# Patient Record
Sex: Female | Born: 1995
Health system: Southern US, Community
[De-identification: ages and names within clinical notes are randomized; demographics above are authoritative.]

## PROBLEM LIST (undated history)

## (undated) DIAGNOSIS — K219 Gastro-esophageal reflux disease without esophagitis: Secondary | ICD-10-CM

## (undated) DIAGNOSIS — G932 Benign intracranial hypertension: Secondary | ICD-10-CM

## (undated) DIAGNOSIS — IMO0001 Reserved for inherently not codable concepts without codable children: Secondary | ICD-10-CM

## (undated) DIAGNOSIS — K37 Unspecified appendicitis: Secondary | ICD-10-CM

## (undated) DIAGNOSIS — K529 Noninfective gastroenteritis and colitis, unspecified: Secondary | ICD-10-CM

## (undated) DIAGNOSIS — F419 Anxiety disorder, unspecified: Secondary | ICD-10-CM

## (undated) DIAGNOSIS — R109 Unspecified abdominal pain: Secondary | ICD-10-CM

## (undated) DIAGNOSIS — T7840XA Allergy, unspecified, initial encounter: Secondary | ICD-10-CM

## (undated) DIAGNOSIS — N946 Dysmenorrhea, unspecified: Secondary | ICD-10-CM

## (undated) DIAGNOSIS — K5010 Crohn's disease of large intestine without complications: Secondary | ICD-10-CM

## (undated) HISTORY — DX: Unspecified abdominal pain: R10.9

## (undated) HISTORY — DX: Gastro-esophageal reflux disease without esophagitis: K21.9

## (undated) HISTORY — DX: Benign intracranial hypertension: G93.2

## (undated) HISTORY — DX: Allergy, unspecified, initial encounter: T78.40XA

## (undated) HISTORY — DX: Dysmenorrhea, unspecified: N94.6

## (undated) HISTORY — PX: WISDOM TOOTH EXTRACTION: SHX21

---
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
## 2013-04-13 ENCOUNTER — Emergency Department (HOSPITAL_COMMUNITY)
Admission: EM | Admit: 2013-04-13 | Discharge: 2013-04-14 | Disposition: A | Discharge status: Home / Self Care | Payer: BC Managed Care – PPO | Attending: Emergency Medicine | Admitting: Emergency Medicine

## 2013-04-13 ENCOUNTER — Encounter (HOSPITAL_COMMUNITY): Payer: Self-pay | Admitting: *Deleted

## 2013-04-13 DIAGNOSIS — IMO0001 Reserved for inherently not codable concepts without codable children: Secondary | ICD-10-CM | POA: Insufficient documentation

## 2013-04-13 DIAGNOSIS — Z79899 Other long term (current) drug therapy: Secondary | ICD-10-CM | POA: Insufficient documentation

## 2013-04-13 DIAGNOSIS — K219 Gastro-esophageal reflux disease without esophagitis: Secondary | ICD-10-CM | POA: Insufficient documentation

## 2013-04-13 DIAGNOSIS — Z8719 Personal history of other diseases of the digestive system: Secondary | ICD-10-CM | POA: Insufficient documentation

## 2013-04-13 DIAGNOSIS — J029 Acute pharyngitis, unspecified: Secondary | ICD-10-CM | POA: Insufficient documentation

## 2013-04-13 DIAGNOSIS — R5381 Other malaise: Secondary | ICD-10-CM | POA: Insufficient documentation

## 2013-04-13 DIAGNOSIS — J3489 Other specified disorders of nose and nasal sinuses: Secondary | ICD-10-CM | POA: Insufficient documentation

## 2013-04-13 DIAGNOSIS — Z880 Allergy status to penicillin: Secondary | ICD-10-CM | POA: Insufficient documentation

## 2013-04-13 DIAGNOSIS — R51 Headache: Secondary | ICD-10-CM | POA: Insufficient documentation

## 2013-04-13 DIAGNOSIS — R197 Diarrhea, unspecified: Secondary | ICD-10-CM | POA: Insufficient documentation

## 2013-04-13 DIAGNOSIS — R1310 Dysphagia, unspecified: Secondary | ICD-10-CM | POA: Insufficient documentation

## 2013-04-13 DIAGNOSIS — R110 Nausea: Secondary | ICD-10-CM | POA: Insufficient documentation

## 2013-04-13 DIAGNOSIS — B349 Viral infection, unspecified: Secondary | ICD-10-CM

## 2013-04-13 DIAGNOSIS — Z8742 Personal history of other diseases of the female genital tract: Secondary | ICD-10-CM | POA: Insufficient documentation

## 2013-04-13 DIAGNOSIS — Z9089 Acquired absence of other organs: Secondary | ICD-10-CM | POA: Insufficient documentation

## 2013-04-13 DIAGNOSIS — B9789 Other viral agents as the cause of diseases classified elsewhere: Secondary | ICD-10-CM | POA: Insufficient documentation

## 2013-04-13 DIAGNOSIS — R599 Enlarged lymph nodes, unspecified: Secondary | ICD-10-CM | POA: Insufficient documentation

## 2013-04-13 NOTE — ED Notes (Signed)
Pt c/o flu-like sxs including generalized body aches, headache, sore throat, cough, runny nose.

## 2013-04-14 MED ORDER — HYDROCODONE-ACETAMINOPHEN 5-325 MG PO TABS: 1.0000 | ORAL_TABLET | ORAL | Status: DC | PRN

## 2013-04-14 MED ORDER — ONDANSETRON 8 MG PO TBDP: 8.0000 mg | ORAL_TABLET | Freq: Three times a day (TID) | ORAL | Status: DC | PRN

## 2013-04-14 NOTE — ED Provider Notes (Signed)
History     CSN: [id number]  Arrival date & time 04/13/13  2247   First MD Initiated Contact with Patient 04/14/13 0202      Chief Complaint  Patient presents with  . Influenza    (Consider location/radiation/quality/duration/timing/severity/associated sxs/prior treatment) Patient is a 17 y.o. female presenting with flu symptoms. The history is provided by the patient.  Influenza Presenting symptoms: cough, fatigue, headache, myalgias and sore throat   Presenting symptoms: no diarrhea, no nausea, no shortness of breath and no vomiting   Associated symptoms: no neck stiffness    patient states she's felt bad for last 5 days. She states that she has had sore throat and her muscles of her. She states her throat only hurts when she swallows. She states she has a throbbing frontal headache. She had diarrhea once. She has occasional cough. Mild nausea. She's had a good appetite. She denies possibility of pregnancy. She denies dysuria. No sick contacts. No confusion. Patient does not know if she's had a fever.  Past Medical History  Diagnosis Date  . GERD (gastroesophageal reflux disease)   . Allergy   . Dysmenorrhea   . Abdominal pain, recurrent   . Appendicitis     Past Surgical History  Procedure Laterality Date  . Appendectomy  07-14-11    laparoscopic   . Esophagogastroduodenoscopy  01/28/2012    Procedure: ESOPHAGOGASTRODUODENOSCOPY (EGD);  Surgeon: Oletha Blend, MD;  Location: [place];  Service: Gastroenterology;  Laterality: N/A;  . Wisdom tooth extraction    . Cholecystectomy  02/11/2012    Procedure: LAPAROSCOPIC CHOLECYSTECTOMY WITH INTRAOPERATIVE CHOLANGIOGRAM;  Surgeon: Gwenyth Ober, MD;  Location: [facility];  Service: General;  Laterality: N/A;    Family History  Problem Relation Age of Onset  . Cancer Paternal Aunt     ovarian, kidney  . Cancer Paternal Uncle     colon  . Cancer Maternal Grandmother     breast   . Hypertension Maternal Grandmother   . Hypertension  Paternal Grandmother   . Hyperlipidemia Paternal Grandmother     History  Substance Use Topics  . Smoking status: Never Smoker   . Smokeless tobacco: Never Used  . Alcohol Use: No    OB History   Grav Para Term Preterm Abortions TAB SAB Ect Mult Living                  Review of Systems  Constitutional: Positive for fatigue. Negative for activity change and appetite change.  HENT: Positive for sore throat and sinus pressure. Negative for trouble swallowing and neck stiffness.   Eyes: Negative for pain.  Respiratory: Positive for cough. Negative for chest tightness and shortness of breath.   Cardiovascular: Negative for chest pain and leg swelling.  Gastrointestinal: Negative for nausea, vomiting, abdominal pain and diarrhea.  Genitourinary: Negative for flank pain.  Musculoskeletal: Positive for myalgias. Negative for back pain.  Skin: Negative for rash.  Neurological: Positive for headaches. Negative for weakness and numbness.  Psychiatric/Behavioral: Negative for behavioral problems.    Allergies  Oxycontin and Penicillins  Home Medications   Current Outpatient Rx  Name  Route  Sig  Dispense  Refill  . cetirizine (ZYRTEC) 10 MG tablet   Oral   Take 10 mg by mouth daily as needed for allergies.         Marland Kitchen esomeprazole (NEXIUM) 40 MG capsule   Oral   Take 40 mg by mouth daily before breakfast.         .  Pseudoeph-Doxylamine-DM-APAP (NYQUIL PO)   Oral   Take 15 mLs by mouth at bedtime as needed (for cold symptoms).         Marland Kitchen HYDROcodone-acetaminophen (NORCO/VICODIN) 5-325 MG per tablet   Oral   Take 1 tablet by mouth every 4 (four) hours as needed for pain.   10 tablet   0   . ondansetron (ZOFRAN-ODT) 8 MG disintegrating tablet   Oral   Take 1 tablet (8 mg total) by mouth every 8 (eight) hours as needed for nausea.   10 tablet   0     BP 129/75  Pulse 62  Temp(Src) 98.3 F (36.8 C) (Oral)  Resp 18  SpO2 100%  LMP 04/11/2013  Physical Exam   Nursing note and vitals reviewed. Constitutional: She is oriented to person, place, and time. She appears well-developed and well-nourished.  HENT:  Head: Normocephalic and atraumatic.  Mouth/Throat: No oropharyngeal exudate.  Posterior pharyngeal erythema and some swelling. No exudate. Uvula midline  Eyes: EOM are normal. Pupils are equal, round, and reactive to light.  Neck: Normal range of motion. Neck supple.  No meningeal signs.  Cardiovascular: Normal rate, regular rhythm and normal heart sounds.   No murmur heard. Pulmonary/Chest: Effort normal and breath sounds normal. No respiratory distress. She has no wheezes. She has no rales.  Abdominal: Soft. Bowel sounds are normal. She exhibits no distension. There is no tenderness. There is no rebound and no guarding.  Musculoskeletal: Normal range of motion.  Lymphadenopathy:    She has cervical adenopathy.  Neurological: She is alert and oriented to person, place, and time. No cranial nerve deficit.  Skin: Skin is warm and dry.  Psychiatric: She has a normal mood and affect. Her speech is normal.    ED Course  Procedures (including critical care time)  Labs Reviewed  RAPID STREP SCREEN  CULTURE, GROUP A STREP   No results found.   1. Viral infection       MDM  Patient presents with sore throat and myalgias. Strep is negative. She is well-appearing. She'll be discharged home. She denies possibility of pregnancy.        Jasper Riling. Alvino Chapel, MD 04/14/13 [phone]

## 2013-04-16 LAB — CULTURE, GROUP A STREP

## 2013-06-27 ENCOUNTER — Encounter (HOSPITAL_COMMUNITY): Payer: Self-pay | Admitting: Emergency Medicine

## 2013-06-27 ENCOUNTER — Emergency Department (HOSPITAL_COMMUNITY)
Admission: EM | Admit: 2013-06-27 | Discharge: 2013-06-28 | Disposition: A | Discharge status: Home / Self Care | Payer: BC Managed Care – PPO | Attending: Emergency Medicine | Admitting: Emergency Medicine

## 2013-06-27 DIAGNOSIS — R110 Nausea: Secondary | ICD-10-CM | POA: Insufficient documentation

## 2013-06-27 DIAGNOSIS — Z9089 Acquired absence of other organs: Secondary | ICD-10-CM | POA: Insufficient documentation

## 2013-06-27 DIAGNOSIS — R197 Diarrhea, unspecified: Secondary | ICD-10-CM | POA: Insufficient documentation

## 2013-06-27 DIAGNOSIS — Z79899 Other long term (current) drug therapy: Secondary | ICD-10-CM | POA: Insufficient documentation

## 2013-06-27 DIAGNOSIS — K219 Gastro-esophageal reflux disease without esophagitis: Secondary | ICD-10-CM | POA: Insufficient documentation

## 2013-06-27 DIAGNOSIS — Z3202 Encounter for pregnancy test, result negative: Secondary | ICD-10-CM | POA: Insufficient documentation

## 2013-06-27 DIAGNOSIS — R630 Anorexia: Secondary | ICD-10-CM | POA: Insufficient documentation

## 2013-06-27 DIAGNOSIS — R10817 Generalized abdominal tenderness: Secondary | ICD-10-CM | POA: Insufficient documentation

## 2013-06-27 DIAGNOSIS — N83209 Unspecified ovarian cyst, unspecified side: Secondary | ICD-10-CM | POA: Insufficient documentation

## 2013-06-27 DIAGNOSIS — Z8742 Personal history of other diseases of the female genital tract: Secondary | ICD-10-CM | POA: Insufficient documentation

## 2013-06-27 DIAGNOSIS — Z8719 Personal history of other diseases of the digestive system: Secondary | ICD-10-CM | POA: Insufficient documentation

## 2013-06-27 DIAGNOSIS — Z880 Allergy status to penicillin: Secondary | ICD-10-CM | POA: Insufficient documentation

## 2013-06-27 DIAGNOSIS — Z9889 Other specified postprocedural states: Secondary | ICD-10-CM | POA: Insufficient documentation

## 2013-06-27 NOTE — ED Notes (Signed)
Pt c/o LLQ abd pain intermittent x 4 days. Denies GYN s/s, denies urinary s/s

## 2013-06-28 ENCOUNTER — Encounter (HOSPITAL_COMMUNITY): Payer: Self-pay | Admitting: Radiology

## 2013-06-28 ENCOUNTER — Emergency Department (HOSPITAL_COMMUNITY): Payer: BC Managed Care – PPO

## 2013-06-28 LAB — URINALYSIS, ROUTINE W REFLEX MICROSCOPIC
Hgb urine dipstick: NEGATIVE
Leukocytes, UA: NEGATIVE
Nitrite: NEGATIVE
Protein, ur: NEGATIVE mg/dL
Specific Gravity, Urine: 1.034 — ABNORMAL HIGH (ref 1.005–1.030)
Urobilinogen, UA: 2 mg/dL — ABNORMAL HIGH (ref 0.0–1.0)

## 2013-06-28 LAB — CBC
HCT: 38 % (ref 36.0–46.0)
MCHC: 34.5 g/dL (ref 30.0–36.0)
Platelets: 239 10*3/uL (ref 150–400)
RDW: 12.2 % (ref 11.5–15.5)
WBC: 5.9 10*3/uL (ref 4.0–10.5)

## 2013-06-28 LAB — COMPREHENSIVE METABOLIC PANEL
AST: 12 U/L (ref 0–37)
Albumin: 3.6 g/dL (ref 3.5–5.2)
Alkaline Phosphatase: 73 U/L (ref 39–117)
BUN: 9 mg/dL (ref 6–23)
Chloride: 107 mEq/L (ref 96–112)
Potassium: 3.3 mEq/L — ABNORMAL LOW (ref 3.5–5.1)
Sodium: 138 mEq/L (ref 135–145)
Total Bilirubin: 0.3 mg/dL (ref 0.3–1.2)
Total Protein: 7.4 g/dL (ref 6.0–8.3)

## 2013-06-28 LAB — LIPASE, BLOOD: Lipase: 32 U/L (ref 11–59)

## 2013-06-28 LAB — PREGNANCY, URINE: Preg Test, Ur: NEGATIVE

## 2013-06-28 MED ORDER — ONDANSETRON HCL 4 MG/2ML IJ SOLN
4.0000 mg | Freq: Once | INTRAMUSCULAR | Status: AC
  Administered 2013-06-28: 4 mg via INTRAVENOUS
  Filled 2013-06-28: qty 2

## 2013-06-28 MED ORDER — HYDROMORPHONE HCL PF 1 MG/ML IJ SOLN
1.0000 mg | Freq: Once | INTRAMUSCULAR | Status: AC
  Administered 2013-06-28: 1 mg via INTRAVENOUS
  Filled 2013-06-28: qty 1

## 2013-06-28 MED ORDER — IOHEXOL 300 MG/ML  SOLN
50.0000 mL | Freq: Once | INTRAMUSCULAR | Status: AC | PRN
  Administered 2013-06-28: 50 mL via ORAL

## 2013-06-28 MED ORDER — HYDROCODONE-ACETAMINOPHEN 5-325 MG PO TABS: 1.0000 | ORAL_TABLET | Freq: Four times a day (QID) | ORAL | Status: DC | PRN

## 2013-06-28 MED ORDER — SODIUM CHLORIDE 0.9 % IV SOLN
Freq: Once | INTRAVENOUS | Status: AC
  Administered 2013-06-28: 02:00:00 via INTRAVENOUS

## 2013-06-28 MED ORDER — HYDROCODONE-ACETAMINOPHEN 5-325 MG PO TABS
1.0000 | ORAL_TABLET | Freq: Once | ORAL | Status: AC
  Administered 2013-06-28: 1 via ORAL
  Filled 2013-06-28: qty 1

## 2013-06-28 MED ORDER — IOHEXOL 300 MG/ML  SOLN
100.0000 mL | Freq: Once | INTRAMUSCULAR | Status: AC | PRN
  Administered 2013-06-28: 100 mL via INTRAVENOUS

## 2013-06-28 MED ORDER — ONDANSETRON 4 MG PO TBDP: 4.0000 mg | ORAL_TABLET | Freq: Three times a day (TID) | ORAL | Status: DC | PRN

## 2013-06-28 NOTE — ED Provider Notes (Signed)
Medical screening examination/treatment/procedure(s) were performed by non-physician practitioner and as supervising physician I was immediately available for consultation/collaboration.  Teressa Lower, MD 06/28/13 [phone]

## 2013-06-28 NOTE — ED Provider Notes (Signed)
CSN: [id number]     Arrival date & time 06/27/13  2316 History     First MD Initiated Contact with Patient 06/28/13 0055     Chief Complaint  Patient presents with  . Abdominal Pain   (Consider location/radiation/quality/duration/timing/severity/associated sxs/prior Treatment) HPI Comments: Patient with a history of left lower quadrant pain for the past 4, days.  Has tried over-the-counter ibuprofen, without any relief.  She reports, that she did have a day of diarrhea on Saturday, but normal bowel movements.  On Monday, and this morning.  She has had a cholecystectomy in the past, as well as an appendectomy.  She does have a history of GERD, and chronic, recurrent abdominal pain  Patient is a 17 y.o. female presenting with abdominal pain. The history is provided by the patient.  Abdominal Pain Pain location:  LLQ Pain quality: aching, fullness and sharp   Pain radiates to:  Does not radiate Pain severity:  Severe Onset quality:  Gradual Duration:  4 days Timing:  Intermittent Progression:  Worsening Chronicity:  New Context: eating and previous surgery   Context: not retching   Relieved by:  None tried Worsened by:  Nothing tried Ineffective treatments:  None tried Associated symptoms: nausea   Associated symptoms: no chest pain, no constipation, no diarrhea, no dysuria, no fever, no flatus, no shortness of breath, no vaginal bleeding, no vaginal discharge and no vomiting     Past Medical History  Diagnosis Date  . GERD (gastroesophageal reflux disease)   . Allergy   . Dysmenorrhea   . Abdominal pain, recurrent   . Appendicitis    Past Surgical History  Procedure Laterality Date  . Appendectomy  07-14-11    laparoscopic   . Esophagogastroduodenoscopy  01/28/2012    Procedure: ESOPHAGOGASTRODUODENOSCOPY (EGD);  Surgeon: Oletha Blend, MD;  Location: [facility];  Service: Gastroenterology;  Laterality: N/A;  . Wisdom tooth extraction    . Cholecystectomy  02/11/2012   Procedure: LAPAROSCOPIC CHOLECYSTECTOMY WITH INTRAOPERATIVE CHOLANGIOGRAM;  Surgeon: Gwenyth Ober, MD;  Location: [facility];  Service: General;  Laterality: N/A;   Family History  Problem Relation Age of Onset  . Cancer Paternal Aunt     ovarian, kidney  . Cancer Paternal Uncle     colon  . Cancer Maternal Grandmother     breast   . Hypertension Maternal Grandmother   . Hypertension Paternal Grandmother   . Hyperlipidemia Paternal Grandmother    History  Substance Use Topics  . Smoking status: Never Smoker   . Smokeless tobacco: Never Used  . Alcohol Use: Yes     Comment: occasional    OB History   Grav Para Term Preterm Abortions TAB SAB Ect Mult Living                 Review of Systems  Constitutional: Positive for appetite change. Negative for fever and activity change.  Respiratory: Negative for shortness of breath.   Cardiovascular: Negative for chest pain.  Gastrointestinal: Positive for nausea and abdominal pain. Negative for vomiting, diarrhea, constipation and flatus.  Genitourinary: Negative for dysuria, vaginal bleeding and vaginal discharge.  Musculoskeletal: Negative for back pain.  Skin: Negative for rash and wound.  Neurological: Negative for dizziness and headaches.  All other systems reviewed and are negative.    Allergies  Oxycontin and Penicillins  Home Medications   Current Outpatient Rx  Name  Route  Sig  Dispense  Refill  . esomeprazole (NEXIUM) 40 MG capsule   Oral  Take 40 mg by mouth daily before breakfast.         . HYDROcodone-acetaminophen (NORCO/VICODIN) 5-325 MG per tablet   Oral   Take 1 tablet by mouth every 6 (six) hours as needed for pain.   30 tablet   0   . ondansetron (ZOFRAN ODT) 4 MG disintegrating tablet   Oral   Take 1 tablet (4 mg total) by mouth every 8 (eight) hours as needed for nausea.   20 tablet   0    BP 133/69  Pulse 73  Temp(Src) 98.9 F (37.2 C) (Oral)  Resp 16  Ht 5' 7"  (1.702 m)  Wt 189 lb  (85.73 kg)  BMI 29.59 kg/m2  SpO2 100%  LMP 06/13/2013 Physical Exam  Nursing note and vitals reviewed. Constitutional: She is oriented to person, place, and time. She appears well-developed and well-nourished.  HENT:  Head: Normocephalic.  Eyes: Pupils are equal, round, and reactive to light.  Neck: Normal range of motion.  Cardiovascular: Normal rate and regular rhythm.   Pulmonary/Chest: Effort normal and breath sounds normal.  Abdominal: Soft. Bowel sounds are normal. She exhibits no distension. There is generalized tenderness. There is no rebound and no guarding.  Musculoskeletal: Normal range of motion.  Neurological: She is alert and oriented to person, place, and time.  Skin: Skin is warm. No rash noted.  Psychiatric:  Flat affect    ED Course   Procedures (including critical care time)  Labs Reviewed  URINALYSIS, ROUTINE W REFLEX MICROSCOPIC - Abnormal; Notable for the following:    Specific Gravity, Urine 1.034 (*)    Urobilinogen, UA 2.0 (*)    All other components within normal limits  COMPREHENSIVE METABOLIC PANEL - Abnormal; Notable for the following:    Potassium 3.3 (*)    All other components within normal limits  PREGNANCY, URINE  CBC  LIPASE, BLOOD   Ct Abdomen Pelvis W Contrast  06/28/2013   *RADIOLOGY REPORT*  Clinical Data: Lower quadrant abdominal pain.  CT ABDOMEN AND PELVIS WITH CONTRAST  Technique:  Multidetector CT imaging of the abdomen and pelvis was performed following the standard protocol during bolus administration of intravenous contrast.  Contrast: 174m OMNIPAQUE IOHEXOL 300 MG/ML  SOLN  Comparison: CT of the abdomen and pelvis 07/13/2011.  Findings:  Lung Bases: Unremarkable.  Abdomen/Pelvis:  Status post cholecystectomy.  The appearance of the liver, pancreas, spleen, bilateral adrenal glands and bilateral kidneys is unremarkable.  Status post appendectomy.  Small volume of free fluid the cul-de-sac presumably physiologic in this young  female patient.  A degenerating corpus luteum cyst in the right ovary.  Uterus and left ovary are unremarkable in appearance.  No pneumoperitoneum.  No pathologic distension of small bowel.  No definite pathologic lymphadenopathy identified within the abdomen or pelvis on today's examination. Urinary bladder is normal in appearance.  Musculoskeletal: There are no aggressive appearing lytic or blastic lesions noted in the visualized portions of the skeleton.  IMPRESSION: 1.  No definite acute findings in the abdomen or pelvis to account for the patient's symptoms. 2.  Status post appendectomy and cholecystectomy. 3.  Degenerating corpus luteum cyst in the right ovary with a small volume of free fluid the cul-de-sac which is presumably physiologic.   Original Report Authenticated By: DVinnie Langton M.D.   1. Ovarian cyst     MDM   CT scan, shows that she has a ruptured corpus luteum on the right is most likely the cause of her pain.  She  does note urinary tract, infection, or any sign of systemic effect  Garald Balding, NP 06/28/13 0423

## 2013-07-04 ENCOUNTER — Emergency Department (HOSPITAL_COMMUNITY)
Admission: EM | Admit: 2013-07-04 | Discharge: 2013-07-04 | Disposition: A | Discharge status: Home / Self Care | Payer: BC Managed Care – PPO | Attending: Emergency Medicine | Admitting: Emergency Medicine

## 2013-07-04 ENCOUNTER — Encounter (HOSPITAL_COMMUNITY): Payer: Self-pay | Admitting: Emergency Medicine

## 2013-07-04 ENCOUNTER — Telehealth: Payer: Self-pay | Admitting: Family Medicine

## 2013-07-04 DIAGNOSIS — A09 Infectious gastroenteritis and colitis, unspecified: Secondary | ICD-10-CM

## 2013-07-04 DIAGNOSIS — Z8709 Personal history of other diseases of the respiratory system: Secondary | ICD-10-CM | POA: Insufficient documentation

## 2013-07-04 DIAGNOSIS — K921 Melena: Secondary | ICD-10-CM | POA: Insufficient documentation

## 2013-07-04 DIAGNOSIS — Z792 Long term (current) use of antibiotics: Secondary | ICD-10-CM | POA: Insufficient documentation

## 2013-07-04 DIAGNOSIS — K5289 Other specified noninfective gastroenteritis and colitis: Secondary | ICD-10-CM | POA: Insufficient documentation

## 2013-07-04 DIAGNOSIS — Z880 Allergy status to penicillin: Secondary | ICD-10-CM | POA: Insufficient documentation

## 2013-07-04 DIAGNOSIS — K219 Gastro-esophageal reflux disease without esophagitis: Secondary | ICD-10-CM | POA: Insufficient documentation

## 2013-07-04 DIAGNOSIS — Z3202 Encounter for pregnancy test, result negative: Secondary | ICD-10-CM | POA: Insufficient documentation

## 2013-07-04 DIAGNOSIS — Z8719 Personal history of other diseases of the digestive system: Secondary | ICD-10-CM | POA: Insufficient documentation

## 2013-07-04 DIAGNOSIS — R112 Nausea with vomiting, unspecified: Secondary | ICD-10-CM | POA: Insufficient documentation

## 2013-07-04 DIAGNOSIS — Z8742 Personal history of other diseases of the female genital tract: Secondary | ICD-10-CM | POA: Insufficient documentation

## 2013-07-04 DIAGNOSIS — R109 Unspecified abdominal pain: Secondary | ICD-10-CM | POA: Insufficient documentation

## 2013-07-04 LAB — CBC WITH DIFFERENTIAL/PLATELET
Basophils Absolute: 0 10*3/uL (ref 0.0–0.1)
Basophils Relative: 1 % (ref 0–1)
Eosinophils Absolute: 0 10*3/uL (ref 0.0–0.7)
Eosinophils Relative: 1 % (ref 0–5)
Lymphs Abs: 1.5 10*3/uL (ref 0.7–4.0)
MCH: 30.6 pg (ref 26.0–34.0)
MCHC: 35.4 g/dL (ref 30.0–36.0)
MCV: 86.4 fL (ref 78.0–100.0)
Monocytes Relative: 11 % (ref 3–12)
Neutro Abs: 2.1 10*3/uL (ref 1.7–7.7)
Neutrophils Relative %: 51 % (ref 43–77)
Platelets: 260 10*3/uL (ref 150–400)
RBC: 4.35 MIL/uL (ref 3.87–5.11)
RDW: 11.9 % (ref 11.5–15.5)
WBC: 4 10*3/uL (ref 4.0–10.5)

## 2013-07-04 LAB — COMPREHENSIVE METABOLIC PANEL
ALT: 7 U/L (ref 0–35)
Albumin: 3.9 g/dL (ref 3.5–5.2)
BUN: 9 mg/dL (ref 6–23)
Calcium: 9.2 mg/dL (ref 8.4–10.5)
GFR calc Af Amer: >90 mL/min (ref >90)
Glucose, Bld: 95 mg/dL (ref 70–99)
Potassium: 2.8 mEq/L — ABNORMAL LOW (ref 3.5–5.1)
Sodium: 137 mEq/L (ref 135–145)
Total Protein: 7.9 g/dL (ref 6.0–8.3)

## 2013-07-04 LAB — URINALYSIS, ROUTINE W REFLEX MICROSCOPIC
Nitrite: NEGATIVE
Specific Gravity, Urine: 1.033 — ABNORMAL HIGH (ref 1.005–1.030)
Urobilinogen, UA: 1 mg/dL (ref 0.0–1.0)
pH: 5.5 (ref 5.0–8.0)

## 2013-07-04 LAB — POCT PREGNANCY, URINE: Preg Test, Ur: NEGATIVE

## 2013-07-04 LAB — SEDIMENTATION RATE: Sed Rate: 31 mm/hr — ABNORMAL HIGH (ref 0–22)

## 2013-07-04 LAB — LIPASE, BLOOD: Lipase: 30 U/L (ref 11–59)

## 2013-07-04 MED ORDER — HYDROCODONE-ACETAMINOPHEN 5-325 MG PO TABS: 2.0000 | ORAL_TABLET | ORAL | Status: DC | PRN

## 2013-07-04 MED ORDER — ONDANSETRON HCL 4 MG/2ML IJ SOLN
4.0000 mg | Freq: Once | INTRAMUSCULAR | Status: DC
  Filled 2013-07-04: qty 2

## 2013-07-04 MED ORDER — GLYCOPYRROLATE 0.2 MG/ML IJ SOLN
0.2000 mg | Freq: Once | INTRAMUSCULAR | Status: DC
  Filled 2013-07-04: qty 1

## 2013-07-04 MED ORDER — MORPHINE SULFATE 4 MG/ML IJ SOLN
4.0000 mg | INTRAMUSCULAR | Status: DC | PRN
  Filled 2013-07-04: qty 1

## 2013-07-04 MED ORDER — CIPROFLOXACIN HCL 500 MG PO TABS: 500.0000 mg | ORAL_TABLET | Freq: Two times a day (BID) | ORAL | Status: DC

## 2013-07-04 MED ORDER — METRONIDAZOLE 500 MG PO TABS: 500.0000 mg | ORAL_TABLET | Freq: Two times a day (BID) | ORAL | Status: DC

## 2013-07-04 MED ORDER — SODIUM CHLORIDE 0.9 % IV BOLUS (SEPSIS): 1000.0000 mL | Freq: Once | INTRAVENOUS | Status: DC

## 2013-07-04 MED ORDER — ONDANSETRON HCL 4 MG PO TABS: 4.0000 mg | ORAL_TABLET | Freq: Four times a day (QID) | ORAL | Status: DC

## 2013-07-04 MED ORDER — PROMETHAZINE HCL 25 MG/ML IJ SOLN
12.5000 mg | Freq: Once | INTRAMUSCULAR | Status: AC
  Administered 2013-07-04: 12.5 mg via INTRAMUSCULAR
  Filled 2013-07-04 (×2): qty 1

## 2013-07-04 MED ORDER — MORPHINE SULFATE 4 MG/ML IJ SOLN
8.0000 mg | Freq: Once | INTRAMUSCULAR | Status: AC
  Administered 2013-07-04: 8 mg via INTRAMUSCULAR
  Filled 2013-07-04: qty 1

## 2013-07-04 NOTE — ED Notes (Signed)
MD at bedside. 

## 2013-07-04 NOTE — Telephone Encounter (Signed)
Patient Information:  Caller Name: Mliss Sax  Phone: [phone]  Patient: Julie Ward, Julie Ward  Gender: Female  DOB: 09/18/1996  Age: 17 Years  PCP: Alysia Penna [facility])  Pregnant: No  Office Follow Up:  Does the office need to follow up with this patient?: No  Instructions For The Office: N/A  RN Note:  Not sexually active. ED sonogram confirmed ruptured right ovarian cyst 06/27/13. Grandmother called to request different pain medication  for ongoing abdominal pain.  Alyne reluctant to go to 1st day of college today because it is painful to walk; abdomen remains sore/tender to touch.  Abdominal pain not decreasing.  Stopped using Hydrocodone due to nausea.  Was unable to sleep last night. Elexis not present for triage;in classes until 1800. Had mild diarrhea 07/03/13; child noted possible small amount of blood in stools with mucus. Grandmother does not know if blood still seen in stool today since she is calling from out of state.  Advised to check with Jream by text; if blood in stool or constant abdominal pain > 2 hours, then needs to be seen now in ED.  Symptoms  Reason For Call & Symptoms: Ongoing right lower abominal pain; Diagnosed with ruptured ovarian cyst in [facility] ED 06/27/13.  Hydrocodone is not helping.  Reviewed Health History In EMR: Yes  Reviewed Medications In EMR: Yes  Reviewed Allergies In EMR: Yes  Reviewed Surgeries / Procedures: Yes  Date of Onset of Symptoms: 06/27/2013  Treatments Tried: Hydrocodone for pain  Treatments Tried Worked: No OB / GYN:  LMP: 06/13/2013  Guideline(s) Used:  Abdominal Pain - Female  Disposition Per Guideline:   Go to ED Now  Reason For Disposition Reached:   Bloody, black, or tarry bowel movements  Advice Given:  N/A  Patient Will Follow Care Advice:  YES

## 2013-07-04 NOTE — ED Notes (Signed)
Pt reports she is difficult stick and is refusing IV start in certain locations.  Charge Julie Ward will attempt to start IV

## 2013-07-04 NOTE — ED Notes (Signed)
Pt states that she has been here for LLQ pain before.  States that everything feels the same as it was before.  C/o diarrhea.  Pain has been going on since August 8.

## 2013-07-04 NOTE — ED Provider Notes (Addendum)
CSN: [id number]     Arrival date & time 07/04/13  1316 History     First MD Initiated Contact with Patient 07/04/13 1504     Chief Complaint  Patient presents with  . Abdominal Pain    HPI   Julie Ward presents with abdominal pain. She states initially that this episode started on August 8. In review she has had difficult abdominal pain for many years. She was seeing a gastroenterologist over several years. Hopefully had a cholecystectomy. When the mom she also "had to have a little bit of her small intestine taken out. She had her appendix removed. Following these procedures she did have episodes where she felt well but her pain has recurred sounds at this may be that of chronic pain. She's never had a colonoscopy. She's never had an EGD. Her pain started on the eighth. Seen here by the 13th. Had a CT scan showing only a trace of pelvic fluid. Discharge is supple pain control. States she may felt better for a few days. Her pain has recurred and worsened. It is diffuse. She normally has a bowel movement in one or 2 times per week. For the last week she's had diarrhea "all day long". She noticed some "yellow stuff in blood" in her stools last night. Has never been diagnosed or evaluated for possible inflammatory bowel disease. She has an aunt and grandmother with ulcerative colitis she is on birth control. Her last menstrual period ended 4 days ago and was normal for her. Past Medical History  Diagnosis Date  . GERD (gastroesophageal reflux disease)   . Allergy   . Dysmenorrhea   . Abdominal pain, recurrent   . Appendicitis    Past Surgical History  Procedure Laterality Date  . Appendectomy  07-14-11    laparoscopic   . Esophagogastroduodenoscopy  01/28/2012    Procedure: ESOPHAGOGASTRODUODENOSCOPY (EGD);  Surgeon: Oletha Blend, MD;  Location: [facility];  Service: Gastroenterology;  Laterality: N/A;  . Wisdom tooth extraction    . Cholecystectomy  02/11/2012    Procedure: LAPAROSCOPIC  CHOLECYSTECTOMY WITH INTRAOPERATIVE CHOLANGIOGRAM;  Surgeon: Gwenyth Ober, MD;  Location: [place];  Service: General;  Laterality: N/A;   Family History  Problem Relation Age of Onset  . Cancer Paternal Aunt     ovarian, kidney  . Cancer Paternal Uncle     colon  . Cancer Maternal Grandmother     breast   . Hypertension Maternal Grandmother   . Hypertension Paternal Grandmother   . Hyperlipidemia Paternal Grandmother    History  Substance Use Topics  . Smoking status: Never Smoker   . Smokeless tobacco: Never Used  . Alcohol Use: Yes     Comment: occasional    OB History   Grav Para Term Preterm Abortions TAB SAB Ect Mult Living                 Review of Systems  Constitutional: Negative for fever, chills, diaphoresis, appetite change and fatigue.        she's been less active last few days because of her discomfort  HENT: Negative for sore throat, mouth sores and trouble swallowing.   Eyes: Negative for visual disturbance.  Respiratory: Negative for cough, chest tightness, shortness of breath and wheezing.   Cardiovascular: Negative for chest pain.  Gastrointestinal: Positive for nausea, vomiting, abdominal pain and blood in stool. Negative for diarrhea and abdominal distention.       Bloody stools. Yellow stools.  Endocrine: Negative for polydipsia,  polyphagia and polyuria.  Genitourinary: Negative for dysuria, frequency and hematuria.       No GU complaints. Does not see an OB/GYN. Mom states that her primary care doctor told her to go see her GYN. She states she saw her primary care physician Dr. Velna Hatchet in the middle of the summer because of episodes of abdominal pain and was told to see GYN. She has not  Musculoskeletal: Negative for gait problem.  Skin: Negative for color change, pallor and rash.  Neurological: Negative for dizziness, syncope, light-headedness and headaches.  Hematological: Does not bruise/bleed easily.  Psychiatric/Behavioral: Negative for behavioral  problems and confusion.    Allergies  Oxycontin and Penicillins  Home Medications   Current Outpatient Rx  Name  Route  Sig  Dispense  Refill  . esomeprazole (NEXIUM) 40 MG capsule   Oral   Take 40 mg by mouth daily before breakfast.         . HYDROcodone-acetaminophen (NORCO/VICODIN) 5-325 MG per tablet   Oral   Take 1 tablet by mouth every 6 (six) hours as needed for pain.   30 tablet   0   . ondansetron (ZOFRAN ODT) 4 MG disintegrating tablet   Oral   Take 1 tablet (4 mg total) by mouth every 8 (eight) hours as needed for nausea.   20 tablet   0   . ciprofloxacin (CIPRO) 500 MG tablet   Oral   Take 1 tablet (500 mg total) by mouth every 12 (twelve) hours.   10 tablet   0   . HYDROcodone-acetaminophen (NORCO/VICODIN) 5-325 MG per tablet   Oral   Take 2 tablets by mouth every 4 (four) hours as needed for pain.   10 tablet   0   . metroNIDAZOLE (FLAGYL) 500 MG tablet   Oral   Take 1 tablet (500 mg total) by mouth 2 (two) times daily.   14 tablet   0   . ondansetron (ZOFRAN) 4 MG tablet   Oral   Take 1 tablet (4 mg total) by mouth every 6 (six) hours.   12 tablet   0    BP 111/61  Pulse 111  Temp(Src) 98.3 F (36.8 C) (Oral)  Resp 16  SpO2 96%  LMP 06/13/2013 Physical Exam  Constitutional:  89 oh female sitting upright does not appear distressed  Eyes:  Conjunctiva not pale. Sclera anicteric.  Abdominal:  She complains of primarily upper abdominal pain and tenderness. She does not have peritoneal irritation.    ED Course   Procedures (including critical care time)  Labs Reviewed  COMPREHENSIVE METABOLIC PANEL - Abnormal; Notable for the following:    Potassium 2.8 (*)    All other components within normal limits  URINALYSIS, ROUTINE W REFLEX MICROSCOPIC - Abnormal; Notable for the following:    Color, Urine AMBER (*)    APPearance CLOUDY (*)    Specific Gravity, Urine 1.033 (*)    Bilirubin Urine SMALL (*)    Ketones, ur 40 (*)    All  other components within normal limits  SEDIMENTATION RATE - Abnormal; Notable for the following:    Sed Rate 31 (*)    All other components within normal limits  STOOL CULTURE  CBC WITH DIFFERENTIAL  LIPASE, BLOOD  POCT PREGNANCY, URINE   No results found. 1. Abdominal pain   2. Infectious colitis     MDM  This is an acute exacerbation of chronic recurrent pain. She's had multiple surgical procedures multiple valuations and most  recently normal CT scan last 1 week ago. Localization of pain or peritoneal irritation that that would lead me to believe she needs additional imaging at this time. If the blood in her stools this may be an infectious colitis although there is no colitis or thickening noted on CT. The symptoms have become evident more than a week later. Inflammatory bowel disease is a possibility. She is a family history of this.  Tablets and is not elevated. Sedimentation rate is minimally elevated at 31. Considering the blood in stools episode of pain this may be infectious colitis. She was unable to give a stool sample here. She IM pain medications or unable to obtain IV. Her pain is improving. Recommended she see GI in follow up for possible endoscopy, considering a family  history of colitis, bloody stools, and elevated sedimentation rate.   Discussed the low potassium. She is given a prescription for 10 days 3 times a day potassium as well Lolita Patella, MD 07/04/13 1801  Lolita Patella, MD 07/04/13 [phone]

## 2013-07-04 NOTE — Telephone Encounter (Signed)
ED Notification 

## 2013-07-05 ENCOUNTER — Encounter: Payer: Self-pay | Admitting: Internal Medicine

## 2013-07-06 ENCOUNTER — Other Ambulatory Visit: Payer: Self-pay | Admitting: Gynecology

## 2013-07-06 ENCOUNTER — Encounter: Payer: Self-pay | Admitting: Gynecology

## 2013-07-06 ENCOUNTER — Ambulatory Visit (INDEPENDENT_AMBULATORY_CARE_PROVIDER_SITE_OTHER): Payer: BC Managed Care – PPO | Admitting: Gastroenterology

## 2013-07-06 ENCOUNTER — Ambulatory Visit: Payer: BC Managed Care – PPO

## 2013-07-06 ENCOUNTER — Encounter: Payer: Self-pay | Admitting: Gastroenterology

## 2013-07-06 ENCOUNTER — Ambulatory Visit (INDEPENDENT_AMBULATORY_CARE_PROVIDER_SITE_OTHER): Payer: BC Managed Care – PPO | Admitting: Gynecology

## 2013-07-06 VITALS — BP 100/62 | HR 74 | Ht 67.0 in | Wt 208.1 lb

## 2013-07-06 VITALS — BP 122/76 | Ht 67.0 in | Wt 206.0 lb

## 2013-07-06 DIAGNOSIS — N83209 Unspecified ovarian cyst, unspecified side: Secondary | ICD-10-CM

## 2013-07-06 DIAGNOSIS — R109 Unspecified abdominal pain: Secondary | ICD-10-CM

## 2013-07-06 DIAGNOSIS — K921 Melena: Secondary | ICD-10-CM

## 2013-07-06 DIAGNOSIS — E876 Hypokalemia: Secondary | ICD-10-CM

## 2013-07-06 DIAGNOSIS — K5900 Constipation, unspecified: Secondary | ICD-10-CM

## 2013-07-06 LAB — CBC WITH DIFFERENTIAL/PLATELET
Eosinophils Absolute: 0.1 10*3/uL (ref 0.0–0.7)
Eosinophils Relative: 2 % (ref 0–5)
Lymphocytes Relative: 47 % — ABNORMAL HIGH (ref 12–46)
Lymphs Abs: 1.8 10*3/uL (ref 0.7–4.0)
Monocytes Absolute: 0.3 10*3/uL (ref 0.1–1.0)
Monocytes Relative: 8 % (ref 3–12)
WBC: 3.8 10*3/uL — ABNORMAL LOW (ref 4.0–10.5)

## 2013-07-06 MED ORDER — POTASSIUM CHLORIDE CRYS ER 20 MEQ PO TBCR: 20.0000 meq | EXTENDED_RELEASE_TABLET | Freq: Two times a day (BID) | ORAL | Status: DC

## 2013-07-06 MED ORDER — NORETHINDRONE ACET-ETHINYL EST 1-20 MG-MCG PO TABS: 1.0000 | ORAL_TABLET | Freq: Every day | ORAL | Status: DC

## 2013-07-06 MED ORDER — TRAMADOL HCL 50 MG PO TABS: 50.0000 mg | ORAL_TABLET | Freq: Four times a day (QID) | ORAL | Status: DC | PRN

## 2013-07-06 NOTE — Patient Instructions (Signed)
Start on potassium supplement 20 mEq twice daily for one week to build up your potassium level quickly and then once daily to maintain it and we will recheck your potassium level of the next several weeks. Followup for ultrasound. Use Ultram pain medication as needed

## 2013-07-06 NOTE — Patient Instructions (Addendum)
We referred you to [facility]  Your appointment is for today 07-06-2013 at 3:20 pm be there 15 minutes early for registration.   Address and phone number is below   [address] [place]  [place], [place] [address] [phone]

## 2013-07-06 NOTE — Progress Notes (Signed)
  History of Present Illness:  This is a 17 year old black female with a history of chronic IBS who has had cholecystectomy for suspected gallbladder dysfunction, appendectomy, and he recently had a ruptured ovarian cyst dissected in the emergency room.  While she is referred to gastroenterology is unclear.  She's been free of pain for the last year after her surgery but had sudden onset of pain several days ago, and apparently had some bloody mucus in her stool at that time.  Since that time she's been completely constipated.  She denies chronic diarrhea or chronic rectal bleeding..  She has chronic mild constipation but denies rectal bleeding, or any upper GI or hepatobiliary complaints.  Exam she is with her mother and is weeping throughout the procedure.  View of her labs and x-ray showed no abnormalities.  There is some pelvic fluid from what appears to be a ovarian cyst.  Patient denies systemic complaints, use of alcohol cigarettes or NSAIDs.  Is been no anorexia or weight loss.  I have reviewed this patient's present history, medical and surgical past history, allergies and medications.     ROS:   All systems were reviewed and are negative unless otherwise stated in the HPI.    Physical Exam: Blood pressure over 62, pulse 74 and regular, and weight 208 with BMI of 32.59.  She is a healthy-appearing female in no distress.  Numerous tattoos  obvious General well developed well nourished patient in no acute distress, appearing their stated age Eyes PERRLA, no icterus, fundoscopic exam per opthamologist Skin no lesions noted Neck supple, no adenopathy, no thyroid enlargement, no tenderness Chest clear to percussion and auscultation Heart no significant murmurs, gallops or rubs noted Abdomen no hepatosplenomegaly masses or tenderness, BS normal.  Rectal refused by patient and she began to weep when I did this was appraised Extremities no acute joint lesions, edema, phlebitis or evidence of  cellulitis. Neurologic patient oriented x 3, cranial nerves intact, no focal neurologic deficits noted. Psychological mental status normal and normal affect.  Obvious very dependent and weak personality.  Assessment and plan: Long history of functional GI complaints with IBS.  Recent leakage of ovarian cyst with overreaction in terms of pain and symptomatology to a ruptured cyst.  I have renewed her Norco and set her up to see Dr. Toney Rakes in gynecology.  I do not think this patient needs colonoscopy orfurther GI evaluation.  Suspect her surgeries in the past have been for functional problems, and there is certainly no reason to suspect underlying inflammatory bowel disease.  She may need psychological evaluation!!!!!  This patient has refused rectal exam, and perhaps the time of her gynecologic exam stool guaiac can be obtained.  It would have been impossible  today to go ahead with rectal examination.  If her stools are positive with OB/GYN exam, we will reconsider colonoscopy exam.  However, this patient does not appear ill at this time.  She had a negative endoscopy one year ago by pediatric gastroenterology, Dr. Rodman Pickle.

## 2013-07-06 NOTE — Progress Notes (Signed)
NOTNAMED CROUCHER [birth year] [id number]        17 y.o.  G0P0 new patient seen in consultation from Dr. Verl Blalock due to continuing abdominal pain. Patient has a somewhat complex history to include laparoscopic appendectomy 2012 with pathology showing acute appendicitis, laparoscopic cholecystectomy 2013 with pathology showing minimal chronic cholecystitis. Has been having bouts of diarrhea and some lower abdominal cramping since her surgery but had the relatively acute onset of lower abdominal pain 06/22/2013. Her pain worsened leading to emergency room visits x 2 where workup essentially was negative to include a negative CT scan with question of a degenerating right corpus luteum with small amount of fluid in the pelvis. White count normal at 4.0 and hemoglobin normal at 13. Negative hCG, urinalysis and comprehensive metabolic panel with the exception of a potassium of 2.8. Her pain has continued and she saw Dr. Sharlett Iles today and he did not feel that it was GI related and referred her to me for further evaluation.  She has no history of fevers chills, some nausea with occasional vomiting and diarrhea on and off. States her pain has persisted or worsened since onset and she was given a small supply of narcotics through the emergency room. She is virginal historically.  Reports regular monthly menses although they do alternate between light to heavy her periods no intermenstrual bleeding. Is not having worsening dysmenorrhea. Had been on oral contraceptives in the past for menstrual regulation and did well with these but is off of them now. Is starting college this week.  Past medical history, medications, allergies, family history and social history were all reviewed and documented in the EPIC chart.  ROS:  Performed and pertinent positives and negatives are included in the history, assessment and plan .  Exam:  Kim assistant Filed Vitals:   07/06/13 1456  BP: 122/76  Height: 5' 7"  (1.702 m)   Weight: 206 lb (93.441 kg)   General appearance  Normal  Skin grossly normal Head/Neck normal with no cervical or supraclavicular adenopathy thyroid normal Lungs  clear Cardiac RR, without RMG Abdominal  soft,  mild lower abdominal discomfort to deep palpation, without  guarding, rebound, masses, organomegaly or hernia.  Pelvic  Ext/BUS/vagina   menses type flow. Virginal status confirmed.  Cervix   not clearly visualized   Uterus  Grossly normal size, midline and mobile,  nontender by abdominal/rectal bimanual   Adnexa  Without masses or tenderness    Anus and perineum  normal   Rectovaginal  normal sphincter tone without palpated masses or tenderness.   Stool positive for blood noting she is on her menses.   Assessment/Plan:  17 y.o. G0P0 virginal female with diffuse lower abdominal pain over the past 2 weeks. Question of corpus luteal cyst on CT with benign pelvic exam. Reviewed differential with the patient and her mother and grandmother who accompany her. From a gynecologic standpoint certainly ovarian cyst can cause pain but would be very atypical to last this long. Torsion highly unlikely with her age and lack of other pathology. Infection such as PID is unlikely given virginal status and being afebrile historically with normal white count. Endometriosis possible with the acute onset and no preceding worsening dysmenorrhea unlikely. Will start with GYN ultrasound and repeat of her blood work to include CBC and comprehensive metabolic panel. I discussed with her the potassium level and emphasized the need to supplement potassium at 20 mEq daily with recheck of her potassium level recommended in several weeks. She will need  longer-term followup in reference to this and I suspect it is from a GI source.   I think given the total picture that probably GI is the source of her pain. Her stool was positive for blood but she was on her menses and is certainly to be contamination. From my  standpoint assuming the ultrasound is normal my next step would be laparoscopy which I am very reluctant to pursue at this point. If her pain would continue then I would recommend reevaluation by GI before pursuing any more invasive studies.  I did review whether we want to restart her on birth control pills as she appeared to do well from this and if indeed her pain is cyst related that this may help somewhat with suppressing ovulation and possible recurrent pain. She wants to go ahead and do this I prescribed Loestrin 120 plans to start today as she is beginning a menses.   I also prescribed Ultram 50 mg every 6 hours as needed for pain #30 no refill and K-Dur 20 milliequivalents twice for one week and then once daily.  Lastly, I provided the patient with a note for school to cover her for missed classes.    Note: This document was prepared with digital dictation and possible smart phrase technology. Any transcriptional errors that result from this process are unintentional.   Anastasio Auerbach MD, 4:40 PM 07/06/2013

## 2013-07-07 LAB — URINALYSIS W MICROSCOPIC + REFLEX CULTURE
Casts: NONE SEEN
Glucose, UA: NEGATIVE mg/dL
Leukocytes, UA: NEGATIVE
Nitrite: NEGATIVE
Protein, ur: NEGATIVE mg/dL
Squamous Epithelial / LPF: NONE SEEN
pH: 6 (ref 5.0–8.0)

## 2013-07-07 LAB — COMPREHENSIVE METABOLIC PANEL
CO2: 24 mEq/L (ref 19–32)
Calcium: 8.9 mg/dL (ref 8.4–10.5)
Chloride: 102 mEq/L (ref 96–112)
Creat: 0.68 mg/dL (ref 0.50–1.10)
Glucose, Bld: 95 mg/dL (ref 70–99)
Total Bilirubin: 0.4 mg/dL (ref 0.3–1.2)

## 2013-07-08 LAB — URINE CULTURE
Colony Count: NO GROWTH
Organism ID, Bacteria: NO GROWTH

## 2013-07-09 ENCOUNTER — Ambulatory Visit: Payer: BC Managed Care – PPO | Admitting: Gynecology

## 2013-07-09 ENCOUNTER — Encounter: Payer: Self-pay | Admitting: Gynecology

## 2013-07-09 ENCOUNTER — Other Ambulatory Visit: Payer: Self-pay | Admitting: Anesthesiology

## 2013-07-09 ENCOUNTER — Ambulatory Visit (INDEPENDENT_AMBULATORY_CARE_PROVIDER_SITE_OTHER): Payer: BC Managed Care – PPO | Admitting: Gynecology

## 2013-07-09 ENCOUNTER — Other Ambulatory Visit: Payer: BC Managed Care – PPO

## 2013-07-09 ENCOUNTER — Ambulatory Visit (INDEPENDENT_AMBULATORY_CARE_PROVIDER_SITE_OTHER): Payer: BC Managed Care – PPO

## 2013-07-09 DIAGNOSIS — Z1211 Encounter for screening for malignant neoplasm of colon: Secondary | ICD-10-CM

## 2013-07-09 DIAGNOSIS — R102 Pelvic and perineal pain: Secondary | ICD-10-CM

## 2013-07-09 DIAGNOSIS — N949 Unspecified condition associated with female genital organs and menstrual cycle: Secondary | ICD-10-CM

## 2013-07-09 DIAGNOSIS — Z803 Family history of malignant neoplasm of breast: Secondary | ICD-10-CM

## 2013-07-09 DIAGNOSIS — R1031 Right lower quadrant pain: Secondary | ICD-10-CM

## 2013-07-09 DIAGNOSIS — N946 Dysmenorrhea, unspecified: Secondary | ICD-10-CM

## 2013-07-09 DIAGNOSIS — N8300 Follicular cyst of ovary, unspecified side: Secondary | ICD-10-CM

## 2013-07-09 DIAGNOSIS — R112 Nausea with vomiting, unspecified: Secondary | ICD-10-CM

## 2013-07-09 MED ORDER — TRAMADOL HCL 50 MG PO TABS: 50.0000 mg | ORAL_TABLET | Freq: Four times a day (QID) | ORAL | Status: DC | PRN

## 2013-07-09 NOTE — Patient Instructions (Addendum)
Start on the birth control pills as we discussed. Return the kit to check for blood in the stool in a month. Call is after returning it to make sure that we received it and it was negative. Followup if any issues.

## 2013-07-09 NOTE — Progress Notes (Signed)
Patient follows up for ultrasound. Her lab work was normal noting a normal white count hemoglobin and potassium level of 3.6.  Ultrasound shows uterus normal size and echotexture. Endometrium 2.6 mm. Right and left ovaries normal with physiologic changes. No cysts or masses. Good blood flow to both ovaries.  Assessment and plan: Abdominal pain. Patient and family notices she was significantly constipated and after using magnesium citrate and Dulcolax had a significant passage of stool. Her pain seems to be getting better although still present. She never picked up the Ultram but needs a written prescription and I wrote her for Ultram 50 mg #30 no refills. She'll start on the birth control pills as we discussed and we'll see how she does. If her pain persists or recurs she'll followup with gastroenterology. Ultimately possible laparoscopy if it seems more GYN.   I did ask her to recheck her stool for blood in a month or so when she is not on her menses. She did have a positive check at my last exam but again she was also bleeding vaginally. She took him a kit to mail back to [place] and she knows to call to make sure we received it. Assuming is negative then we'll follow, if its persistently positive she'll represent to the gastroenterologist.   Lastly I've asked her to continue her potassium 20 mEq daily for one more week and then to go ahead and stop this. Remaining on a stool softener such as Metamucil daily with 20 of fluids was encouraged.

## 2013-08-01 ENCOUNTER — Ambulatory Visit: Payer: BC Managed Care – PPO | Admitting: Internal Medicine

## 2013-08-15 DIAGNOSIS — K5010 Crohn's disease of large intestine without complications: Secondary | ICD-10-CM

## 2013-08-15 HISTORY — DX: Crohn's disease of large intestine without complications: K50.10

## 2013-08-27 ENCOUNTER — Telehealth: Payer: Self-pay | Admitting: Gastroenterology

## 2013-08-27 NOTE — Telephone Encounter (Signed)
Julie Ward seen om 07/06/13 by Julie Julie Ward for abdominal pain. History of Present Illness: This is a 18 year old black female with a history of chronic IBS who has had cholecystectomy for suspected gallbladder dysfunction, appendectomy, and he recently had a ruptured ovarian cyst dissected in the emergency room. While she is referred to gastroenterology is unclear. She's been free of pain for the last year after her surgery but had sudden onset of pain several days ago, and apparently had some bloody mucus in her stool at that time. Since that time she's been completely constipated. She denies chronic diarrhea or chronic rectal bleeding.. She has chronic mild constipation but denies rectal bleeding, or any upper GI or hepatobiliary complaints. Exam she is with her mother and is weeping throughout the procedure. View of her labs and x-ray showed no abnormalities. There is some pelvic fluid from what appears to be a ovarian cyst. Patient denies systemic complaints, use of alcohol cigarettes or NSAIDs. Is been no anorexia or weight loss.  She saw Julie Ward who has done an U/S that was normal. She did have a heavy stool burden but used mag citrate and dulcolax. Julie Ward states he would like for Julie Ward to see GI again. Her grandmother called today for an appt for Julie Ward tomorrow when she has no classes. When I asked if the pain was above or below the waist, she did not know. Julie Ward will see Julie Bogus, PA tomorrow.

## 2013-08-28 ENCOUNTER — Encounter: Payer: Self-pay | Admitting: Gastroenterology

## 2013-08-28 ENCOUNTER — Ambulatory Visit (INDEPENDENT_AMBULATORY_CARE_PROVIDER_SITE_OTHER): Payer: BC Managed Care – PPO | Admitting: Gastroenterology

## 2013-08-28 VITALS — BP 100/68 | HR 88 | Ht 67.0 in | Wt 211.0 lb

## 2013-08-28 DIAGNOSIS — R194 Change in bowel habit: Secondary | ICD-10-CM | POA: Insufficient documentation

## 2013-08-28 DIAGNOSIS — R198 Other specified symptoms and signs involving the digestive system and abdomen: Secondary | ICD-10-CM

## 2013-08-28 DIAGNOSIS — R109 Unspecified abdominal pain: Secondary | ICD-10-CM | POA: Insufficient documentation

## 2013-08-28 DIAGNOSIS — R195 Other fecal abnormalities: Secondary | ICD-10-CM

## 2013-08-28 MED ORDER — DICYCLOMINE HCL 10 MG PO CAPS: 10.0000 mg | ORAL_CAPSULE | Freq: Three times a day (TID) | ORAL | Status: DC

## 2013-08-28 MED ORDER — MOVIPREP 100 G PO SOLR: 1.0000 | Freq: Once | ORAL | Status: DC

## 2013-08-28 NOTE — Patient Instructions (Addendum)
You have been scheduled for a colonoscopy with propofol. Please follow written instructions given to you at your visit today.  Please pick up your prep kit at the pharmacy within the next 1-3 days. [facility] [address] Also the Bentyl ( Dicyclomine )  If you use inhalers (even only as needed), please bring them with you on the day of your procedure. Your physician has requested that you go to www.startemmi.com and enter the access code given to you at your visit today. This web site gives a general overview about your procedure. However, you should still follow specific instructions given to you by our office regarding your preparation for the procedure.

## 2013-08-28 NOTE — Progress Notes (Signed)
08/28/2013 Julie Ward [id number] [birth year]   History of Present Illness:  This is a 17 year old female who was seen by Dr. Sharlett Iles for the first time in August of this year. She was seen for abdominal pain and constipation at that time. She was referred to GYN for evaluation of her abdominal pain. Colonoscopy was not deemed warranted at that time, however, if she tested hemccult positive then should be considered. She had deferred a rectal exam during her visit with Dr. Sharlett Iles, however. She does have a history of IBS.  She returns to our office today with continued complaints of abdominal pain. She states that the pain that she is having currently began this past Saturday and has been constant since that time. The pain is located diffusely over her abdomen, and she reports that they do keep her from sleep at night. She describes the pain as stabbing at times and says that she cannot walk because the pain was so severe when it began this past weekend.  She missed school yesterday because of the pain.  She reports a change in her bowels with loose stools/diarrhea most times when she moves her bowels; she does not have a bowel movement every day but when she does move her bowels it is always loose. She saw her GYN and they said that she did not have any gynecologic cause for her abdominal pain and that she needed to be seen back here in our office. She saw her PCP who performed a rectal exam, which was Hemoccult-positive. She denies any nausea, vomiting, or weight loss.  She does not appear ill or in any distress during her visit today.  Her mother accompanied her during her visit today.   Current Medications, Allergies, Past Medical History, Past Surgical History, Family History and Social History were reviewed in [employer] record.   Physical Exam: BP 100/68  Pulse 88  Ht 5' 7"  (1.702 m)  Wt 95.709 kg (211 lb)  BMI 33.04 kg/m2  LMP 08/28/2013 General: Well  developed, black female in no acute distress Head: Normocephalic and atraumatic Eyes:  Sclerae anicteric, conjunctiva pink  Ears: Normal auditory acuity Lungs: Clear throughout to auscultation Heart: Regular rate and rhythm Abdomen: Soft, non-distended.  BS present.  Mild diffuse TTP without R/R/G. Rectal: Deferred.  Will be done at the time of colonoscopy. Musculoskeletal: Symmetrical with no gross deformities  Extremities: No edema  Neurological: Alert oriented x 4, grossly nonfocal Psychological:  Alert and cooperative. Normal mood and affect  Assessment and Recommendations: -17 year old female with complaints of abdominal pain and now a change in bowel habits with loose stools. She was also tested heme positive on digital rectal exam by her PCP. We will schedule for colonoscopy with Dr. Sharlett Iles to rule out inflammatory bowel disease. Her symptoms may very well be secondary to IBS with heme positive stools due to trauma by digital rectal exam. Will give her Bentyl 10 mg to be taken 3 times daily in the interim. We will give her notes for school for missing her classes yesterday and today.

## 2013-08-29 ENCOUNTER — Emergency Department (HOSPITAL_COMMUNITY): Payer: BC Managed Care – PPO

## 2013-08-29 ENCOUNTER — Emergency Department (HOSPITAL_COMMUNITY)
Admission: EM | Admit: 2013-08-29 | Discharge: 2013-08-30 | Disposition: A | Discharge status: Home / Self Care | Payer: BC Managed Care – PPO | Attending: Emergency Medicine | Admitting: Emergency Medicine

## 2013-08-29 ENCOUNTER — Encounter (HOSPITAL_COMMUNITY): Payer: Self-pay | Admitting: Emergency Medicine

## 2013-08-29 DIAGNOSIS — R110 Nausea: Secondary | ICD-10-CM | POA: Insufficient documentation

## 2013-08-29 DIAGNOSIS — Z8742 Personal history of other diseases of the female genital tract: Secondary | ICD-10-CM | POA: Insufficient documentation

## 2013-08-29 DIAGNOSIS — Z3202 Encounter for pregnancy test, result negative: Secondary | ICD-10-CM | POA: Insufficient documentation

## 2013-08-29 DIAGNOSIS — Z9889 Other specified postprocedural states: Secondary | ICD-10-CM | POA: Insufficient documentation

## 2013-08-29 DIAGNOSIS — Z880 Allergy status to penicillin: Secondary | ICD-10-CM | POA: Insufficient documentation

## 2013-08-29 DIAGNOSIS — R109 Unspecified abdominal pain: Secondary | ICD-10-CM

## 2013-08-29 DIAGNOSIS — Z9089 Acquired absence of other organs: Secondary | ICD-10-CM | POA: Insufficient documentation

## 2013-08-29 DIAGNOSIS — R1084 Generalized abdominal pain: Secondary | ICD-10-CM | POA: Insufficient documentation

## 2013-08-29 DIAGNOSIS — Z79899 Other long term (current) drug therapy: Secondary | ICD-10-CM | POA: Insufficient documentation

## 2013-08-29 DIAGNOSIS — E876 Hypokalemia: Secondary | ICD-10-CM | POA: Insufficient documentation

## 2013-08-29 DIAGNOSIS — K219 Gastro-esophageal reflux disease without esophagitis: Secondary | ICD-10-CM | POA: Insufficient documentation

## 2013-08-29 LAB — CBC WITH DIFFERENTIAL/PLATELET
Basophils Absolute: 0 10*3/uL (ref 0.0–0.1)
Eosinophils Absolute: 0 10*3/uL (ref 0.0–0.7)
Eosinophils Relative: 0 % (ref 0–5)
Lymphocytes Relative: 26 % (ref 12–46)
MCH: 30.5 pg (ref 26.0–34.0)
MCHC: 36.1 g/dL — ABNORMAL HIGH (ref 30.0–36.0)
MCV: 84.5 fL (ref 78.0–100.0)
Monocytes Absolute: 1.1 10*3/uL — ABNORMAL HIGH (ref 0.1–1.0)
Monocytes Relative: 19 % — ABNORMAL HIGH (ref 3–12)
Platelets: 194 10*3/uL (ref 150–400)
RDW: 11.6 % (ref 11.5–15.5)
WBC: 5.6 10*3/uL (ref 4.0–10.5)

## 2013-08-29 LAB — COMPREHENSIVE METABOLIC PANEL
ALT: 9 U/L (ref 0–35)
AST: 15 U/L (ref 0–37)
Albumin: 3.6 g/dL (ref 3.5–5.2)
BUN: 10 mg/dL (ref 6–23)
Calcium: 9.1 mg/dL (ref 8.4–10.5)
Sodium: 133 mEq/L — ABNORMAL LOW (ref 135–145)
Total Bilirubin: 0.7 mg/dL (ref 0.3–1.2)
Total Protein: 8.4 g/dL — ABNORMAL HIGH (ref 6.0–8.3)

## 2013-08-29 LAB — URINALYSIS, ROUTINE W REFLEX MICROSCOPIC
Hgb urine dipstick: NEGATIVE
Nitrite: NEGATIVE
Specific Gravity, Urine: 1.034 — ABNORMAL HIGH (ref 1.005–1.030)
pH: 6 (ref 5.0–8.0)

## 2013-08-29 MED ORDER — MORPHINE SULFATE 4 MG/ML IJ SOLN
4.0000 mg | Freq: Once | INTRAMUSCULAR | Status: AC
  Administered 2013-08-29: 4 mg via INTRAVENOUS
  Filled 2013-08-29: qty 1

## 2013-08-29 MED ORDER — IOHEXOL 300 MG/ML  SOLN
50.0000 mL | Freq: Once | INTRAMUSCULAR | Status: AC | PRN
  Administered 2013-08-29: 50 mL via ORAL

## 2013-08-29 MED ORDER — POTASSIUM CHLORIDE 20 MEQ/15ML (10%) PO LIQD
40.0000 meq | Freq: Once | ORAL | Status: AC
  Administered 2013-08-29: 40 meq via ORAL
  Filled 2013-08-29: qty 30

## 2013-08-29 MED ORDER — ONDANSETRON HCL 4 MG/2ML IJ SOLN
4.0000 mg | Freq: Once | INTRAMUSCULAR | Status: AC
  Administered 2013-08-29: 4 mg via INTRAVENOUS
  Filled 2013-08-29: qty 2

## 2013-08-29 MED ORDER — POTASSIUM CHLORIDE 10 MEQ/100ML IV SOLN
10.0000 meq | Freq: Once | INTRAVENOUS | Status: AC
  Administered 2013-08-29: 10 meq via INTRAVENOUS
  Filled 2013-08-29: qty 100

## 2013-08-29 NOTE — ED Notes (Signed)
Patient's mother also states "The stomach doctor wanted me to let you all know that she can only be stuck once and that's it. Anymore than that and her vein blows. He says he needs that arm to be saved for tomorrow, so if you give her anything it needs to be a shot."

## 2013-08-29 NOTE — ED Notes (Signed)
Patient with c/o abdominal pain, N/V/D since Saturday Patient has taken Ultram and Bentyl (last dose at 5 pm) without relief Patient was at GI specialist yesterday and is scheduled for outpatient colonoscopy tomorrow morning to rule out IBS, Crohn's No active vomiting, dry heaving during assessment Patient in NAD

## 2013-08-29 NOTE — ED Provider Notes (Signed)
CSN: [id number]     Arrival date & time 08/29/13  2007 History   First MD Initiated Contact with Patient 08/29/13 2043     Chief Complaint  Patient presents with  . Bloated  . Abdominal Pain  . Nausea  . Emesis   (Consider location/radiation/quality/duration/timing/severity/associated sxs/prior Treatment) HPI  This is a 17 year old female who presents with abdominal pain. She has a history recurrent abdominal pain and has had a cholecystectomy and appendectomy. Patient reports onset of worsening abdominal pain on Saturday. She reports that it is diffuse and constant. Sometimes it does get worse but she cannot identify any exacerbating or alleviating factors. She denies any vomiting but does endorse nausea and dry heaving. Patient was seen by GI yesterday and was set up for colonoscopy to be done on Friday. Patient denies any fevers. She's currently taking Zofran and Bentyl at home. Patient has taken hydrocodone without any relief of her pain.  LMP three weeks ago.  Denies pelvic pain or vaginal discharge.  Review of patient's chart reveals multiple prior episodes and visits for abdominal pain. Last CT scan was done in August.  Past Medical History  Diagnosis Date  . GERD (gastroesophageal reflux disease)   . Allergy   . Dysmenorrhea   . Abdominal pain, recurrent   . Appendicitis    Past Surgical History  Procedure Laterality Date  . Appendectomy  07-14-11    laparoscopic   . Esophagogastroduodenoscopy  01/28/2012    Procedure: ESOPHAGOGASTRODUODENOSCOPY (EGD);  Surgeon: Oletha Blend, MD;  Location: [place];  Service: Gastroenterology;  Laterality: N/A;  . Wisdom tooth extraction    . Cholecystectomy  02/11/2012    Procedure: LAPAROSCOPIC CHOLECYSTECTOMY WITH INTRAOPERATIVE CHOLANGIOGRAM;  Surgeon: Gwenyth Ober, MD;  Location: [facility];  Service: General;  Laterality: N/A;   Family History  Problem Relation Age of Onset  . Cancer Paternal Aunt     ovarian, kidney  . Cancer Paternal  Uncle     colon  . Cancer Maternal Grandmother     breast   . Hypertension Maternal Grandmother   . Hypertension Paternal Grandmother   . Hyperlipidemia Paternal Grandmother   . Irritable bowel syndrome Maternal Grandmother   . Heart disease Maternal Grandmother    History  Substance Use Topics  . Smoking status: Never Smoker   . Smokeless tobacco: Never Used  . Alcohol Use: No     Comment: none   OB History   Grav Para Term Preterm Abortions TAB SAB Ect Mult Living   0              Review of Systems  Constitutional: Negative for fever.  Respiratory: Negative for cough, chest tightness and shortness of breath.   Cardiovascular: Negative for chest pain.  Gastrointestinal: Positive for nausea and abdominal pain. Negative for vomiting, diarrhea, constipation and blood in stool.  Genitourinary: Negative for dysuria, vaginal bleeding and vaginal discharge.  Neurological: Negative for headaches.  Psychiatric/Behavioral: Negative for confusion.  All other systems reviewed and are negative.    Allergies  Oxycontin and Penicillins  Home Medications   Current Outpatient Rx  Name  Route  Sig  Dispense  Refill  . dicyclomine (BENTYL) 10 MG capsule   Oral   Take 1 capsule (10 mg total) by mouth 4 (four) times daily -  before meals and at bedtime.   90 capsule   1   . esomeprazole (NEXIUM) 40 MG capsule   Oral   Take 40 mg by mouth  daily before breakfast.         . traMADol (ULTRAM) 50 MG tablet   Oral   Take 1 tablet (50 mg total) by mouth every 6 (six) hours as needed for pain.   30 tablet   0   . MOVIPREP 100 G SOLR   Oral   Take 1 kit (200 g total) by mouth once. "Pharmacist please use BIN: [id number] GROUP: [id number] ID: [id number] Call [phone] for pharmacy questions "Pt will save $10"   1 kit   0     Dispense as written.   Marland Kitchen oxyCODONE-acetaminophen (PERCOCET/ROXICET) 5-325 MG per tablet   Oral   Take 1 tablet by mouth every 6 (six) hours as needed  for pain.   8 tablet   0    BP 122/57  Pulse 87  Temp(Src) 98.9 F (37.2 C) (Oral)  Resp 18  Ht 5' 7"  (1.702 m)  Wt 205 lb (92.987 kg)  BMI 32.1 kg/m2  SpO2 99%  LMP 08/28/2013 Physical Exam  Nursing note and vitals reviewed. Constitutional: She is oriented to person, place, and time. She appears well-developed and well-nourished. No distress.  HENT:  Head: Normocephalic and atraumatic.  Mouth/Throat: Oropharynx is clear and moist.  Cardiovascular: Normal rate, regular rhythm and normal heart sounds.   No murmur heard. Pulmonary/Chest: Effort normal and breath sounds normal. No respiratory distress. She has no wheezes.  Abdominal: Soft. Bowel sounds are normal. There is tenderness. There is no rebound and no guarding.  Mild diffuse tenderness to palpation without rebound or guarding in the upper quadrants.  Neurological: She is alert and oriented to person, place, and time.  Skin: Skin is warm and dry.  Psychiatric: She has a normal mood and affect.    ED Course  Procedures (including critical care time) Labs Review Labs Reviewed  CBC WITH DIFFERENTIAL - Abnormal; Notable for the following:    MCHC 36.1 (*)    Monocytes Relative 19 (*)    Monocytes Absolute 1.1 (*)    All other components within normal limits  COMPREHENSIVE METABOLIC PANEL - Abnormal; Notable for the following:    Sodium 133 (*)    Potassium 2.8 (*)    Total Protein 8.4 (*)    All other components within normal limits  URINALYSIS, ROUTINE W REFLEX MICROSCOPIC - Abnormal; Notable for the following:    Color, Urine AMBER (*)    Specific Gravity, Urine 1.034 (*)    Bilirubin Urine SMALL (*)    Urobilinogen, UA 2.0 (*)    All other components within normal limits  CG4 I-STAT (LACTIC ACID)   Imaging Review Ct Abdomen Pelvis W Contrast  08/30/2013   CLINICAL DATA:  Abdominal pain with nausea and vomiting. Left lower quadrant pain. Normal white count.  EXAM: CT ABDOMEN AND PELVIS WITH CONTRAST   TECHNIQUE: Multidetector CT imaging of the abdomen and pelvis was performed using the standard protocol following bolus administration of intravenous contrast.  CONTRAST:  168m OMNIPAQUE IOHEXOL 300 MG/ML  SOLN  COMPARISON:  06/28/2013.  FINDINGS: BODY WALL: Unremarkable.  LOWER CHEST:  Mediastinum: Unremarkable.  Lungs/pleura: No consolidation.  ABDOMEN/PELVIS:  Liver: No focal abnormality.  Biliary: No evidence of biliary obstruction or stone. Post cholecystectomy.  Pancreas: Unremarkable.  Spleen: Unremarkable.  Adrenals: Unremarkable.  Kidneys and ureters: No hydronephrosis or stone.  Bladder: Unremarkable.  Bowel: No obstruction. Status post appendectomy.  Retroperitoneum: No mass or adenopathy.  Peritoneum: No free fluid or gas.  Reproductive: Right corpus luteum cyst  re-demonstrated. Pelvic free fluid in the cul-de-sac slightly more than was seen in August, still likely physiologic. Possible recent cyst rupture. .  Vascular: No acute abnormality.  OSSEOUS: No acute abnormalities. No suspicious lytic or blastic lesions.  IMPRESSION: Slightly more pelvic free fluid in the cul-de-sac compared with August. Right corpus luteum cyst. No left adnexal lesion. Post cholecystectomy and post appendectomy. No acute bowel process is observed.   Electronically Signed   By: Rolla Flatten M.D.   On: 08/30/2013 01:14    EKG Interpretation   None       MDM   1. Abdominal pain   2. Hypokalemia    Patient presents with worsening abdominal pain.  NOntoxic on exam and VSS.  Patient abdominal exam is benign.  Patient given pain and antiemetic.  Labwork reassuring.  Patient continues to complain of refractory pain.  I discussed with the patient that I thought a CT scan would be low yield but patient states frustration and wishes to proceed. K noted to be low.  Given K run and oral potassium.  Patient has outpatient K Rx.  Encouraged to continue as outpatient with PCP follow-up. CT pending at time of  signout.    Merryl Hacker, MD 08/30/13 [phone]

## 2013-08-30 ENCOUNTER — Encounter (HOSPITAL_COMMUNITY): Payer: Self-pay

## 2013-08-30 ENCOUNTER — Telehealth: Payer: Self-pay | Admitting: Gastroenterology

## 2013-08-30 ENCOUNTER — Telehealth: Payer: Self-pay | Admitting: *Deleted

## 2013-08-30 LAB — POCT PREGNANCY, URINE: Preg Test, Ur: NEGATIVE

## 2013-08-30 MED ORDER — POTASSIUM CHLORIDE CRYS ER 20 MEQ PO TBCR: 40.0000 meq | EXTENDED_RELEASE_TABLET | Freq: Once | ORAL | Status: DC

## 2013-08-30 MED ORDER — IOHEXOL 300 MG/ML  SOLN
100.0000 mL | Freq: Once | INTRAMUSCULAR | Status: AC | PRN
  Administered 2013-08-30: 100 mL via INTRAVENOUS

## 2013-08-30 MED ORDER — OXYCODONE-ACETAMINOPHEN 5-325 MG PO TABS
1.0000 | ORAL_TABLET | Freq: Once | ORAL | Status: AC
  Administered 2013-08-30: 1 via ORAL
  Filled 2013-08-30: qty 1

## 2013-08-30 MED ORDER — OXYCODONE-ACETAMINOPHEN 5-325 MG PO TABS: 1.0000 | ORAL_TABLET | Freq: Four times a day (QID) | ORAL | Status: DC | PRN

## 2013-08-30 NOTE — ED Notes (Signed)
Md made aware pt questioning discharge home.

## 2013-08-30 NOTE — ED Provider Notes (Signed)
Patient signed out to me by Dr. Dina Rich and abdominal CT without acute process. Patient will be discharged. Patient's hypokalemia treated with oral potassium  Julie Jacobsen, MD 08/30/13 [phone]

## 2013-08-30 NOTE — Telephone Encounter (Signed)
Last night patient went to ED.  Did CT to find out what is causing abd pain.   Mother spoke to me, and they don't know the results of the CT.  Patient is on Oxycodone.  I told patient's mom to be sure that she needs to come tomorrow so we can find out what's wrong.  I told her to take her medication.  ED doc told her to call us.  She will be here tomorrow.

## 2013-08-31 ENCOUNTER — Ambulatory Visit (AMBULATORY_SURGERY_CENTER): Payer: BC Managed Care – PPO | Admitting: Gastroenterology

## 2013-08-31 ENCOUNTER — Encounter: Payer: Self-pay | Admitting: Gastroenterology

## 2013-08-31 ENCOUNTER — Telehealth: Payer: Self-pay | Admitting: *Deleted

## 2013-08-31 VITALS — BP 126/71 | HR 65 | Temp 98.4°F | Resp 16 | Ht 67.0 in | Wt 211.0 lb

## 2013-08-31 DIAGNOSIS — K5289 Other specified noninfective gastroenteritis and colitis: Secondary | ICD-10-CM

## 2013-08-31 DIAGNOSIS — K5090 Crohn's disease, unspecified, without complications: Secondary | ICD-10-CM

## 2013-08-31 DIAGNOSIS — R195 Other fecal abnormalities: Secondary | ICD-10-CM

## 2013-08-31 MED ORDER — BUDESONIDE 3 MG PO CP24: 9.0000 mg | ORAL_CAPSULE | ORAL | Status: DC

## 2013-08-31 MED ORDER — SODIUM CHLORIDE 0.9 % IV SOLN: 500.0000 mL | INTRAVENOUS | Status: DC

## 2013-08-31 NOTE — Progress Notes (Signed)
Called to room to assist during endoscopic procedure.  Patient ID and intended procedure confirmed with present staff. Received instructions for my participation in the procedure from the performing physician.  

## 2013-08-31 NOTE — Op Note (Signed)
[place]  [address]. [place], [address]   COLONOSCOPY PROCEDURE REPORT  PATIENT: Julie Ward, Julie Ward  MR#: [id number] BIRTHDATE: [birth year] , 17  yrs. old GENDER: Female ENDOSCOPIST: Sable Feil, MD, [facility] REFERRED OV:FIEP Toney Rakes, M.D. PROCEDURE DATE:  08/31/2013 PROCEDURE:   Colonoscopy with biopsy First Screening Colonoscopy - Avg.  risk and is 50 yrs.  old or older Yes.  Prior Negative Screening - Now for repeat screening. N/A  History of Adenoma - Now for follow-up colonoscopy & has been > or = to 3 yrs.  N/A ASA CLASS:   Class II INDICATIONS:Constipation. MEDICATIONS: Propofol (Diprivan) 400 mg IV  DESCRIPTION OF PROCEDURE:   After the risks benefits and alternatives of the procedure were thoroughly explained, informed consent was obtained.  A digital rectal exam revealed no abnormalities of the rectum.   The LB [id number]  endoscope was introduced through the anus and advanced to the terminal ileum which was intubated for a short distance. No adverse events experienced.   The quality of the prep was excellent, using MoviPrep  The instrument was then slowly withdrawn as the colon was fully examined.    the colonoscope easily passed into the cecal area.  It is inflamed cecum with pigmentation causing edema but there were no ulcerations or bleeding.  Pictures were obtained for documentation of her multiple biopsies.  Endoscopy as it passed into the terminal ileum which appeared nodular with a few superficial erosions. Again pictures were obtained for documentation as were biopsies. There was no significant bleeding or stenosis. arthroscopy left colon showed no other mucosal polypoid lesions.  Biopsies were obtained #1 of the ileum and #2 of the ileocecal valve[Retroflexion] The time to cecum=2 minutes 43 seconds. Withdrawal time=11 minutes 02 seconds.  The scope was withdrawn and the procedure completed. COMPLICATIONS: There were no  complications.  ENDOSCOPIC IMPRESSION:probable localized Crohn's disease of the distal terminal ileum and ileocecal valve, rule out infiltrative process.  Other considerations would be unusual inflammation with some atypical infiltrative process.  RECOMMENDATIONS: Await biopsy results..start Entocort 9 MG/day.Will refer to Dr.Richard [facility] at [place] [facility].   eSigned:  Sable Feil, MD, [facility] 08/31/2013 2:57 PM   cc:   PATIENT NAME:  Taelar, [place] MR#: [id number]

## 2013-08-31 NOTE — Progress Notes (Signed)
Patient did not experience any of the following events: a burn prior to discharge; a fall within the facility; wrong site/side/patient/procedure/implant event; or a hospital transfer or hospital admission upon discharge from the facility. (G8907) Patient did not have preoperative order for IV antibiotic SSI prophylaxis. (G8918)  

## 2013-08-31 NOTE — Patient Instructions (Signed)
Impressions/recommendations:  Probable localized Crohns disease.  Start Entocort daily.  Referral to Dr. Rickard Rhymes at [place]: Refer to the procedure report that was given to you for any specific questions about what was found during the examination.  If the procedure report does not answer your questions, please call your gastroenterologist to clarify.  If you requested that your care partner not be given the details of your procedure findings, then the procedure report has been included in a sealed envelope for you to review at your convenience later.  YOU SHOULD EXPECT: Some feelings of bloating in the abdomen. Passage of more gas than usual.  Walking can help get rid of the air that was put into your GI tract during the procedure and reduce the bloating. If you had a lower endoscopy (such as a colonoscopy or flexible sigmoidoscopy) you may notice spotting of blood in your stool or on the toilet paper. If you underwent a bowel prep for your procedure, then you may not have a normal bowel movement for a few days.  DIET: Your first meal following the procedure should be a light meal and then it is ok to progress to your normal diet.  A half-sandwich or bowl of soup is an example of a good first meal.  Heavy or fried foods are harder to digest and may make you feel nauseous or bloated.  Likewise meals heavy in dairy and vegetables can cause extra gas to form and this can also increase the bloating.  Drink plenty of fluids but you should avoid alcoholic beverages for 24 hours.  ACTIVITY: Your care partner should take you home directly after the procedure.  You should plan to take it easy, moving slowly for the rest of the day.  You can resume normal activity the day after the procedure however you should NOT DRIVE or use heavy machinery for 24 hours (because of the sedation medicines used during the test).     SYMPTOMS TO REPORT IMMEDIATELY: A gastroenterologist can be reached at any hour.  During normal business hours, 8:30 AM to 5:00 PM Monday through Friday, call [phone].  After hours and on weekends, please call the GI answering service at [phone] who will take a message and have the physician on call contact you.   Following lower endoscopy (colonoscopy or flexible sigmoidoscopy):  Excessive amounts of blood in the stool  Significant tenderness or worsening of abdominal pains  Swelling of the abdomen that is new, acute  Fever of 100F or higher  FOLLOW UP: If any biopsies were taken you will be contacted by phone or by letter within the next 1-3 weeks.  Call your gastroenterologist if you have not heard about the biopsies in 3 weeks.  Our staff will call the home number listed on your records the next business day following your procedure to check on you and address any questions or concerns that you may have at that time regarding the information given to you following your procedure. This is a courtesy call and so if there is no answer at the home number and we have not heard from you through the emergency physician on call, we will assume that you have returned to your regular daily activities without incident.  SIGNATURES/CONFIDENTIALITY: You and/or your care partner have signed paperwork which will be entered into your electronic medical record.  These signatures attest to the fact that that the information  above on your After Visit Summary has been reviewed and is understood.  Full responsibility of the confidentiality of this discharge information lies with you and/or your care-partner.

## 2013-09-03 ENCOUNTER — Telehealth: Payer: Self-pay | Admitting: *Deleted

## 2013-09-03 ENCOUNTER — Telehealth: Payer: Self-pay | Admitting: Gastroenterology

## 2013-09-03 NOTE — Telephone Encounter (Signed)
Unable to leave message voicemail not set up.

## 2013-09-03 NOTE — Telephone Encounter (Signed)
I have not addressed the phone question at all; about meds. I have received 2 calls from the mother or grandmother about the appt. I do not know who scheduled the appt with Dr Rickard Rhymes, I was going to do it this am. The person needs the correct number for the doctor. The 2nd call was from mother/grandmother again. Pt could be seen today at 1pm, but were told it would take 7-10 days to get records. Explained I will copy the record for them

## 2013-09-05 NOTE — Telephone Encounter (Signed)
Called to see if pt got seen at [facility] and she stated yes. Informed her we have an appt scheduled for her on 09/11/13, but she probably doesn't need it; she agreed so it was cancelled. The doc at [facility] would like for [place] to send the path results to her. Faxed to [employer]. Tressie Ellis, [place] at [phone]

## 2013-09-06 ENCOUNTER — Encounter: Payer: Self-pay | Admitting: Gastroenterology

## 2013-09-08 ENCOUNTER — Other Ambulatory Visit: Payer: Self-pay | Admitting: Gastroenterology

## 2013-09-08 DIAGNOSIS — R195 Other fecal abnormalities: Secondary | ICD-10-CM

## 2013-09-08 DIAGNOSIS — K5090 Crohn's disease, unspecified, without complications: Secondary | ICD-10-CM

## 2013-09-10 MED ORDER — BUDESONIDE 3 MG PO CP24: 9.0000 mg | ORAL_CAPSULE | ORAL | Status: DC

## 2013-09-11 ENCOUNTER — Ambulatory Visit: Payer: BC Managed Care – PPO | Admitting: Gastroenterology

## 2013-09-20 ENCOUNTER — Other Ambulatory Visit: Payer: Self-pay

## 2013-09-20 NOTE — Telephone Encounter (Signed)
See note from 08-30-13

## 2013-10-08 ENCOUNTER — Other Ambulatory Visit: Payer: Self-pay | Admitting: Family Medicine

## 2013-10-28 ENCOUNTER — Emergency Department (HOSPITAL_COMMUNITY)
Admission: EM | Admit: 2013-10-28 | Discharge: 2013-10-28 | Disposition: A | Discharge status: Home / Self Care | Payer: BC Managed Care – PPO | Attending: Emergency Medicine | Admitting: Emergency Medicine

## 2013-10-28 ENCOUNTER — Emergency Department (HOSPITAL_COMMUNITY): Payer: BC Managed Care – PPO

## 2013-10-28 ENCOUNTER — Encounter (HOSPITAL_COMMUNITY): Payer: Self-pay | Admitting: Emergency Medicine

## 2013-10-28 DIAGNOSIS — R197 Diarrhea, unspecified: Secondary | ICD-10-CM | POA: Insufficient documentation

## 2013-10-28 DIAGNOSIS — K219 Gastro-esophageal reflux disease without esophagitis: Secondary | ICD-10-CM | POA: Insufficient documentation

## 2013-10-28 DIAGNOSIS — Z3202 Encounter for pregnancy test, result negative: Secondary | ICD-10-CM | POA: Insufficient documentation

## 2013-10-28 DIAGNOSIS — Z880 Allergy status to penicillin: Secondary | ICD-10-CM | POA: Insufficient documentation

## 2013-10-28 DIAGNOSIS — Z79899 Other long term (current) drug therapy: Secondary | ICD-10-CM | POA: Insufficient documentation

## 2013-10-28 DIAGNOSIS — Z8742 Personal history of other diseases of the female genital tract: Secondary | ICD-10-CM | POA: Insufficient documentation

## 2013-10-28 DIAGNOSIS — R109 Unspecified abdominal pain: Secondary | ICD-10-CM | POA: Insufficient documentation

## 2013-10-28 DIAGNOSIS — R112 Nausea with vomiting, unspecified: Secondary | ICD-10-CM | POA: Insufficient documentation

## 2013-10-28 HISTORY — DX: Crohn's disease of large intestine without complications: K50.10

## 2013-10-28 LAB — COMPREHENSIVE METABOLIC PANEL
ALT: 8 U/L (ref 0–35)
AST: 12 U/L (ref 0–37)
Albumin: 3.6 g/dL (ref 3.5–5.2)
Alkaline Phosphatase: 68 U/L (ref 39–117)
BUN: 8 mg/dL (ref 6–23)
CO2: 21 mEq/L (ref 19–32)
Calcium: 8.6 mg/dL (ref 8.4–10.5)
Chloride: 102 mEq/L (ref 96–112)
Creatinine, Ser: 0.57 mg/dL (ref 0.50–1.10)
GFR calc non Af Amer: >90 mL/min (ref >90)
Potassium: 2.9 mEq/L — ABNORMAL LOW (ref 3.5–5.1)
Total Bilirubin: 0.7 mg/dL (ref 0.3–1.2)

## 2013-10-28 LAB — CBC WITH DIFFERENTIAL/PLATELET
Basophils Absolute: 0 10*3/uL (ref 0.0–0.1)
Eosinophils Relative: 1 % (ref 0–5)
HCT: 37.7 % (ref 36.0–46.0)
Hemoglobin: 13.2 g/dL (ref 12.0–15.0)
Lymphocytes Relative: 14 % (ref 12–46)
Lymphs Abs: 0.9 10*3/uL (ref 0.7–4.0)
Monocytes Absolute: 0.9 10*3/uL (ref 0.1–1.0)
Neutro Abs: 4.7 10*3/uL (ref 1.7–7.7)
Neutrophils Relative %: 72 % (ref 43–77)
RBC: 4.27 MIL/uL (ref 3.87–5.11)
RDW: 12.4 % (ref 11.5–15.5)
WBC: 6.5 10*3/uL (ref 4.0–10.5)

## 2013-10-28 LAB — URINALYSIS W MICROSCOPIC + REFLEX CULTURE
Bilirubin Urine: NEGATIVE
Hgb urine dipstick: NEGATIVE
Nitrite: NEGATIVE
Protein, ur: NEGATIVE mg/dL
Specific Gravity, Urine: 1.028 (ref 1.005–1.030)
Urobilinogen, UA: 0.2 mg/dL (ref 0.0–1.0)

## 2013-10-28 LAB — POCT PREGNANCY, URINE: Preg Test, Ur: NEGATIVE

## 2013-10-28 MED ORDER — ONDANSETRON HCL 4 MG/2ML IJ SOLN
4.0000 mg | Freq: Once | INTRAMUSCULAR | Status: AC
  Administered 2013-10-28: 4 mg via INTRAVENOUS
  Filled 2013-10-28: qty 2

## 2013-10-28 MED ORDER — IOHEXOL 300 MG/ML  SOLN
100.0000 mL | Freq: Once | INTRAMUSCULAR | Status: AC | PRN
  Administered 2013-10-28: 100 mL via INTRAVENOUS

## 2013-10-28 MED ORDER — POTASSIUM CHLORIDE 10 MEQ/100ML IV SOLN
10.0000 meq | Freq: Once | INTRAVENOUS | Status: AC
  Administered 2013-10-28: 10 meq via INTRAVENOUS
  Filled 2013-10-28: qty 100

## 2013-10-28 MED ORDER — IOHEXOL 300 MG/ML  SOLN
50.0000 mL | Freq: Once | INTRAMUSCULAR | Status: AC | PRN
  Administered 2013-10-28: 50 mL via ORAL

## 2013-10-28 MED ORDER — HYDROMORPHONE HCL PF 1 MG/ML IJ SOLN
0.5000 mg | Freq: Once | INTRAMUSCULAR | Status: AC
  Administered 2013-10-28: 0.5 mg via INTRAVENOUS
  Filled 2013-10-28: qty 1

## 2013-10-28 MED ORDER — HYDROMORPHONE HCL PF 1 MG/ML IJ SOLN
1.0000 mg | Freq: Once | INTRAMUSCULAR | Status: AC
  Administered 2013-10-28: 1 mg via INTRAVENOUS
  Filled 2013-10-28: qty 1

## 2013-10-28 MED ORDER — DIPHENHYDRAMINE HCL 50 MG/ML IJ SOLN
12.5000 mg | Freq: Once | INTRAMUSCULAR | Status: AC
  Administered 2013-10-28: 12.5 mg via INTRAVENOUS
  Filled 2013-10-28: qty 1

## 2013-10-28 MED ORDER — SODIUM CHLORIDE 0.9 % IV BOLUS (SEPSIS)
1000.0000 mL | Freq: Once | INTRAVENOUS | Status: AC
  Administered 2013-10-28: 1000 mL via INTRAVENOUS

## 2013-10-28 MED ORDER — POTASSIUM CHLORIDE CRYS ER 20 MEQ PO TBCR
40.0000 meq | EXTENDED_RELEASE_TABLET | Freq: Once | ORAL | Status: AC
  Administered 2013-10-28: 40 meq via ORAL
  Filled 2013-10-28: qty 2

## 2013-10-28 MED ORDER — PROMETHAZINE HCL 25 MG PO TABS: 25.0000 mg | ORAL_TABLET | Freq: Three times a day (TID) | ORAL | Status: DC | PRN

## 2013-10-28 MED ORDER — HYDROCODONE-ACETAMINOPHEN 5-325 MG PO TABS: 1.0000 | ORAL_TABLET | Freq: Four times a day (QID) | ORAL | Status: DC | PRN

## 2013-10-28 MED ORDER — FENTANYL CITRATE 0.05 MG/ML IJ SOLN
50.0000 ug | Freq: Once | INTRAMUSCULAR | Status: AC
  Administered 2013-10-28: 50 ug via INTRAVENOUS
  Filled 2013-10-28: qty 2

## 2013-10-28 NOTE — ED Notes (Signed)
Patient D/C paper work is ready. Waiting for patient potassium to finish before discharging.

## 2013-10-28 NOTE — ED Notes (Signed)
Patient from home accompanied by mother. Patient complains of abdominal pain that started this morning waking her up out of her sleep. Patient has a history of cronhs dx, which she was dx in October. Patient also having N/V, which she has vomited 3-4 times today. Patient also has diarrhea, which she states it was a large amount occuring about 3 times. Patient did go out to eat last night and ate fish. Patient denies HA, SOB, and Chest pain. Patient AXOX3 vitals stable.

## 2013-10-28 NOTE — ED Provider Notes (Signed)
CSN: [id number]     Arrival date & time 10/28/13  1153 History   First MD Initiated Contact with Patient 10/28/13 1334     Chief Complaint  Patient presents with  . Abdominal Pain  . Emesis   (Consider location/radiation/quality/duration/timing/severity/associated sxs/prior Treatment) HPI Julie Ward is a 17 y.o. female with history of recurrent abdominal pain and just recently diagnosed Crohn's disease, presents emergency department complaining of abdominal pain, nausea, vomiting. States her symptoms began this morning and woke her up from sleep. States pain is diffuse but worse in the upper abdomen. States she has had approximately 4 episodes of vomiting. Denies any blood in her emesis. States had few bowel movements today that are loose. Denies any blood in her stool. Denies known fever, chills. Pt took a percocet this morning with no relief.   Past Medical History  Diagnosis Date  . GERD (gastroesophageal reflux disease)   . Allergy   . Dysmenorrhea   . Abdominal pain, recurrent   . Appendicitis   . Crohn's disease of colon 10/14    DX at [facility]    Past Surgical History  Procedure Laterality Date  . Appendectomy  07-14-11    laparoscopic   . Esophagogastroduodenoscopy  01/28/2012    Procedure: ESOPHAGOGASTRODUODENOSCOPY (EGD);  Surgeon: Oletha Blend, MD;  Location: [place];  Service: Gastroenterology;  Laterality: N/A;  . Wisdom tooth extraction    . Cholecystectomy  02/11/2012    Procedure: LAPAROSCOPIC CHOLECYSTECTOMY WITH INTRAOPERATIVE CHOLANGIOGRAM;  Surgeon: Gwenyth Ober, MD;  Location: [place];  Service: General;  Laterality: N/A;   Family History  Problem Relation Age of Onset  . Cancer Paternal Aunt     ovarian, kidney  . Cancer Paternal Uncle     colon  . Colon cancer Paternal Uncle   . Cancer Maternal Grandmother     breast   . Hypertension Maternal Grandmother   . Irritable bowel syndrome Maternal Grandmother   . Heart disease Maternal Grandmother   .  Hypertension Paternal Grandmother   . Hyperlipidemia Paternal Grandmother    History  Substance Use Topics  . Smoking status: Never Smoker   . Smokeless tobacco: Never Used  . Alcohol Use: No     Comment: none   OB History   Grav Para Term Preterm Abortions TAB SAB Ect Mult Living   0              Review of Systems  Constitutional: Negative for fever and chills.  Respiratory: Negative for cough, chest tightness and shortness of breath.   Cardiovascular: Negative for chest pain, palpitations and leg swelling.  Gastrointestinal: Positive for nausea, vomiting, abdominal pain and diarrhea. Negative for blood in stool.  Genitourinary: Negative for dysuria, flank pain and pelvic pain.  Musculoskeletal: Negative for arthralgias, myalgias, neck pain and neck stiffness.  Skin: Negative for rash.  Neurological: Negative for dizziness, weakness and headaches.  All other systems reviewed and are negative.    Allergies  Oxycontin and Penicillins  Home Medications   Current Outpatient Rx  Name  Route  Sig  Dispense  Refill  . budesonide (ENTOCORT EC) 3 MG 24 hr capsule   Oral   Take 3 capsules (9 mg total) by mouth every morning.   90 capsule   4   . dicyclomine (BENTYL) 10 MG capsule   Oral   Take 1 capsule (10 mg total) by mouth 4 (four) times daily -  before meals and at bedtime.   [place]  capsule   1   . esomeprazole (NEXIUM) 40 MG capsule   Oral   Take 40 mg by mouth daily before breakfast.         . oxyCODONE-acetaminophen (PERCOCET/ROXICET) 5-325 MG per tablet   Oral   Take 1 tablet by mouth every 6 (six) hours as needed for pain.   8 tablet   0    BP 129/79  Pulse 86  Temp(Src) 98.6 F (37 C) (Oral)  Resp 20  SpO2 99% Physical Exam  Nursing note and vitals reviewed. Constitutional: She appears well-developed and well-nourished.  Pt is crying hysterically   HENT:  Head: Normocephalic.  Eyes: Conjunctivae are normal.  Neck: Neck supple.  Cardiovascular:  Normal rate, regular rhythm and normal heart sounds.   Pulmonary/Chest: Effort normal and breath sounds normal. No respiratory distress. She has no wheezes. She has no rales.  Abdominal: Soft. Bowel sounds are normal. She exhibits no distension. There is tenderness. There is no rebound.  Diffuse tenderness. Guarding present  Musculoskeletal: She exhibits no edema.  Neurological: She is alert.  Skin: Skin is warm and dry.  Psychiatric: She has a normal mood and affect. Her behavior is normal.    ED Course  Procedures (including critical care time) Labs Review Labs Reviewed  CBC WITH DIFFERENTIAL  COMPREHENSIVE METABOLIC PANEL  LIPASE, BLOOD  URINALYSIS W MICROSCOPIC + REFLEX CULTURE   Imaging Review No results found.  EKG Interpretation   None       MDM  No diagnosis found.  1:57 PM Patient seen and examined. History of Crohn's disease. Patient appears to be in a lot of pain, crying. Pain medications and antibiotics ordered. Fluids ordered. Labs pending  4:15 PM Pain improved mildly, continues to have tenderness. statse pain is still 9/10. Will get CT abdomen and pelvis after discussing pt with Dr. Wilson Singer.   Signed out with PA laywer.   Filed Vitals:   10/28/13 1216 10/28/13 1219 10/28/13 1511  BP: 129/79  126/67  Pulse: 86  75  Temp: 98.6 F (37 C)  99.3 F (37.4 C)  TempSrc: Oral  Oral  Resp: 20  17  SpO2: 99% 99% 100%     Renold Genta, PA-C 10/28/13 1616

## 2013-10-28 NOTE — ED Notes (Signed)
Patient was experiencing burning in IV in the left hand due to K running. Changed IV site and place warm compress.

## 2013-10-31 NOTE — ED Provider Notes (Signed)
Medical screening examination/treatment/procedure(s) were performed by non-physician practitioner and as supervising physician I was immediately available for consultation/collaboration.  EKG Interpretation   None        Virgel Manifold, MD 10/31/13 2056

## 2013-12-02 ENCOUNTER — Telehealth: Payer: Self-pay | Admitting: Family Medicine

## 2013-12-02 NOTE — Telephone Encounter (Signed)
[facility] [address] requesting new script for esomeprazole (NEXIUM) 40 MG capsule #30, last filled 11/11/13

## 2013-12-03 MED ORDER — ESOMEPRAZOLE MAGNESIUM 40 MG PO CPDR: 40.0000 mg | DELAYED_RELEASE_CAPSULE | Freq: Every day | ORAL | Status: DC

## 2013-12-03 NOTE — Telephone Encounter (Signed)
Okay per Dr. Sarajane Jews to send in # 30 with 2 refills and I did send script e-scribe.

## 2014-01-22 ENCOUNTER — Emergency Department (HOSPITAL_COMMUNITY): Payer: BC Managed Care – PPO

## 2014-01-22 ENCOUNTER — Encounter (HOSPITAL_COMMUNITY): Payer: Self-pay | Admitting: Emergency Medicine

## 2014-01-22 ENCOUNTER — Emergency Department (HOSPITAL_COMMUNITY)
Admission: EM | Admit: 2014-01-22 | Discharge: 2014-01-22 | Disposition: A | Discharge status: Home / Self Care | Payer: BC Managed Care – PPO | Attending: Emergency Medicine | Admitting: Emergency Medicine

## 2014-01-22 DIAGNOSIS — Z9049 Acquired absence of other specified parts of digestive tract: Secondary | ICD-10-CM | POA: Insufficient documentation

## 2014-01-22 DIAGNOSIS — R059 Cough, unspecified: Secondary | ICD-10-CM | POA: Insufficient documentation

## 2014-01-22 DIAGNOSIS — K219 Gastro-esophageal reflux disease without esophagitis: Secondary | ICD-10-CM | POA: Insufficient documentation

## 2014-01-22 DIAGNOSIS — Z8742 Personal history of other diseases of the female genital tract: Secondary | ICD-10-CM | POA: Insufficient documentation

## 2014-01-22 DIAGNOSIS — Z880 Allergy status to penicillin: Secondary | ICD-10-CM | POA: Insufficient documentation

## 2014-01-22 DIAGNOSIS — R0789 Other chest pain: Secondary | ICD-10-CM

## 2014-01-22 DIAGNOSIS — R05 Cough: Secondary | ICD-10-CM | POA: Insufficient documentation

## 2014-01-22 DIAGNOSIS — Z9089 Acquired absence of other organs: Secondary | ICD-10-CM | POA: Insufficient documentation

## 2014-01-22 DIAGNOSIS — Z9889 Other specified postprocedural states: Secondary | ICD-10-CM | POA: Insufficient documentation

## 2014-01-22 DIAGNOSIS — R110 Nausea: Secondary | ICD-10-CM | POA: Insufficient documentation

## 2014-01-22 DIAGNOSIS — Z79899 Other long term (current) drug therapy: Secondary | ICD-10-CM | POA: Insufficient documentation

## 2014-01-22 DIAGNOSIS — R071 Chest pain on breathing: Secondary | ICD-10-CM | POA: Insufficient documentation

## 2014-01-22 DIAGNOSIS — J3489 Other specified disorders of nose and nasal sinuses: Secondary | ICD-10-CM | POA: Insufficient documentation

## 2014-01-22 DIAGNOSIS — R197 Diarrhea, unspecified: Secondary | ICD-10-CM | POA: Insufficient documentation

## 2014-01-22 MED ORDER — ONDANSETRON HCL 4 MG PO TABS
4.0000 mg | ORAL_TABLET | Freq: Once | ORAL | Status: AC
  Administered 2014-01-22: 4 mg via ORAL
  Filled 2014-01-22: qty 1

## 2014-01-22 NOTE — ED Notes (Addendum)
Pt coming from urgent care with c/o heartburn and nausea.  Worse with laying down, improved sitting up.  A+ox4.  Skin PWD.  No other complaints.

## 2014-01-22 NOTE — Discharge Instructions (Signed)

## 2014-01-22 NOTE — ED Notes (Signed)
Pt had already used bathroom before POC lab ordered, pt given specimen cup with wipe, will provide when able

## 2014-01-22 NOTE — ED Provider Notes (Signed)
CSN: [id number]     Arrival date & time 01/22/14  1706 History   First MD Initiated Contact with Patient 01/22/14 1712     Chief Complaint  Patient presents with  . Nausea  . Heartburn   HPI Julie Ward is a 18 y.o. female presented to the ED with a complaint of chest discomfort and nausea since Friday, without vomit or fever. She has had Diarrhea once Saturday, although she does have chron's disease. She has been able to eat and drink well. Her LMP was about 2-3 weeks ago, and normal. She states no chance she is pregnant. She does have a history of GERD, Chron's disease appendectomy and cholecystectomy. Patient also has had a partial small bowel removal for chron's. She reportedly takes her Chron's medications and nexium daily. She does not feel like this is heartburn or reflux. She does admit to lifting multiple heavy children on Tuesday. She describes the pain as sharp stabbing pain over her left chest wall, that hurts worse with lying flat and cough. She reports it is reproducible if she pushes on it.    Past Medical History  Diagnosis Date  . GERD (gastroesophageal reflux disease)   . Allergy   . Dysmenorrhea   . Abdominal pain, recurrent   . Appendicitis   . Crohn's disease of colon 10/14    DX at [facility]    Past Surgical History  Procedure Laterality Date  . Appendectomy  07-14-11    laparoscopic   . Esophagogastroduodenoscopy  01/28/2012    Procedure: ESOPHAGOGASTRODUODENOSCOPY (EGD);  Surgeon: Oletha Blend, MD;  Location: [place];  Service: Gastroenterology;  Laterality: N/A;  . Wisdom tooth extraction    . Cholecystectomy  02/11/2012    Procedure: LAPAROSCOPIC CHOLECYSTECTOMY WITH INTRAOPERATIVE CHOLANGIOGRAM;  Surgeon: Gwenyth Ober, MD;  Location: [place];  Service: General;  Laterality: N/A;   Family History  Problem Relation Age of Onset  . Cancer Paternal Aunt     ovarian, kidney  . Cancer Paternal Uncle     colon  . Colon cancer Paternal Uncle   . Cancer Maternal  Grandmother     breast   . Hypertension Maternal Grandmother   . Irritable bowel syndrome Maternal Grandmother   . Heart disease Maternal Grandmother   . Hypertension Paternal Grandmother   . Hyperlipidemia Paternal Grandmother    History  Substance Use Topics  . Smoking status: Never Smoker   . Smokeless tobacco: Never Used  . Alcohol Use: No     Comment: none   OB History   Grav Para Term Preterm Abortions TAB SAB Ect Mult Living   0              Review of Systems  Constitutional: Positive for activity change. Negative for fever, chills, diaphoresis, appetite change, fatigue and unexpected weight change.  HENT: Positive for congestion. Negative for ear discharge, ear pain, facial swelling, hearing loss, nosebleeds, postnasal drip, rhinorrhea, sinus pressure, sneezing and sore throat.   Eyes: Negative for pain, discharge, redness and itching.  Respiratory: Positive for cough. Negative for apnea, chest tightness, shortness of breath and wheezing.   Cardiovascular: Positive for chest pain. Negative for palpitations and leg swelling.  Gastrointestinal: Positive for nausea and diarrhea. Negative for vomiting, abdominal pain, constipation and abdominal distention.  Genitourinary: Negative for dysuria, flank pain and pelvic pain.  Musculoskeletal: Negative for arthralgias.      Allergies  Oxycontin and Penicillins  Home Medications   Current Outpatient Rx  Name  Route  Sig  Dispense  Refill  . budesonide (ENTOCORT EC) 3 MG 24 hr capsule   Oral   Take 3 capsules (9 mg total) by mouth every morning.   90 capsule   4   . dicyclomine (BENTYL) 10 MG capsule   Oral   Take 1 capsule (10 mg total) by mouth 4 (four) times daily -  before meals and at bedtime.   90 capsule   1   . esomeprazole (NEXIUM) 40 MG capsule   Oral   Take 1 capsule (40 mg total) by mouth daily before breakfast.   30 capsule   2   . HYDROcodone-acetaminophen (NORCO/VICODIN) 5-325 MG per tablet    Oral   Take 1 tablet by mouth every 6 (six) hours as needed for moderate pain.   15 tablet   0   . oxyCODONE-acetaminophen (PERCOCET/ROXICET) 5-325 MG per tablet   Oral   Take 1 tablet by mouth every 6 (six) hours as needed for pain.   8 tablet   0    BP 124/78  Pulse 72  Temp(Src) 98.7 F (37.1 C) (Oral)  Resp 18  SpO2 98%  LMP 01/01/2014 Physical Exam Gen: NAD. Sitting up in bed.  HEENT: AT. [place]. Bilateral TM visualized, with mild erythema left TM, no bulging. Bilateral eyes without injections or icterus. MMM. Bilateral nares with erythema and swelling bilateral turbinates. Throat without erythema or exudates.  CV: RRR. No murmur appreciated.  Chest: CTAB, no wheeze or crackles Abd: Soft. NTND. BS present. No masses. laparoscopic umbilical and abdominal scars.  Ext: No erythema. No edema.  Skin: No rashes, purpura or petechiae.  Neuro: Normal gait. PERLA. EOMi. Alert. Grossly intact.  Psych: Normal affect and dress.   ED Course  Procedures (including critical care time) Labs Review Labs Reviewed - No data to display Imaging Review Dg Chest 2 View  01/22/2014   CLINICAL DATA Shortness of breath, nausea, heartburn  EXAM CHEST  2 VIEW  COMPARISON None.  FINDINGS Lungs are clear. No pleural effusion or pneumothorax.  The heart is normal size.  Visualized osseous structures are within normal limits.  IMPRESSION Normal chest radiographs.  SIGNATURE  Electronically Signed   By: Julian Hy M.D.   On: 01/22/2014 18:05     EKG Interpretation   Date/Time:  Tuesday January 22 2014 18:24:54 EDT Ventricular Rate:  77 PR Interval:  148 QRS Duration: 86 QT Interval:  365 QTC Calculation: 413 R Axis:   50 Text Interpretation:  Sinus arrhythmia Normal ECG No old tracing to  compare Confirmed by [place] ([id number]) on 01/22/2014 6:29:01 PM      MDM   Final diagnoses:  Chest wall pain   Patient presented to ED, nontoxic in no acute distress. EKG and cxr  normal.  Patient was given zofran for nausea, with improvement. She is unable to take NSAIDS d/t to her GERD and Chron's. Encouraged her to take tylenol for pain and follow up with her PCP with 3 days. [name], DO 01/23/14 1113

## 2014-01-25 NOTE — ED Provider Notes (Signed)
I saw and evaluated the patient, reviewed the resident's note and I agree with the findings and plan.   EKG Interpretation   Date/Time:  Tuesday January 22 2014 18:24:54 EDT Ventricular Rate:  77 PR Interval:  148 QRS Duration: 86 QT Interval:  365 QTC Calculation: 413 R Axis:   50 Text Interpretation:  Sinus arrhythmia Normal ECG No old tracing to  compare Confirmed by Lailani Tool  MD, Maralee Higuchi ([id number]) on 01/22/2014 6:29:01 PM      Patient with CP. EKG, CXR benign. Possibly from lifting her children. Highly doubt ACS or dissection. Is PERC negative. Will treat symptomatically at home.  Ephraim Hamburger, MD 01/25/14 [phone]

## 2014-02-04 ENCOUNTER — Telehealth: Payer: Self-pay | Admitting: Family Medicine

## 2014-02-04 NOTE — Telephone Encounter (Signed)
[facility] stating esomeprazole (NEXIUM) 40 MG capsule IS NOT a PREFERRED medication BRAND NAME NEXIUM  Is PREFERRED.  Please advise if a medication change is okay or if you want me to proceed with the PA.

## 2014-02-07 NOTE — Telephone Encounter (Signed)
The brand name Nexium is okay

## 2014-02-28 ENCOUNTER — Ambulatory Visit (INDEPENDENT_AMBULATORY_CARE_PROVIDER_SITE_OTHER): Payer: BC Managed Care – PPO | Admitting: Family Medicine

## 2014-02-28 ENCOUNTER — Encounter: Payer: Self-pay | Admitting: Family Medicine

## 2014-02-28 VITALS — BP 104/70 | Temp 99.5°F | Wt 204.0 lb

## 2014-02-28 DIAGNOSIS — M549 Dorsalgia, unspecified: Secondary | ICD-10-CM

## 2014-02-28 DIAGNOSIS — J029 Acute pharyngitis, unspecified: Secondary | ICD-10-CM

## 2014-02-28 DIAGNOSIS — G8929 Other chronic pain: Secondary | ICD-10-CM

## 2014-02-28 LAB — POCT URINALYSIS DIPSTICK
Blood, UA: NEGATIVE
GLUCOSE UA: NEGATIVE
Leukocytes, UA: NEGATIVE
NITRITE UA: NEGATIVE
Spec Grav, UA: 1.02
Urobilinogen, UA: 2
pH, UA: 7

## 2014-02-28 LAB — URINALYSIS, ROUTINE W REFLEX MICROSCOPIC
HGB URINE DIPSTICK: NEGATIVE
KETONES UR: 40 — AB
Leukocytes, UA: NEGATIVE
Nitrite: NEGATIVE
Specific Gravity, Urine: 1.025 (ref 1.000–1.030)
Total Protein, Urine: NEGATIVE
URINE GLUCOSE: NEGATIVE
UROBILINOGEN UA: 2 — AB (ref 0.0–1.0)
pH: 7 (ref 5.0–8.0)

## 2014-02-28 LAB — POCT RAPID STREP A (OFFICE): RAPID STREP A SCREEN: NEGATIVE

## 2014-02-28 MED ORDER — AZITHROMYCIN 250 MG PO TABS: ORAL_TABLET | ORAL | Status: DC

## 2014-02-28 NOTE — Addendum Note (Signed)
Addended by: Joyce Gross R on: 02/28/2014 03:47 PM   Modules accepted: Orders

## 2014-02-28 NOTE — Patient Instructions (Signed)
-  As we discussed, we have prescribed a new medication for you at this appointment. We discussed the common and serious potential adverse effects of this medication and you can review these and more with the pharmacist when you pick up your medication.  Please follow the instructions for use carefully and notify us immediately if you have any problems taking this medication.  INSTRUCTIONS FOR UPPER RESPIRATORY INFECTION:  -plenty of rest and fluids  -nasal saline wash 2-3 times daily (use prepackaged nasal saline or bottled/distilled water if making your own)   -can use sinex or AFRIN nasal spray for drainage and nasal congestion - but do NOT use longer then 3-4 days  -if you are taking a cough medication - use only as directed, may also try a teaspoon of honey to coat the throat and throat lozenges  -for sore throat, salt water gargles can help  -follow up if you have fevers, facial pain, tooth pain, difficulty breathing or are worsening or not getting better in 5-7 days

## 2014-02-28 NOTE — Progress Notes (Signed)
Chief Complaint  Patient presents with  . Sore Throat  . Back Pain    HPI:  Sore throat: -started: about 1 week ago -symptoms:nasal congestion, sore throat, cough -denies:fever, SOB, NVD, tooth pain -has tried: flonase -sick contacts/travel/risks: denies flu exposure, tick exposure or or Ebola risks -Hx of: allergies  Low back pain: -chronic, but wants to check urine to make sure no UTI -denies: fevers, chills, flank pain, dysuria  ROS: See pertinent positives and negatives per HPI.  Past Medical History  Diagnosis Date  . GERD (gastroesophageal reflux disease)   . Allergy   . Dysmenorrhea   . Abdominal pain, recurrent   . Appendicitis   . Crohn's disease of colon 10/14    DX at [facility]     Past Surgical History  Procedure Laterality Date  . Appendectomy  07-14-11    laparoscopic   . Esophagogastroduodenoscopy  01/28/2012    Procedure: ESOPHAGOGASTRODUODENOSCOPY (EGD);  Surgeon: Oletha Blend, MD;  Location: [place];  Service: Gastroenterology;  Laterality: N/A;  . Wisdom tooth extraction    . Cholecystectomy  02/11/2012    Procedure: LAPAROSCOPIC CHOLECYSTECTOMY WITH INTRAOPERATIVE CHOLANGIOGRAM;  Surgeon: Gwenyth Ober, MD;  Location: [place];  Service: General;  Laterality: N/A;    Family History  Problem Relation Age of Onset  . Cancer Paternal Aunt     ovarian, kidney  . Cancer Paternal Uncle     colon  . Colon cancer Paternal Uncle   . Cancer Maternal Grandmother     breast   . Hypertension Maternal Grandmother   . Irritable bowel syndrome Maternal Grandmother   . Heart disease Maternal Grandmother   . Hypertension Paternal Grandmother   . Hyperlipidemia Paternal Grandmother     History   Social History  . Marital Status: Single    Spouse Name: N/A    Number of Children: 0  . Years of Education: N/A   Occupational History  . student    Social History Main Topics  . Smoking status: Never Smoker   . Smokeless tobacco: Never Used  . Alcohol Use:  No     Comment: none  . Drug Use: No  . Sexual Activity: No   Other Topics Concern  . None   Social History Narrative  . None    Current outpatient prescriptions:azithromycin (ZITHROMAX) 250 MG tablet, 2 tabs on day one then one tab daily, Disp: 6 tablet, Rfl: 0;  budesonide (ENTOCORT EC) 3 MG 24 hr capsule, Take 3 capsules (9 mg total) by mouth every morning., Disp: 90 capsule, Rfl: 4;  dicyclomine (BENTYL) 10 MG capsule, Take 1 capsule (10 mg total) by mouth 4 (four) times daily -  before meals and at bedtime., Disp: 90 capsule, Rfl: 1 esomeprazole (NEXIUM) 40 MG capsule, Take 1 capsule (40 mg total) by mouth daily before breakfast., Disp: 30 capsule, Rfl: 2;  [DISCONTINUED] metoCLOPramide (REGLAN) 10 MG tablet, Take 1 tablet (10 mg total) by mouth every 6 (six) hours., Disp: 30 tablet, Rfl: 0  EXAM:  Filed Vitals:   02/28/14 1455  BP: 104/70  Temp: 99.5 F (37.5 C)    Body mass index is 31.94 kg/(m^2).  GENERAL: vitals reviewed and listed above, alert, oriented, appears well hydrated and in no acute distress  HEENT: atraumatic, conjunttiva clear, no obvious abnormalities on inspection of external nose and ears, normal appearance of ear canals and TMs, clear nasal congestion, mild post oropharyngeal erythema with PND, no tonsillar edema or exudate, no sinus TTP  NECK: no obvious masses on inspection  LUNGS: clear to auscultation bilaterally, no wheezes, rales or rhonchi, good air movement  CV: HRRR, no peripheral edema  ABD: soft, NTTP, BS+, no CVA TTP  MS: moves all extremities without noticeable abnormality Normal Gait Normal inspection of back, no obvious scoliosis or leg length descrepancy No bony TTP Soft tissue TTP at: -/+ tests: neg trendelenburg,-facet loading, -SLRT, -CLRT, -FABER, -FADIR Normal muscle strength, sensation to light touch and DTRs in LEs bilaterally   PSYCH: pleasant and cooperative, no obvious depression or anxiety  ASSESSMENT AND  PLAN:  Discussed the following assessment and plan:  Sore throat - Plan: POC Rapid Strep A, azithromycin (ZITHROMAX) 250 MG tablet  Chronic back pain - Plan: Culture, Urine, Urinalysis with Reflex Microscopic, POCT urinalysis dipstick  -given HPI and exam findings today, a serious infection or illness is unlikely. We discussed potential etiologies, with VURI being most likely, and advised supportive care and monitoring. We discussed treatment side effects, likely course, antibiotic misuse, transmission, and signs of developing a serious illness. -rapid strep ok, culture and urine culture pending, advised plenty of fluids as she reports has not been drinking much -back pain chronic and likely musculoskeletal - advised heat for 15 minutes bid and HEP  -follow up in 3-4 months -of course, we advised to return or notify a doctor immediately if symptoms worsen or persist or new concerns arise.    Patient Instructions  -As we discussed, we have prescribed a new medication for you at this appointment. We discussed the common and serious potential adverse effects of this medication and you can review these and more with the pharmacist when you pick up your medication.  Please follow the instructions for use carefully and notify us immediately if you have any problems taking this medication.  INSTRUCTIONS FOR UPPER RESPIRATORY INFECTION:  -plenty of rest and fluids  -nasal saline wash 2-3 times daily (use prepackaged nasal saline or bottled/distilled water if making your own)   -can use sinex or AFRIN nasal spray for drainage and nasal congestion - but do NOT use longer then 3-4 days  -if you are taking a cough medication - use only as directed, may also try a teaspoon of honey to coat the throat and throat lozenges  -for sore throat, salt water gargles can help  -follow up if you have fevers, facial pain, tooth pain, difficulty breathing or are worsening or not getting better in 5-7  days      Lucretia Kern

## 2014-03-01 ENCOUNTER — Ambulatory Visit (INDEPENDENT_AMBULATORY_CARE_PROVIDER_SITE_OTHER): Payer: BC Managed Care – PPO | Admitting: Family Medicine

## 2014-03-01 ENCOUNTER — Telehealth: Payer: Self-pay | Admitting: Family Medicine

## 2014-03-01 ENCOUNTER — Encounter: Payer: Self-pay | Admitting: Family Medicine

## 2014-03-01 VITALS — BP 110/70 | Temp 98.7°F | Ht 67.0 in | Wt 204.0 lb

## 2014-03-01 DIAGNOSIS — K5090 Crohn's disease, unspecified, without complications: Secondary | ICD-10-CM | POA: Insufficient documentation

## 2014-03-01 DIAGNOSIS — M2550 Pain in unspecified joint: Secondary | ICD-10-CM

## 2014-03-01 DIAGNOSIS — B029 Zoster without complications: Secondary | ICD-10-CM

## 2014-03-01 MED ORDER — VALACYCLOVIR HCL 1 G PO TABS: 1000.0000 mg | ORAL_TABLET | Freq: Three times a day (TID) | ORAL | Status: DC

## 2014-03-01 MED ORDER — MOMETASONE FUROATE 50 MCG/ACT NA SUSP: 2.0000 | Freq: Every day | NASAL | Status: DC

## 2014-03-01 NOTE — Telephone Encounter (Signed)
Patient Information:  Caller Name: Julie Ward  Phone: [phone]  Patient: Julie Ward, Julie Ward  Gender: Female  DOB: [birth year]  Age: 18 Years  PCP: Alysia Penna [facility])  Pregnant: No  Office Follow Up:  Does the office need to follow up with this patient?: No  Instructions For The Office: N/A  RN Note:  Patient states she developed right, lower back pain, onset X 1 month. States pain increased 02/28/14. Denies injury. Patient states she is able to stand in an upright position. Patient states back pain is currently at a "6" on a 1-10 scale. Denies urinary sx.  Patient states she has a cluster of raised, red, itchy bumps that developed under her right shoulder blade, onset 02/26/14. States approx. 8 raised areas noted. Denies any blisters. States areas are not painful. States the back pain that she is experiencing is located much lower in her back than where the rash is located. States areas are itchy.  Patient states she has also been coughing up blood, onset 02/28/14. States episode of hemoptysis X 4 03/01/14. Patient states she had a nosebleed from left nares at approx. 1200 03/01/14. States bleeding was controlled after blowing her nose and passing a small clot from her nares. Patient states she was seen in the office 02/28/14 for a sore throat and back pain and was prescribed Afrin. Patient states she had a negative strep screen and a negative urinalysis. Patient states she was given a Rx for Azithromycin but was told not to start unless she was not better in 4-5 days. No wheezing or shortness of breath. Care advcie given per guidelines. Call back parameters reviewed. Patient verbalizes understanding. Patient declines offered appt. at 1415 or 1430 due to travel time to office.   Symptoms  Reason For Call & Symptoms: Back pain, coughing up blood  Reviewed Health History In EMR: Yes  Reviewed Medications In EMR: Yes  Reviewed Allergies In EMR: Yes  Reviewed Surgeries / Procedures: Yes  Date of Onset of Symptoms: 02/28/2014 OB / GYN:  LMP: 02/13/2014  Guideline(s) Used:  Cough  Back Pain  Disposition Per Guideline:   See Today or Tomorrow in Office  Reason For Disposition Reached:   Patient wants to be seen  Advice Given:  Call Back If:  Difficulty breathing  You become worse.  Call Back If:  You become worse.  Patient Will Follow Care Advice:  YES  Appointment Scheduled:  03/01/2014 14:45:00 Appointment Scheduled Provider:  Alysia Penna [facility])

## 2014-03-01 NOTE — Progress Notes (Signed)
Pre visit review using our clinic review tool, if applicable. No additional management support is needed unless otherwise documented below in the visit note. 

## 2014-03-01 NOTE — Progress Notes (Signed)
   Subjective:    Patient ID: Julie Ward, female    DOB: 1996/08/01, 18 y.o.   MRN: [id number]  HPI Here to follow up a few problems. She was seen yesterday for nasal congestion and for back stiffness and pain. She was given a rx for a Zpack but has not gotten this filled. No fever or cough. Also she has had a rash on the back for a week that burns and itches.   Review of Systems  Constitutional: Negative.   HENT: Positive for nosebleeds, postnasal drip and rhinorrhea. Negative for congestion, ear pain and sinus pressure.   Eyes: Negative.   Respiratory: Negative.   Musculoskeletal: Positive for back pain, neck pain and neck stiffness.       Objective:   Physical Exam  Constitutional: She appears well-developed and well-nourished. No distress.  HENT:  Right Ear: External ear normal.  Left Ear: External ear normal.  Nose: Nose normal.  Mouth/Throat: Oropharynx is clear and moist.  Eyes: Conjunctivae are normal.  Neck: Neck supple. No thyromegaly present.  Pulmonary/Chest: Effort normal and breath sounds normal.  Lymphadenopathy:    She has no cervical adenopathy.  Skin:  There is a cluster of red papules and vesicles over the right upper back           Assessment & Plan:  She is having the nosebleeds from Afrin so she will stop using this. Try Nasonex instead. She has a patch of shingles so we will treat with valcyclovir. In light of her inflammatory bowel disease I wonder if the stiffness and pain around her spine my be from an inflammatory arthritis of some sort. We will refer her to [facility]

## 2014-03-02 LAB — CULTURE, GROUP A STREP: ORGANISM ID, BACTERIA: NORMAL

## 2014-03-02 LAB — URINE CULTURE

## 2014-03-04 ENCOUNTER — Encounter: Payer: Self-pay | Admitting: Family Medicine

## 2014-03-04 ENCOUNTER — Ambulatory Visit (INDEPENDENT_AMBULATORY_CARE_PROVIDER_SITE_OTHER): Payer: BC Managed Care – PPO | Admitting: Family Medicine

## 2014-03-04 VITALS — BP 112/74 | Temp 99.2°F | Ht 67.0 in | Wt 204.0 lb

## 2014-03-04 DIAGNOSIS — B9789 Other viral agents as the cause of diseases classified elsewhere: Secondary | ICD-10-CM

## 2014-03-04 DIAGNOSIS — B349 Viral infection, unspecified: Secondary | ICD-10-CM

## 2014-03-04 LAB — CBC WITH DIFFERENTIAL/PLATELET
BASOS PCT: 0.3 % (ref 0.0–3.0)
Basophils Absolute: 0 10*3/uL (ref 0.0–0.1)
Eosinophils Absolute: 0 10*3/uL (ref 0.0–0.7)
Eosinophils Relative: 0.6 % (ref 0.0–5.0)
HCT: 37.7 % (ref 36.0–46.0)
HEMOGLOBIN: 12.8 g/dL (ref 12.0–15.0)
Lymphocytes Relative: 30.7 % (ref 12.0–46.0)
Lymphs Abs: 2 10*3/uL (ref 0.7–4.0)
MCHC: 34 g/dL (ref 30.0–36.0)
MCV: 89.9 fl (ref 78.0–100.0)
MONOS PCT: 11.7 % (ref 3.0–12.0)
Monocytes Absolute: 0.8 10*3/uL (ref 0.1–1.0)
NEUTROS ABS: 3.7 10*3/uL (ref 1.4–7.7)
Neutrophils Relative %: 56.7 % (ref 43.0–77.0)
Platelets: 296 10*3/uL (ref 150.0–400.0)
RBC: 4.19 Mil/uL (ref 3.87–5.11)
RDW: 12.8 % (ref 11.5–14.6)
WBC: 6.6 10*3/uL (ref 4.5–10.5)

## 2014-03-04 LAB — HEPATIC FUNCTION PANEL
ALK PHOS: 49 U/L (ref 39–117)
ALT: 8 U/L (ref 0–35)
AST: 12 U/L (ref 0–37)
Albumin: 3.4 g/dL — ABNORMAL LOW (ref 3.5–5.2)
Bilirubin, Direct: 0 mg/dL (ref 0.0–0.3)
Total Bilirubin: 0.8 mg/dL (ref 0.3–1.2)
Total Protein: 7.6 g/dL (ref 6.0–8.3)

## 2014-03-04 LAB — BASIC METABOLIC PANEL
BUN: 8 mg/dL (ref 6–23)
CALCIUM: 9 mg/dL (ref 8.4–10.5)
CO2: 26 mEq/L (ref 19–32)
Chloride: 104 mEq/L (ref 96–112)
Creatinine, Ser: 0.6 mg/dL (ref 0.4–1.2)
GFR: 167.65 mL/min (ref >60.00)
Glucose, Bld: 84 mg/dL (ref 70–99)
Potassium: 3.2 mEq/L — ABNORMAL LOW (ref 3.5–5.1)
SODIUM: 137 meq/L (ref 135–145)

## 2014-03-04 MED ORDER — HYDROCODONE-ACETAMINOPHEN 5-325 MG PO TABS: 1.0000 | ORAL_TABLET | Freq: Four times a day (QID) | ORAL | Status: DC | PRN

## 2014-03-04 MED ORDER — METHYLPREDNISOLONE 4 MG PO KIT: PACK | ORAL | Status: AC

## 2014-03-04 NOTE — Progress Notes (Signed)
Pre visit review using our clinic review tool, if applicable. No additional management support is needed unless otherwise documented below in the visit note. 

## 2014-03-04 NOTE — Progress Notes (Signed)
   Subjective:    Patient ID: Julie Ward, female    DOB: [birth year], 18 y.o.   MRN: [id number]  HPI Here to follow up on body aches and ST. Over the past weekend her ST has gotten worse and now she has trouble swallowing anything. She has some diffuse abdominal pains but no nausea or vomiting. She has some low back pains. No cough. Her urine and throat cultures from last week came back as negative.    Review of Systems  Constitutional: Negative.   HENT: Positive for sore throat and trouble swallowing. Negative for congestion, postnasal drip and sinus pressure.   Eyes: Negative.   Respiratory: Negative.   Gastrointestinal: Positive for abdominal pain. Negative for nausea, vomiting, diarrhea, constipation, blood in stool and abdominal distention.       Objective:   Physical Exam  Constitutional: She appears well-developed and well-nourished.  HENT:  Right Ear: External ear normal.  Left Ear: External ear normal.  Nose: Nose normal.  Posterior OP is red without exudate   Eyes: Conjunctivae are normal.  Neck: Neck supple.  Tender shotty AC nodes   Pulmonary/Chest: Effort normal and breath sounds normal.  Abdominal: Soft. Bowel sounds are normal. She exhibits no distension and no mass. There is no rebound and no guarding.  Diffusely tender with no HSM           Assessment & Plan:  This is possibly mononucleosis. Try a Medrol dose pack to reduce inflammation and use Vicodin for pain. Drink plenty of fluids. Get labs including EBV titers

## 2014-03-05 ENCOUNTER — Other Ambulatory Visit: Payer: Self-pay | Admitting: Family Medicine

## 2014-03-05 ENCOUNTER — Encounter: Payer: BC Managed Care – PPO | Admitting: Gynecology

## 2014-03-05 LAB — EPSTEIN-BARR VIRUS VCA, IGG: EBV VCA IgG: >750 U/mL — ABNORMAL HIGH (ref <18.0)

## 2014-03-05 LAB — EPSTEIN-BARR VIRUS VCA, IGM: EBV VCA IgM: <10 U/mL (ref <36.0)

## 2014-03-05 MED ORDER — POTASSIUM CHLORIDE ER 10 MEQ PO TBCR: 10.0000 meq | EXTENDED_RELEASE_TABLET | Freq: Two times a day (BID) | ORAL | Status: DC

## 2014-03-05 NOTE — Addendum Note (Signed)
Addended by: Alysia Penna A on: 03/05/2014 05:39 PM   Modules accepted: Orders

## 2014-03-07 ENCOUNTER — Encounter (HOSPITAL_COMMUNITY): Payer: Self-pay | Admitting: Emergency Medicine

## 2014-03-07 ENCOUNTER — Emergency Department (HOSPITAL_COMMUNITY)
Admission: EM | Admit: 2014-03-07 | Discharge: 2014-03-07 | Disposition: A | Discharge status: Home / Self Care | Payer: BC Managed Care – PPO | Attending: Emergency Medicine | Admitting: Emergency Medicine

## 2014-03-07 ENCOUNTER — Telehealth: Payer: Self-pay | Admitting: Family Medicine

## 2014-03-07 DIAGNOSIS — K219 Gastro-esophageal reflux disease without esophagitis: Secondary | ICD-10-CM | POA: Insufficient documentation

## 2014-03-07 DIAGNOSIS — K5010 Crohn's disease of large intestine without complications: Secondary | ICD-10-CM | POA: Insufficient documentation

## 2014-03-07 DIAGNOSIS — L509 Urticaria, unspecified: Secondary | ICD-10-CM

## 2014-03-07 DIAGNOSIS — L500 Allergic urticaria: Secondary | ICD-10-CM | POA: Insufficient documentation

## 2014-03-07 DIAGNOSIS — Z8742 Personal history of other diseases of the female genital tract: Secondary | ICD-10-CM | POA: Insufficient documentation

## 2014-03-07 DIAGNOSIS — Z8619 Personal history of other infectious and parasitic diseases: Secondary | ICD-10-CM | POA: Insufficient documentation

## 2014-03-07 DIAGNOSIS — Z880 Allergy status to penicillin: Secondary | ICD-10-CM | POA: Insufficient documentation

## 2014-03-07 DIAGNOSIS — M549 Dorsalgia, unspecified: Secondary | ICD-10-CM | POA: Insufficient documentation

## 2014-03-07 DIAGNOSIS — Z79899 Other long term (current) drug therapy: Secondary | ICD-10-CM | POA: Insufficient documentation

## 2014-03-07 DIAGNOSIS — IMO0002 Reserved for concepts with insufficient information to code with codable children: Secondary | ICD-10-CM | POA: Insufficient documentation

## 2014-03-07 MED ORDER — FAMOTIDINE IN NACL 20-0.9 MG/50ML-% IV SOLN
20.0000 mg | Freq: Once | INTRAVENOUS | Status: AC
  Administered 2014-03-07: 20 mg via INTRAVENOUS
  Filled 2014-03-07: qty 50

## 2014-03-07 MED ORDER — FAMOTIDINE 20 MG PO TABS: 20.0000 mg | ORAL_TABLET | Freq: Two times a day (BID) | ORAL | Status: DC

## 2014-03-07 MED ORDER — DIPHENHYDRAMINE HCL 25 MG PO TABS: 25.0000 mg | ORAL_TABLET | Freq: Four times a day (QID) | ORAL | Status: DC

## 2014-03-07 MED ORDER — DIPHENHYDRAMINE HCL 50 MG/ML IJ SOLN
25.0000 mg | Freq: Once | INTRAMUSCULAR | Status: AC
  Administered 2014-03-07: 25 mg via INTRAVENOUS
  Filled 2014-03-07: qty 1

## 2014-03-07 MED ORDER — LORAZEPAM 2 MG/ML IJ SOLN
1.0000 mg | Freq: Once | INTRAMUSCULAR | Status: AC
  Administered 2014-03-07: 1 mg via INTRAVENOUS
  Filled 2014-03-07: qty 1

## 2014-03-07 NOTE — Telephone Encounter (Signed)
Pt has been schedule.

## 2014-03-07 NOTE — Discharge Instructions (Signed)
Please follow up with your doctor to go over your recent lab results.  Take medications as prescribed.  Return to the ER for worsening condition or new concerning symptoms.  Do not scratch at the hives should they return-it will only make it worse!  Take a cool shower or bath with aveeno bath packets or similar product.   Hives Hives are itchy, red, swollen areas of the skin. They can vary in size and location on your body. Hives can come and go for hours or several days (acute hives) or for several weeks (chronic hives). Hives do not spread from person to person (noncontagious). They may get worse with scratching, exercise, and emotional stress. CAUSES   Allergic reaction to food, additives, or drugs.  Infections, including the common cold.  Illness, such as vasculitis, lupus, or thyroid disease.  Exposure to sunlight, heat, or cold.  Exercise.  Stress.  Contact with chemicals. SYMPTOMS   Red or white swollen patches on the skin. The patches may change size, shape, and location quickly and repeatedly.  Itching.  Swelling of the hands, feet, and face. This may occur if hives develop deeper in the skin. DIAGNOSIS  Your caregiver can usually tell what is wrong by performing a physical exam. Skin or blood tests may also be done to determine the cause of your hives. In some cases, the cause cannot be determined. TREATMENT  Mild cases usually get better with medicines such as antihistamines. Severe cases may require an emergency epinephrine injection. If the cause of your hives is known, treatment includes avoiding that trigger.  HOME CARE INSTRUCTIONS   Avoid causes that trigger your hives.  Take antihistamines as directed by your caregiver to reduce the severity of your hives. Non-sedating or low-sedating antihistamines are usually recommended. Do not drive while taking an antihistamine.  Take any other medicines prescribed for itching as directed by your caregiver.  Wear  loose-fitting clothing.  Keep all follow-up appointments as directed by your caregiver. SEEK MEDICAL CARE IF:   You have persistent or severe itching that is not relieved with medicine.  You have painful or swollen joints. SEEK IMMEDIATE MEDICAL CARE IF:   You have a fever.  Your tongue or lips are swollen.  You have trouble breathing or swallowing.  You feel tightness in the throat or chest.  You have abdominal pain. These problems may be the first sign of a life-threatening allergic reaction. Call your local emergency services (911 in [place] MAKE SURE YOU:   Understand these instructions.  Will watch your condition.  Will get help right away if you are not doing well or get worse. Document Released: 11/01/2005 Document Revised: 05/02/2012 Document Reviewed: 01/25/2012 [facility] Patient Information 2014 [employer].

## 2014-03-07 NOTE — Telephone Encounter (Signed)
Pt mom is aware waiting on md

## 2014-03-07 NOTE — Telephone Encounter (Signed)
Patient Information:  Caller Name: [facility]  Phone: [phone]  Patient: Julie Ward, Julie Ward  Gender: Female  DOB: 05/04/1996  Age: 18 Years  PCP: Alysia Penna [facility])  Pregnant: No  Office Follow Up:  Does the office need to follow up with this patient?: Yes  Instructions For The Office: ER requested she follow up with the office regarding the hives.  Her new medications is Valtrex and steroids.  ER physician requesting review of medication and treatment.PLEASE CONTACT PATIENT .  RN Note:  ER requested she follow up with the office regarding the hives.  Her new medications is Valtrex and steroids.  ER physician requesting review of medication and treatment.PLEASE CONTACT PATIENT .  Symptoms  Reason For Call & Symptoms: Mother states Belem was seen in the office 4/16 , 4/17/ and 4/20 . She was diagnosed with Shingles and Mono.  She was given Valtrex and steroids..  She did not take any medications yesterday 03/06/14  because she had an appt with Rheumatologist.  Onset this morning at 04:00 with hives "back , hands arms , neck , EVERY WHERE  and rushed her to [facility].. She given Benadryl, Pepcid and Ativan and told to follow up with PCP.  Hives have now went away.  Reviewed Health History In EMR: Yes  Reviewed Medications In EMR: Yes  Reviewed Allergies In EMR: Yes  Reviewed Surgeries / Procedures: Yes  Date of Onset of Symptoms: 03/06/2014  Treatments Tried: Benadryl  Treatments Tried Worked: Yes OB / GYN:  LMP: 01/31/2014  Guideline(s) Used:  Rash or Redness - Widespread  Hives  Disposition Per Guideline:   Home Care  Reason For Disposition Reached:   Widespread hives  Advice Given:  Call Back If:  Severe hives or severe itching persist more than 24 hours despite taking an antihistamine (e.g., Benadryl)  You become worse.  Widespread Hives:  Take a cool bath for 10 minutes to relieve itching. Rub very itchy areas with an ice cube for 10 minutes.  RN  Overrode Recommendation:  Document Patient  ER requested she follow up with the office regarding the hives.  Her new medications is Valtrex and steroids.  ER physician requesting review of medication and treatment.PLEASE CONTACT PATIENT .

## 2014-03-07 NOTE — ED Notes (Signed)
Pt c/o allergic reaction intermittent x 3 days. Hives noted to arms/legs/torso. No airway involvement.

## 2014-03-07 NOTE — Telephone Encounter (Signed)
Can you call mom back to schedule? I spoke with pt's mom and she would like to schedule office visit for 03/08/14. Okay to schedule per Dr. Sarajane Jews and only needs a 15 minute visit.

## 2014-03-07 NOTE — ED Provider Notes (Signed)
CSN: [id number]     Arrival date & time 03/07/14  0449 History   First MD Initiated Contact with Patient 03/07/14 0518     Chief Complaint  Patient presents with  . Allergic Reaction     (Consider location/radiation/quality/duration/timing/severity/associated sxs/prior Treatment) HPI 18 year old presents to emergency room with complaint of diffuse itching and hives.  Symptoms ongoing for last 3 days.  Patient with history of Crohn's.  Over the last 2 weeks, patient has had sore throat, back pain, rash to right shoulder, myalgias.  No fevers or chills.  Patient was seen at her primary care clinic on the 16th, had negative strep test, instructed to do supportive care.  She was seen on the following day in clinic, diagnosed with shingles, and started on Valtrex.  she was seen again on the 20th, had labs and Mono titers done, as she was complaining of worsening throat, swelling, and pain.  She was referred to rheumatology due to ongoing back pain, and history of Crohn's concern for inflammatory disease.  Patient was seen yesterday by rheumatology.  Labs were drawn, but no medications started as of yet.  Patient was started on Medrol Dosepak on the 20th.  Since being seen on the 20th, patient has developed diffuse urticaria.  She is placed calamine lotion on the itchy areas, but has not taken anything over-the-counter orally for itching.   Past Medical History  Diagnosis Date  . GERD (gastroesophageal reflux disease)   . Allergy   . Dysmenorrhea   . Abdominal pain, recurrent   . Appendicitis   . Crohn's disease of colon 10/14    sees Dr. Blanca Friend at [facility]    Past Surgical History  Procedure Laterality Date  . Appendectomy  07-14-11    laparoscopic   . Esophagogastroduodenoscopy  01/28/2012    Procedure: ESOPHAGOGASTRODUODENOSCOPY (EGD);  Surgeon: Oletha Blend, MD;  Location: [place];  Service: Gastroenterology;  Laterality: N/A;  . Wisdom tooth extraction    . Cholecystectomy   02/11/2012    Procedure: LAPAROSCOPIC CHOLECYSTECTOMY WITH INTRAOPERATIVE CHOLANGIOGRAM;  Surgeon: Gwenyth Ober, MD;  Location: [place];  Service: General;  Laterality: N/A;   Family History  Problem Relation Age of Onset  . Cancer Paternal Aunt     ovarian, kidney  . Cancer Paternal Uncle     colon  . Colon cancer Paternal Uncle   . Cancer Maternal Grandmother     breast   . Hypertension Maternal Grandmother   . Irritable bowel syndrome Maternal Grandmother   . Heart disease Maternal Grandmother   . Hypertension Paternal Grandmother   . Hyperlipidemia Paternal Grandmother    History  Substance Use Topics  . Smoking status: Never Smoker   . Smokeless tobacco: Never Used  . Alcohol Use: No     Comment: none   OB History   Grav Para Term Preterm Abortions TAB SAB Ect Mult Living   0              Review of Systems  See History of Present Illness; otherwise all other systems are reviewed and negative   Allergies  Oxycontin and Penicillins  Home Medications   Prior to Admission medications   Medication Sig Start Date End Date Taking? Authorizing Provider  budesonide (ENTOCORT EC) 3 MG 24 hr capsule Take 3 capsules (9 mg total) by mouth every morning. 09/10/13   Sable Feil, MD  dicyclomine (BENTYL) 10 MG capsule Take 1 capsule (10 mg total) by mouth 4 (  four) times daily -  before meals and at bedtime. 08/28/13   Laban Emperor. Zehr, PA-C  HYDROcodone-acetaminophen (NORCO/VICODIN) 5-325 MG per tablet Take 1 tablet by mouth every 6 (six) hours as needed for moderate pain. 03/04/14   Laurey Morale, MD  methylPREDNISolone (MEDROL DOSEPAK) 4 MG tablet follow package directions 03/04/14 03/11/14  Laurey Morale, MD  mometasone (NASONEX) 50 MCG/ACT nasal spray Place 2 sprays into the nose daily. 03/01/14   Laurey Morale, MD  NEXIUM 40 MG capsule TAKE 1 CAPSULE (40 MG TOTAL) BY MOUTH DAILY BEFORE BREAKFAST.    Laurey Morale, MD  potassium chloride (KLOR-CON 10) 10 MEQ tablet Take 1  tablet (10 mEq total) by mouth 2 (two) times daily. 03/05/14   Laurey Morale, MD  valACYclovir (VALTREX) 1000 MG tablet Take 1 tablet (1,000 mg total) by mouth 3 (three) times daily. 03/01/14   Laurey Morale, MD   BP 131/73  Pulse 91  Temp(Src) 97.8 F (36.6 C) (Oral)  Resp 18  Ht 5' 7"  (1.702 m)  Wt 200 lb (90.719 kg)  BMI 31.32 kg/m2  SpO2 99%  LMP 02/14/2014 Physical Exam  Nursing note and vitals reviewed. Constitutional: She is oriented to person, place, and time. She appears well-developed and well-nourished. She appears distressed.  HENT:  Head: Normocephalic and atraumatic.  Right Ear: External ear normal.  Left Ear: External ear normal.  Nose: Nose normal.  Eyes: Conjunctivae and EOM are normal. Pupils are equal, round, and reactive to light.  Neck: Normal range of motion. Neck supple. No JVD present. No tracheal deviation present. No thyromegaly present.  Cardiovascular: Normal rate, regular rhythm, normal heart sounds and intact distal pulses.  Exam reveals no gallop and no friction rub.   No murmur heard. Pulmonary/Chest: Effort normal and breath sounds normal. No stridor. No respiratory distress. She has no wheezes. She has no rales. She exhibits no tenderness.  Abdominal: Soft. Bowel sounds are normal. She exhibits no distension and no mass. There is no tenderness. There is no rebound and no guarding.  Musculoskeletal: Normal range of motion. She exhibits no edema and no tenderness.  Lymphadenopathy:    She has no cervical adenopathy.  Neurological: She is alert and oriented to person, place, and time. She exhibits normal muscle tone. Coordination normal.  Skin: Skin is dry. Rash (urticaria noted to.  Patient has secondary excoriations to back.  She has a small patch of raised papules to right shoulder, without ulceration or blistering.) noted. No erythema. No pallor.  Psychiatric: She has a normal mood and affect. Her behavior is normal. Judgment and thought content  normal.    ED Course  Procedures (including critical care time) Labs Review Labs Reviewed - No data to display  Imaging Review No results found.   EKG Interpretation None      MDM   Final diagnoses:  Urticaria    18 year old female with urticaria.  Plan to give Benadryl, and Pepcid to help with histamine reaction.  Patient has artery on steroid.  We'll give a dose of Ativan as patient is very agitated and upset about ongoing medical issues.  6:52 AM  Hives have resolved, itching resolved.  Will d/c home with benadryl, pepcid, and close f/u with her pcm.  Kalman Drape, MD 03/07/14 [phone]

## 2014-03-07 NOTE — Telephone Encounter (Signed)
It sounds like the hives are under control so I am not sure we need to do anything now. If she wants we can see her again tomorrow

## 2014-03-08 ENCOUNTER — Encounter: Payer: Self-pay | Admitting: Family Medicine

## 2014-03-08 ENCOUNTER — Ambulatory Visit (INDEPENDENT_AMBULATORY_CARE_PROVIDER_SITE_OTHER): Payer: BC Managed Care – PPO | Admitting: Family Medicine

## 2014-03-08 VITALS — BP 109/80 | HR 83 | Temp 98.0°F | Ht 67.0 in | Wt 200.0 lb

## 2014-03-08 DIAGNOSIS — L509 Urticaria, unspecified: Secondary | ICD-10-CM | POA: Insufficient documentation

## 2014-03-08 NOTE — Progress Notes (Signed)
Pre visit review using our clinic review tool, if applicable. No additional management support is needed unless otherwise documented below in the visit note. 

## 2014-03-08 NOTE — Progress Notes (Signed)
   Subjective:    Patient ID: Julie Ward, female    DOB: May 20, 1996, 18 y.o.   MRN: [id number]  HPI Here to follow up an ER visit yesterday for hives. She had been taking some prednisone for this but this seemed to make her worse. The hives were causing intense itching but not SOB. She was told to take Benadryl and Pepcid, which she has done. She decided the steroids were the source of the problem so she stopped taking these. Today she feels great and is back top normal.    Review of Systems  Constitutional: Negative.   Respiratory: Negative.   Cardiovascular: Negative.   Skin: Negative.        Objective:   Physical Exam  Constitutional: She appears well-developed and well-nourished.  Cardiovascular: Normal rate, regular rhythm, normal heart sounds and intact distal pulses.   Pulmonary/Chest: Effort normal and breath sounds normal.  Skin: No rash noted. No erythema.          Assessment & Plan:  This make in fact have been a reaction to the steroids, so we will avoid these in the future. Follow up prn

## 2014-03-26 ENCOUNTER — Telehealth: Payer: Self-pay | Admitting: Family Medicine

## 2014-03-26 DIAGNOSIS — M2550 Pain in unspecified joint: Secondary | ICD-10-CM

## 2014-03-26 NOTE — Telephone Encounter (Signed)
Pt's mother called stating pt was seen by Dr. Ouida Sills, Rheumatology, she would like another referral to see a different Rheumatologist.  Pt's mother states Dr. Ouida Sills is not the right doctor for her daughter and the staff is very unprofessional/rude.  The staff also refuses to release the pt's lab results.   They would like to know if you are able to obtain the lab results while the pt is being referred to a different Rheumatologist?

## 2014-03-27 NOTE — Telephone Encounter (Signed)
I referred her to Dr. Estanislado Pandy, maybe this will be a better fit for her

## 2014-03-27 NOTE — Telephone Encounter (Signed)
I left message with this information.

## 2014-04-06 ENCOUNTER — Emergency Department (HOSPITAL_COMMUNITY): Payer: BC Managed Care – PPO

## 2014-04-06 ENCOUNTER — Encounter (HOSPITAL_COMMUNITY): Payer: Self-pay | Admitting: Emergency Medicine

## 2014-04-06 ENCOUNTER — Emergency Department (HOSPITAL_COMMUNITY)
Admission: EM | Admit: 2014-04-06 | Discharge: 2014-04-06 | Disposition: A | Discharge status: Home / Self Care | Payer: BC Managed Care – PPO | Attending: Emergency Medicine | Admitting: Emergency Medicine

## 2014-04-06 DIAGNOSIS — Z8742 Personal history of other diseases of the female genital tract: Secondary | ICD-10-CM | POA: Insufficient documentation

## 2014-04-06 DIAGNOSIS — Z79899 Other long term (current) drug therapy: Secondary | ICD-10-CM | POA: Insufficient documentation

## 2014-04-06 DIAGNOSIS — K219 Gastro-esophageal reflux disease without esophagitis: Secondary | ICD-10-CM | POA: Insufficient documentation

## 2014-04-06 DIAGNOSIS — Z9889 Other specified postprocedural states: Secondary | ICD-10-CM | POA: Insufficient documentation

## 2014-04-06 DIAGNOSIS — Z9089 Acquired absence of other organs: Secondary | ICD-10-CM | POA: Insufficient documentation

## 2014-04-06 DIAGNOSIS — E876 Hypokalemia: Secondary | ICD-10-CM

## 2014-04-06 DIAGNOSIS — Z3202 Encounter for pregnancy test, result negative: Secondary | ICD-10-CM | POA: Insufficient documentation

## 2014-04-06 DIAGNOSIS — IMO0002 Reserved for concepts with insufficient information to code with codable children: Secondary | ICD-10-CM | POA: Insufficient documentation

## 2014-04-06 DIAGNOSIS — Z880 Allergy status to penicillin: Secondary | ICD-10-CM | POA: Insufficient documentation

## 2014-04-06 DIAGNOSIS — K5010 Crohn's disease of large intestine without complications: Secondary | ICD-10-CM | POA: Insufficient documentation

## 2014-04-06 DIAGNOSIS — R109 Unspecified abdominal pain: Secondary | ICD-10-CM

## 2014-04-06 LAB — CBC WITH DIFFERENTIAL/PLATELET
Basophils Absolute: 0 10*3/uL (ref 0.0–0.1)
Basophils Relative: 0 % (ref 0–1)
EOS ABS: 0 10*3/uL (ref 0.0–0.7)
EOS PCT: 0 % (ref 0–5)
HEMATOCRIT: 38.6 % (ref 36.0–46.0)
HEMOGLOBIN: 13.2 g/dL (ref 12.0–15.0)
LYMPHS ABS: 1 10*3/uL (ref 0.7–4.0)
Lymphocytes Relative: 21 % (ref 12–46)
MCH: 30 pg (ref 26.0–34.0)
MCHC: 34.2 g/dL (ref 30.0–36.0)
MCV: 87.7 fL (ref 78.0–100.0)
MONOS PCT: 8 % (ref 3–12)
Monocytes Absolute: 0.4 10*3/uL (ref 0.1–1.0)
Neutro Abs: 3.3 10*3/uL (ref 1.7–7.7)
Neutrophils Relative %: 71 % (ref 43–77)
Platelets: 260 10*3/uL (ref 150–400)
RBC: 4.4 MIL/uL (ref 3.87–5.11)
RDW: 12.2 % (ref 11.5–15.5)
WBC: 4.7 10*3/uL (ref 4.0–10.5)

## 2014-04-06 LAB — URINALYSIS, ROUTINE W REFLEX MICROSCOPIC
BILIRUBIN URINE: NEGATIVE
Glucose, UA: NEGATIVE mg/dL
HGB URINE DIPSTICK: NEGATIVE
Ketones, ur: 15 mg/dL — AB
Leukocytes, UA: NEGATIVE
NITRITE: NEGATIVE
PROTEIN: 30 mg/dL — AB
Specific Gravity, Urine: 1.02 (ref 1.005–1.030)
Urobilinogen, UA: 1 mg/dL (ref 0.0–1.0)
pH: 8.5 — ABNORMAL HIGH (ref 5.0–8.0)

## 2014-04-06 LAB — COMPREHENSIVE METABOLIC PANEL
ALK PHOS: 61 U/L (ref 39–117)
ALT: 7 U/L (ref 0–35)
AST: 12 U/L (ref 0–37)
Albumin: 3.8 g/dL (ref 3.5–5.2)
BUN: 8 mg/dL (ref 6–23)
CALCIUM: 9.5 mg/dL (ref 8.4–10.5)
CO2: 20 mEq/L (ref 19–32)
Chloride: 102 mEq/L (ref 96–112)
Creatinine, Ser: 0.71 mg/dL (ref 0.50–1.10)
GLUCOSE: 109 mg/dL — AB (ref 70–99)
Potassium: 3.5 mEq/L — ABNORMAL LOW (ref 3.7–5.3)
SODIUM: 139 meq/L (ref 137–147)
TOTAL PROTEIN: 7.7 g/dL (ref 6.0–8.3)
Total Bilirubin: 0.6 mg/dL (ref 0.3–1.2)

## 2014-04-06 LAB — URINE MICROSCOPIC-ADD ON

## 2014-04-06 LAB — PREGNANCY, URINE: Preg Test, Ur: NEGATIVE

## 2014-04-06 LAB — LIPASE, BLOOD: Lipase: 24 U/L (ref 11–59)

## 2014-04-06 MED ORDER — MORPHINE SULFATE 4 MG/ML IJ SOLN
4.0000 mg | Freq: Once | INTRAMUSCULAR | Status: AC
  Administered 2014-04-06: 4 mg via INTRAVENOUS
  Filled 2014-04-06: qty 1

## 2014-04-06 MED ORDER — IOHEXOL 300 MG/ML  SOLN
50.0000 mL | Freq: Once | INTRAMUSCULAR | Status: AC | PRN
  Administered 2014-04-06: 50 mL via ORAL

## 2014-04-06 MED ORDER — SODIUM CHLORIDE 0.9 % IV BOLUS (SEPSIS)
1000.0000 mL | Freq: Once | INTRAVENOUS | Status: AC
  Administered 2014-04-06: 1000 mL via INTRAVENOUS

## 2014-04-06 MED ORDER — IOHEXOL 300 MG/ML  SOLN
100.0000 mL | Freq: Once | INTRAMUSCULAR | Status: AC | PRN
  Administered 2014-04-06: 100 mL via INTRAVENOUS

## 2014-04-06 MED ORDER — ONDANSETRON HCL 4 MG PO TABS: 4.0000 mg | ORAL_TABLET | Freq: Four times a day (QID) | ORAL | Status: DC

## 2014-04-06 MED ORDER — ONDANSETRON HCL 4 MG/2ML IJ SOLN
4.0000 mg | Freq: Once | INTRAMUSCULAR | Status: AC
  Administered 2014-04-06: 4 mg via INTRAVENOUS
  Filled 2014-04-06: qty 2

## 2014-04-06 NOTE — ED Provider Notes (Signed)
CSN: [id number]     Arrival date & time 04/06/14  1253 History   First MD Initiated Contact with Patient 04/06/14 1311     Chief Complaint  Patient presents with  . Abdominal Pain     (Consider location/radiation/quality/duration/timing/severity/associated sxs/prior Treatment) HPI Comments: Patient is a 18 yo F PMHx significant for GERD, Crohn's disease, Dysmenorrhea presenting to the ED for five days of gradually worsening generalized severe cramping abdominal pain with associated nausea and five episodes of non-bloody non-bilious emesis that began today. Patient has had associated generalized weakness as well. Patient had a similar episode back in October for a crohn's flare up. Alleviating factors: nothing. Aggravating factors: "everything." Patient has not tried any pain medications at home. She has not called her GI doctor at [facility] since the onset of the pain. Denies any fevers, chills, diarrhea, constipation, vaginal symptoms, urinary symptoms. Abdominal surgical history includes appendectomy, cholecystectomy. Patient has been taking her Entocort as prescribed.    Patient is a 18 y.o. female presenting with abdominal pain.  Abdominal Pain Associated symptoms: nausea and vomiting   Associated symptoms: no chills, no constipation, no diarrhea and no fever     Past Medical History  Diagnosis Date  . GERD (gastroesophageal reflux disease)   . Allergy   . Dysmenorrhea   . Abdominal pain, recurrent   . Appendicitis   . Crohn's disease of colon 10/14    sees Dr. Blanca Friend at [facility] GI    Past Surgical History  Procedure Laterality Date  . Appendectomy  07-14-11    laparoscopic   . Esophagogastroduodenoscopy  01/28/2012    Procedure: ESOPHAGOGASTRODUODENOSCOPY (EGD);  Surgeon: Oletha Blend, MD;  Location: [place];  Service: Gastroenterology;  Laterality: N/A;  . Wisdom tooth extraction    . Cholecystectomy  02/11/2012    Procedure: LAPAROSCOPIC CHOLECYSTECTOMY WITH  INTRAOPERATIVE CHOLANGIOGRAM;  Surgeon: Gwenyth Ober, MD;  Location: [place];  Service: General;  Laterality: N/A;   Family History  Problem Relation Age of Onset  . Cancer Paternal Aunt     ovarian, kidney  . Cancer Paternal Uncle     colon  . Colon cancer Paternal Uncle   . Cancer Maternal Grandmother     breast   . Hypertension Maternal Grandmother   . Irritable bowel syndrome Maternal Grandmother   . Heart disease Maternal Grandmother   . Hypertension Paternal Grandmother   . Hyperlipidemia Paternal Grandmother    History  Substance Use Topics  . Smoking status: Never Smoker   . Smokeless tobacco: Never Used  . Alcohol Use: No     Comment: none   OB History   Grav Para Term Preterm Abortions TAB SAB Ect Mult Living   0              Review of Systems  Constitutional: Negative for fever and chills.  Gastrointestinal: Positive for nausea, vomiting and abdominal pain. Negative for diarrhea, constipation, blood in stool and anal bleeding.  All other systems reviewed and are negative.     Allergies  Oxycontin; Penicillins; and Prednisone  Home Medications   Prior to Admission medications   Medication Sig Start Date End Date Taking? Authorizing Provider  budesonide (ENTOCORT EC) 3 MG 24 hr capsule Take 3 capsules (9 mg total) by mouth every morning. 09/10/13  Yes Sable Feil, MD  dicyclomine (BENTYL) 10 MG capsule Take 10 mg by mouth daily.   Yes Historical Provider, MD  esomeprazole (NEXIUM) 40 MG capsule Take 40 mg  by mouth daily at 12 noon.   Yes Historical Provider, MD  potassium chloride (KLOR-CON 10) 10 MEQ tablet Take 1 tablet (10 mEq total) by mouth 2 (two) times daily. 03/05/14  Yes Laurey Morale, MD   BP 114/56  Pulse 63  Temp(Src) 97.9 F (36.6 C) (Oral)  Resp 17  SpO2 100%  LMP 03/15/2014 Physical Exam  Nursing note and vitals reviewed. Constitutional: She is oriented to person, place, and time. She appears well-developed and well-nourished. No  distress.  HENT:  Head: Normocephalic and atraumatic.  Right Ear: External ear normal.  Left Ear: External ear normal.  Nose: Nose normal.  Mouth/Throat: Oropharynx is clear and moist. No oropharyngeal exudate.  Eyes: Conjunctivae are normal.  Neck: Normal range of motion. Neck supple.  Cardiovascular: Normal rate, regular rhythm and normal heart sounds.   Pulmonary/Chest: Effort normal and breath sounds normal. No respiratory distress.  Abdominal: Soft. Bowel sounds are normal. There is generalized tenderness. There is no rigidity, no rebound, no guarding and no CVA tenderness.  Musculoskeletal: Normal range of motion.  Neurological: She is alert and oriented to person, place, and time.  Skin: Skin is warm and dry. She is not diaphoretic.  Psychiatric: Her mood appears anxious.    ED Course  Procedures (including critical care time) Medications  sodium chloride 0.9 % bolus 1,000 mL (1,000 mLs Intravenous New Bag/Given 04/06/14 1442)  morphine 4 MG/ML injection 4 mg (4 mg Intravenous Given 04/06/14 1442)  ondansetron (ZOFRAN) injection 4 mg (4 mg Intravenous Given 04/06/14 1442)  iohexol (OMNIPAQUE) 300 MG/ML solution 50 mL (50 mLs Oral Contrast Given 04/06/14 1521)  morphine 4 MG/ML injection 4 mg (4 mg Intravenous Given 04/06/14 1605)    Labs Review Labs Reviewed  COMPREHENSIVE METABOLIC PANEL - Abnormal; Notable for the following:    Potassium 3.5 (*)    Glucose, Bld 109 (*)    All other components within normal limits  URINALYSIS, ROUTINE W REFLEX MICROSCOPIC - Abnormal; Notable for the following:    pH 8.5 (*)    Ketones, ur 15 (*)    Protein, ur 30 (*)    All other components within normal limits  URINE MICROSCOPIC-ADD ON - Abnormal; Notable for the following:    Bacteria, UA FEW (*)    All other components within normal limits  CBC WITH DIFFERENTIAL  LIPASE, BLOOD  PREGNANCY, URINE    Imaging Review No results found.   EKG Interpretation None      MDM    Final diagnoses:  None    Filed Vitals:   04/06/14 1538  BP: 114/56  Pulse: 63  Temp:   Resp: 17   Afebrile, NAD, non-toxic appearing, AAOx4.   Patient presenting with generalized abdominal cramping pain, consistent with previous Crohn's flare ups with associated nausea and vomiting. Abdomen is soft, diffusely tender, without rebound, guarding or rigidity. Bowel sounds normal. IVF, pain medication, and anti-emetics given. CBC, CMP, lipase, UA, pregnancy test obtained and reviewed. Plan to CT scan abdomen. Signed out to [employer], PA-C. Plan to discharge home as long as CT scan is unremarkable. Patient and family are agreeable to this plan.    Harlow Mares, PA-C 04/06/14 [id number]

## 2014-04-06 NOTE — ED Provider Notes (Signed)
Transfer of care from [facility], PA-C at change in shift.   Plan: CT abdomen and pelvis with contrast, fluids, re-assess. Anticipate discharge.   Julie Ward is a 18 year old female who is recently diagnosed with Crohn's disease in October of 2014 being followed by Tiro, PA-C. Patient reported that she's been having ongoing abdominal pain for the past week with increase in intensity today. Reported that she's had nausea and vomiting at least 7 episodes today-NB/NB. Denied melena, hematochezia, diarrhea, mucus stools, flatulence. PCP Dr. Sarajane Jews Gastroenterologist Marnee Spring, PA-C  Results for orders placed during the hospital encounter of 04/06/14  CBC WITH DIFFERENTIAL      Result Value Ref Range   WBC 4.7  4.0 - 10.5 K/uL   RBC 4.40  3.87 - 5.11 MIL/uL   Hemoglobin 13.2  12.0 - 15.0 g/dL   HCT 38.6  36.0 - 46.0 %   MCV 87.7  78.0 - 100.0 fL   MCH 30.0  26.0 - 34.0 pg   MCHC 34.2  30.0 - 36.0 g/dL   RDW 12.2  11.5 - 15.5 %   Platelets 260  150 - 400 K/uL   Neutrophils Relative % 71  43 - 77 %   Neutro Abs 3.3  1.7 - 7.7 K/uL   Lymphocytes Relative 21  12 - 46 %   Lymphs Abs 1.0  0.7 - 4.0 K/uL   Monocytes Relative 8  3 - 12 %   Monocytes Absolute 0.4  0.1 - 1.0 K/uL   Eosinophils Relative 0  0 - 5 %   Eosinophils Absolute 0.0  0.0 - 0.7 K/uL   Basophils Relative 0  0 - 1 %   Basophils Absolute 0.0  0.0 - 0.1 K/uL  COMPREHENSIVE METABOLIC PANEL      Result Value Ref Range   Sodium 139  137 - 147 mEq/L   Potassium 3.5 (*) 3.7 - 5.3 mEq/L   Chloride 102  96 - 112 mEq/L   CO2 20  19 - 32 mEq/L   Glucose, Bld 109 (*) 70 - 99 mg/dL   BUN 8  6 - 23 mg/dL   Creatinine, Ser 0.71  0.50 - 1.10 mg/dL   Calcium 9.5  8.4 - 10.5 mg/dL   Total Protein 7.7  6.0 - 8.3 g/dL   Albumin 3.8  3.5 - 5.2 g/dL   AST 12  0 - 37 U/L   ALT 7  0 - 35 U/L   Alkaline Phosphatase 61  39 - 117 U/L   Total Bilirubin 0.6  0.3 - 1.2 mg/dL   GFR calc non Af Amer  >90  >90 mL/min   GFR calc Af Amer >90  >90 mL/min  LIPASE, BLOOD      Result Value Ref Range   Lipase 24  11 - 59 U/L  URINALYSIS, ROUTINE W REFLEX MICROSCOPIC      Result Value Ref Range   Color, Urine YELLOW  YELLOW   APPearance CLEAR  CLEAR   Specific Gravity, Urine 1.020  1.005 - 1.030   pH 8.5 (*) 5.0 - 8.0   Glucose, UA NEGATIVE  NEGATIVE mg/dL   Hgb urine dipstick NEGATIVE  NEGATIVE   Bilirubin Urine NEGATIVE  NEGATIVE   Ketones, ur 15 (*) NEGATIVE mg/dL   Protein, ur 30 (*) NEGATIVE mg/dL   Urobilinogen, UA 1.0  0.0 - 1.0 mg/dL   Nitrite NEGATIVE  NEGATIVE   Leukocytes, UA NEGATIVE  NEGATIVE  PREGNANCY,  URINE      Result Value Ref Range   Preg Test, Ur NEGATIVE  NEGATIVE  URINE MICROSCOPIC-ADD ON      Result Value Ref Range   Squamous Epithelial / LPF RARE  RARE   WBC, UA 0-2  <3 WBC/hpf   Bacteria, UA FEW (*) RARE   This provider reviewed the patient's chart in Care Everywhere. Patient being followed by gastroenterology in [place], [place]. Patient has been placed on Bentyl, again denied, potassium and Nexium. Patient has history of cholecystectomy and appendectomy. Patient had colonoscopy performed October 2014 that identified ileitis.  5:06 PM Patient seen and re-assessed by this provider. Patient sitting comfortably in bed upright. Heart rate and rhythm normal. Lungs clear to auscultation to upper and lower lobes bilaterally-good lung expansion with negative stridor-patient stable to speak in full sentences without difficulty. Cap refill less than 3 seconds. Negative abdominal distention noted. Bowel sounds normal active in all 4 quadrants. Abdomen soft upon palpation. Mild discomfort upon palpation to the left lower and right lower quadrants.   CBC negative elevated white blood cell count-negative left shift or leukocytosis noted. CMP noted mildly low potassium at 3.5-patient currently taking potassium by mouth as an outpatient. Kidney and liver  functioning well. Lipase negative elevation. Urine pregnancy negative. Urinalysis negative for hemoglobin, nitrites or leukocytes-negative findings of infection. CT abdomen and pelvis with contrast noted trace amount of free pelvic fluid which may be physiological-no other acute abnormalities identified, no bowel abnormalities noted. Negative perforation the bowels noted.  6:13 PM Patient able to tolerate fluids PO. Discussed labs and imaging in great detail with patient and mother at bedside. Patient to be discharged. Patient did not want last dose of pain medications before she leaves - patient would like to leave.   Medications  sodium chloride 0.9 % bolus 1,000 mL (0 mLs Intravenous Stopped 04/06/14 1800)  morphine 4 MG/ML injection 4 mg (4 mg Intravenous Given 04/06/14 1442)  ondansetron (ZOFRAN) injection 4 mg (4 mg Intravenous Given 04/06/14 1442)  iohexol (OMNIPAQUE) 300 MG/ML solution 50 mL (50 mLs Oral Contrast Given 04/06/14 1521)  morphine 4 MG/ML injection 4 mg (4 mg Intravenous Given 04/06/14 1605)  iohexol (OMNIPAQUE) 300 MG/ML solution 100 mL (100 mLs Intravenous Contrast Given 04/06/14 1629)   Filed Vitals:   04/06/14 1302 04/06/14 1538 04/06/14 1757  BP: 119/60 114/56 130/60  Pulse: 78 63 90  Temp: 97.9 F (36.6 C)  98.2 F (36.8 C)  TempSrc: Oral  Oral  Resp: 21 17 18   SpO2: 100% 100% 100%   Diagnoses that have been ruled out:  None  Diagnoses that are still under consideration:  None  Final diagnoses:  Crohn's disease of colon  Abdominal pain   Doubt appendicitis. Doubt pancreatitis. Doubt acute abdominal processes. Doubt ectopic pregnancy. Doubt TOA. Suspicion to be Crohn's flare-up. Negative elevated WBC, negative left shift or leukocytosis noted. Negative findings of abscess or perforation noted on CT scan of abdomen and pelvis. Patient able to tolerate PO without episodes of emesis. Patient seen and assessed by attending physician, Dr. Hampton Abbot who agreed and  cleared patient for discharge. Patient stable, afebrile. Patient not septic appearing. Discharged patient. Discussed with patient proper diet. Discussed with patient to continue medications at home. Discussed with patient to call GI physician on Tuesday to be re-assessed this week. Discussed with patient to closely monitor symptoms and if symptoms are to worsen or change to report back to the ED - strict return  instructions given.  Patient agreed to plan of care, understood, all questions answered.   Jamse Mead, PA-C 04/07/14 [phone]

## 2014-04-06 NOTE — ED Notes (Signed)
Patient is unable to provide a urine sample at this time, will try again in 30 minutes.

## 2014-04-06 NOTE — ED Notes (Signed)
She c/o generalized abd. Pain x 5 days; has been vomiting occasionally for two days; and today is "real weak".  She and her mother recognize this as Crohn's flare.

## 2014-04-06 NOTE — Discharge Instructions (Signed)
Please call your doctor for a followup appointment within 24-48 hours. When you talk to your doctor please let them know that you were seen in the emergency department and have them acquire all of your records so that they can discuss the findings with you and formulate a treatment plan to fully care for your new and ongoing problems. Please call and set-up an appointment with your primary care provider to be re-assessed Please call Gastroenterologist first thing Tuesday morning to be re-assessed this week Please rest and stay hydrated - please drink plenty of fluids Please avoid any physical or strenuous activities Please stick with a clear liquid diet - please avoid foods high in fat, grease, acidity Please continue to monitor symptoms closely and if symptoms are to worsen or change (fever greater than 101, chills, sweating, nausea, vomiting, diarrhea, chest pain, shortness of breath, difficulty breathing, blood in the stools, black tarry stools, mucus in the stools, inability to keep food or fluids down, worsening or changes to abdominal pain, dizziness, fainting) please report back to the ED immediately   Crohn's Disease Crohn's disease is a long-term (chronic) soreness and redness (inflammation) of the intestines (bowel). It can affect any portion of the digestive tract, from the mouth to the anus. It can also cause problems outside the digestive tract. Crohn's disease is closely related to a disease called ulcerative colitis (together, these two diseases are called inflammatory bowel disease).  CAUSES  The cause of Crohn's disease is not known. One Link Snuffer is that, in an easily affected person, the immune system is triggered to attack the body's own digestive tissue. Crohn's disease runs in families. It seems to be more common in certain geographic areas and amongst certain races. There are no clear-cut dietary causes.  SYMPTOMS  Crohn's disease can cause many different symptoms since it can affect  many different parts of the body. Symptoms include:  Fatigue.  Weight loss.  Chronic diarrhea, sometime bloody.  Abdominal pain and cramps.  Fever.  Ulcers or canker sores in the mouth or rectum.  Anemia (low red blood cells).  Arthritis, skin problems, and eye problems may occur. Complications of Crohn's disease can include:  Series of holes (perforation) of the bowel.  Portions of the intestines sticking to each other (adhesions).  Obstruction of the bowel.  Fistula formation, typically in the rectal area but also in other areas. A fistula is an opening between the bowels and the outside, or between the bowels and another organ.  A painful crack in the mucous membrane of the anus (rectal fissure). DIAGNOSIS  Your caregiver may suspect Crohn's disease based on your symptoms and an exam. Blood tests may confirm that there is a problem. You may be asked to submit a stool specimen for examination. X-rays and CT scans may be necessary. Ultimately, the diagnosis is usually made after a procedure that uses a flexible tube that is inserted via your mouth or your anus. This is done under sedation and is called either an upper endoscopy or colonoscopy. With these tests, the specialist can take tiny tissue samples and remove them from the inside of the bowel (biopsy). Examination of this biopsy tissue under a microscope can reveal Crohn's disease as the cause of your symptoms. Due to the many different forms that Crohn's disease can take, symptoms may be present for several years before a diagnosis is made. TREATMENT  Medications are often used to decrease inflammation and control the immune system. These include medicines related to aspirin,  steroid medications, and newer and stronger medications to slow down the immune system. Some medications may be used as suppositories or enemas. A number of other medications are used or have been studied. Your caregiver will make specific  recommendations. HOME CARE INSTRUCTIONS   Symptoms such as diarrhea can be controlled with medications. Avoid foods that have a laxative effect such as fresh fruit, vegetables and dairy products. During flare ups, you can rest your bowel by refraining from solid foods. Drink clear liquids frequently during the day (electrolyte or re-hydrating fluids are best. Your caregiver can help you with suggestions). Drink often to prevent loss of body fluids (dehydration). When diarrhea has cleared, eat small meals and more frequently. Avoid food additives and stimulants such as caffeine (coffee, tea, or chocolate). Enzyme supplements may help if you develop intolerance to a sugar in dairy products (lactose). Ask your caregiver or dietitian about specific dietary instructions.  Try to maintain a positive attitude. Learn relaxation techniques such as self hypnosis, mental imaging, and muscle relaxation.  If possible, avoid stresses which can aggravate your condition.  Exercise regularly.  Follow your diet.  Always get plenty of rest. SEEK MEDICAL CARE IF:   Your symptoms fail to improve after a week or two of new treatment.  You experience continued weight loss.  You have ongoing cramps or loose bowels.  You develop a new skin rash, skin sores, or eye problems. SEEK IMMEDIATE MEDICAL CARE IF:   You have worsening of your symptoms or develop new symptoms.  You have a fever.  You develop bloody diarrhea.  You develop severe abdominal pain. MAKE SURE YOU:   Understand these instructions.  Will watch your condition.  Will get help right away if you are not doing well or get worse. Document Released: 08/11/2005 Document Revised: 02/26/2013 Document Reviewed: 07/10/2007 [facility] Patient Information 2014 [place], [place].  Diet The clear liquid diet consists of foods that are liquid or will become liquid at room temperature. Examples of foods allowed on a clear liquid diet include fruit juice,  broth or bouillon, gelatin, or frozen ice pops. You should be able to see through the liquid. The purpose of this diet is to provide the necessary fluids, electrolytes (such as sodium and potassium), and energy to keep the body functioning during times when you are not able to consume a regular diet. A clear liquid diet should not be continued for long periods of time, as it is not nutritionally adequate.  A CLEAR LIQUID DIET MAY BE NEEDED:  When a sudden-onset (acute) condition occurs before or after surgery.   As the first step in oral feeding.   For fluid and electrolyte replacement in diarrheal diseases.   As a diet before certain medical tests are performed.  ADEQUACY The clear liquid diet is adequate only in ascorbic acid, according to the Recommended Dietary Allowances of the [employer].  CHOOSING FOODS Breads and Starches  Allowed: None are allowed.   Avoid: All are to be avoided.  Vegetables  Allowed: Strained vegetable juices.   Avoid: Any others.  Fruit  Allowed: Strained fruit juices and fruit drinks. Include 1 serving of citrus or vitamin C-enriched fruit juice daily.   Avoid: Any others.  Meat and Meat Substitutes  Allowed: None are allowed.   Avoid: All are to be avoided.  Milk Products  Allowed: None are allowed.   Avoid: All are to be avoided.  Soups and Combination Foods  Allowed: Clear bouillon, broth, or strained broth-based  soups.   Avoid: Any others.  Desserts and Sweets  Allowed: Sugar, honey. High-protein gelatin. Flavored gelatin, ices, or frozen ice pops that do not contain milk.   Avoid: Any others.  Fats and Oils  Allowed: None are allowed.   Avoid: All are to be avoided.  Beverages  Allowed: Cereal beverages, coffee (regular or decaffeinated), tea, or soda at the discretion of your health care provider.   Avoid: Any others.  Condiments  Allowed: Salt.   Avoid:  Any others, including pepper.  Supplements  Allowed: Liquid nutrition beverages that you can see through.   Avoid: Any others that contain lactose or fiber. SAMPLE MEAL PLAN Breakfast  4 oz (120 mL) strained orange juice.   to 1 cup (120 to 240 mL) gelatin (plain or fortified).  1 cup (240 mL) beverage (coffee or tea).  Sugar, if desired. Midmorning Snack   cup (120 mL) gelatin (plain or fortified). Lunch  1 cup (240 mL) broth or consomm.  4 oz (120 mL) strained grapefruit juice.   cup (120 mL) gelatin (plain or fortified).  1 cup (240 mL) beverage (coffee or tea).  Sugar, if desired. Midafternoon Snack   cup (120 mL) fruit ice.   cup (120 mL) strained fruit juice. Dinner  1 cup (240 mL) broth or consomm.   cup (120 mL) cranberry juice.   cup (120 mL) flavored gelatin (plain or fortified).  1 cup (240 mL) beverage (coffee or tea).  Sugar, if desired. Evening Snack  4 oz (120 mL) strained apple juice (vitamin C-fortified).   cup (120 mL) flavored gelatin (plain or fortified). MAKE SURE YOU:  Understand these instructions.  Will watch your child's condition.  Will get help right away if your child is not doing well or gets worse. Document Released: 11/01/2005 Document Revised: 07/04/2013 Document Reviewed: 04/03/2013 [facility] Patient Information 2014 [place].

## 2014-04-06 NOTE — ED Notes (Signed)
Stuck pt x1 unsuccessful. Pt would only like to be stuck in hands. Charge nurse aware.

## 2014-04-07 NOTE — ED Provider Notes (Signed)
Medical screening examination/treatment/procedure(s) were conducted as a shared visit with non-physician practitioner(s) and myself.  I personally evaluated the patient during the encounter.   EKG Interpretation None      I interviewed and examined the patient. Lungs are CTAB. Cardiac exam wnl. Abdomen soft, mild diffuse non-spec ttp. Pt appears well. Labs/imaging non-contrib. Pain controlled. Will rec f/u w/ her GI doctor.   Blanchard Kelch, MD 04/07/14 1139

## 2014-04-08 NOTE — ED Provider Notes (Signed)
Medical screening examination/treatment/procedure(s) were performed by non-physician practitioner and as supervising physician I was immediately available for consultation/collaboration.   EKG Interpretation None       Richarda Blade, MD 04/08/14 1018

## 2014-04-21 ENCOUNTER — Emergency Department (HOSPITAL_COMMUNITY)
Admission: EM | Admit: 2014-04-21 | Discharge: 2014-04-21 | Disposition: A | Discharge status: Home / Self Care | Payer: BC Managed Care – PPO | Attending: Emergency Medicine | Admitting: Emergency Medicine

## 2014-04-21 ENCOUNTER — Encounter (HOSPITAL_COMMUNITY): Payer: Self-pay | Admitting: Emergency Medicine

## 2014-04-21 ENCOUNTER — Emergency Department (HOSPITAL_COMMUNITY): Payer: BC Managed Care – PPO

## 2014-04-21 DIAGNOSIS — Z880 Allergy status to penicillin: Secondary | ICD-10-CM | POA: Insufficient documentation

## 2014-04-21 DIAGNOSIS — Z79899 Other long term (current) drug therapy: Secondary | ICD-10-CM | POA: Insufficient documentation

## 2014-04-21 DIAGNOSIS — K5090 Crohn's disease, unspecified, without complications: Secondary | ICD-10-CM

## 2014-04-21 DIAGNOSIS — R6883 Chills (without fever): Secondary | ICD-10-CM | POA: Insufficient documentation

## 2014-04-21 DIAGNOSIS — K219 Gastro-esophageal reflux disease without esophagitis: Secondary | ICD-10-CM | POA: Insufficient documentation

## 2014-04-21 DIAGNOSIS — Z888 Allergy status to other drugs, medicaments and biological substances status: Secondary | ICD-10-CM | POA: Insufficient documentation

## 2014-04-21 DIAGNOSIS — R5383 Other fatigue: Secondary | ICD-10-CM

## 2014-04-21 DIAGNOSIS — R5381 Other malaise: Secondary | ICD-10-CM | POA: Insufficient documentation

## 2014-04-21 DIAGNOSIS — R112 Nausea with vomiting, unspecified: Secondary | ICD-10-CM | POA: Insufficient documentation

## 2014-04-21 DIAGNOSIS — K5010 Crohn's disease of large intestine without complications: Secondary | ICD-10-CM | POA: Insufficient documentation

## 2014-04-21 DIAGNOSIS — N946 Dysmenorrhea, unspecified: Secondary | ICD-10-CM | POA: Insufficient documentation

## 2014-04-21 DIAGNOSIS — R0789 Other chest pain: Secondary | ICD-10-CM | POA: Insufficient documentation

## 2014-04-21 DIAGNOSIS — R1084 Generalized abdominal pain: Secondary | ICD-10-CM

## 2014-04-21 DIAGNOSIS — R42 Dizziness and giddiness: Secondary | ICD-10-CM | POA: Insufficient documentation

## 2014-04-21 DIAGNOSIS — R197 Diarrhea, unspecified: Secondary | ICD-10-CM | POA: Insufficient documentation

## 2014-04-21 LAB — COMPREHENSIVE METABOLIC PANEL
ALT: 8 U/L (ref 0–35)
AST: 15 U/L (ref 0–37)
Albumin: 3.8 g/dL (ref 3.5–5.2)
Alkaline Phosphatase: 60 U/L (ref 39–117)
BUN: 7 mg/dL (ref 6–23)
CALCIUM: 9.4 mg/dL (ref 8.4–10.5)
CO2: 23 meq/L (ref 19–32)
Chloride: 101 mEq/L (ref 96–112)
Creatinine, Ser: 0.64 mg/dL (ref 0.50–1.10)
GLUCOSE: 81 mg/dL (ref 70–99)
Potassium: 3.1 mEq/L — ABNORMAL LOW (ref 3.7–5.3)
Sodium: 140 mEq/L (ref 137–147)
Total Bilirubin: 0.7 mg/dL (ref 0.3–1.2)
Total Protein: 8.2 g/dL (ref 6.0–8.3)

## 2014-04-21 LAB — CBC WITH DIFFERENTIAL/PLATELET
Basophils Absolute: 0 10*3/uL (ref 0.0–0.1)
Basophils Relative: 1 % (ref 0–1)
Eosinophils Absolute: 0 10*3/uL (ref 0.0–0.7)
Eosinophils Relative: 0 % (ref 0–5)
HEMATOCRIT: 37 % (ref 36.0–46.0)
Hemoglobin: 12.9 g/dL (ref 12.0–15.0)
LYMPHS PCT: 29 % (ref 12–46)
Lymphs Abs: 1.4 10*3/uL (ref 0.7–4.0)
MCH: 30.3 pg (ref 26.0–34.0)
MCHC: 34.9 g/dL (ref 30.0–36.0)
MCV: 86.9 fL (ref 78.0–100.0)
MONO ABS: 1 10*3/uL (ref 0.1–1.0)
MONOS PCT: 19 % — AB (ref 3–12)
Neutro Abs: 2.5 10*3/uL (ref 1.7–7.7)
Neutrophils Relative %: 51 % (ref 43–77)
Platelets: 233 10*3/uL (ref 150–400)
RBC: 4.26 MIL/uL (ref 3.87–5.11)
RDW: 12.1 % (ref 11.5–15.5)
WBC: 4.9 10*3/uL (ref 4.0–10.5)

## 2014-04-21 LAB — URINALYSIS, ROUTINE W REFLEX MICROSCOPIC
Glucose, UA: NEGATIVE mg/dL
Hgb urine dipstick: NEGATIVE
LEUKOCYTES UA: NEGATIVE
Nitrite: NEGATIVE
PROTEIN: NEGATIVE mg/dL
Specific Gravity, Urine: 1.025 (ref 1.005–1.030)
Urobilinogen, UA: 1 mg/dL (ref 0.0–1.0)
pH: 6 (ref 5.0–8.0)

## 2014-04-21 LAB — POC OCCULT BLOOD, ED: Fecal Occult Bld: NEGATIVE

## 2014-04-21 LAB — I-STAT TROPONIN, ED: Troponin i, poc: 0 ng/mL (ref 0.00–0.08)

## 2014-04-21 LAB — POC URINE PREG, ED: Preg Test, Ur: NEGATIVE

## 2014-04-21 LAB — LIPASE, BLOOD: LIPASE: 27 U/L (ref 11–59)

## 2014-04-21 MED ORDER — ONDANSETRON HCL 4 MG/2ML IJ SOLN
4.0000 mg | Freq: Once | INTRAMUSCULAR | Status: AC
  Administered 2014-04-21: 4 mg via INTRAVENOUS
  Filled 2014-04-21: qty 2

## 2014-04-21 MED ORDER — PROMETHAZINE HCL 25 MG RE SUPP
25.0000 mg | Freq: Four times a day (QID) | RECTAL | Status: DC | PRN
  Filled 2014-04-21: qty 1

## 2014-04-21 MED ORDER — SODIUM CHLORIDE 0.9 % IV BOLUS (SEPSIS)
1000.0000 mL | Freq: Once | INTRAVENOUS | Status: AC
  Administered 2014-04-21: 1000 mL via INTRAVENOUS

## 2014-04-21 MED ORDER — BUDESONIDE 3 MG PO CP24
3.0000 mg | ORAL_CAPSULE | Freq: Once | ORAL | Status: AC
  Administered 2014-04-21: 3 mg via ORAL
  Filled 2014-04-21: qty 1

## 2014-04-21 MED ORDER — MORPHINE SULFATE 4 MG/ML IJ SOLN
4.0000 mg | Freq: Once | INTRAMUSCULAR | Status: AC
  Administered 2014-04-21: 4 mg via INTRAVENOUS
  Filled 2014-04-21: qty 1

## 2014-04-21 MED ORDER — SODIUM CHLORIDE 0.9 % IV BOLUS (SEPSIS): 1000.0000 mL | Freq: Once | INTRAVENOUS | Status: DC

## 2014-04-21 MED ORDER — PROMETHAZINE HCL 25 MG RE SUPP: 25.0000 mg | Freq: Four times a day (QID) | RECTAL | Status: DC | PRN

## 2014-04-21 NOTE — ED Notes (Signed)
Pt ambulated to restroom. 

## 2014-04-21 NOTE — Discharge Instructions (Signed)
1. Medications: Phenergan  Suppository, usual home medications 2. Treatment: rest, drink plenty of fluids, try fruit smoothie, plenty of liquids, soup 3. Follow Up: Please followup with Dr. Blanca Friend tomorrow for discussion of your diagnoses and further evaluation after today's visit;    Crohn's Disease Crohn's disease is a long-term (chronic) soreness and redness (inflammation) of the intestines (bowel). It can affect any portion of the digestive tract, from the mouth to the anus. It can also cause problems outside the digestive tract. Crohn's disease is closely related to a disease called ulcerative colitis (together, these two diseases are called inflammatory bowel disease).  CAUSES  The cause of Crohn's disease is not known. One Link Snuffer is that, in an easily affected person, the immune system is triggered to attack the body's own digestive tissue. Crohn's disease runs in families. It seems to be more common in certain geographic areas and amongst certain races. There are no clear-cut dietary causes.  SYMPTOMS  Crohn's disease can cause many different symptoms since it can affect many different parts of the body. Symptoms include:  Fatigue.  Weight loss.  Chronic diarrhea, sometime bloody.  Abdominal pain and cramps.  Fever.  Ulcers or canker sores in the mouth or rectum.  Anemia (low red blood cells).  Arthritis, skin problems, and eye problems may occur. Complications of Crohn's disease can include:  Series of holes (perforation) of the bowel.  Portions of the intestines sticking to each other (adhesions).  Obstruction of the bowel.  Fistula formation, typically in the rectal area but also in other areas. A fistula is an opening between the bowels and the outside, or between the bowels and another organ.  A painful crack in the mucous membrane of the anus (rectal fissure). DIAGNOSIS  Your caregiver may suspect Crohn's disease based on your symptoms and an exam. Blood  tests may confirm that there is a problem. You may be asked to submit a stool specimen for examination. X-rays and CT scans may be necessary. Ultimately, the diagnosis is usually made after a procedure that uses a flexible tube that is inserted via your mouth or your anus. This is done under sedation and is called either an upper endoscopy or colonoscopy. With these tests, the specialist can take tiny tissue samples and remove them from the inside of the bowel (biopsy). Examination of this biopsy tissue under a microscope can reveal Crohn's disease as the cause of your symptoms. Due to the many different forms that Crohn's disease can take, symptoms may be present for several years before a diagnosis is made. TREATMENT  Medications are often used to decrease inflammation and control the immune system. These include medicines related to aspirin, steroid medications, and newer and stronger medications to slow down the immune system. Some medications may be used as suppositories or enemas. A number of other medications are used or have been studied. Your caregiver will make specific recommendations. HOME CARE INSTRUCTIONS   Symptoms such as diarrhea can be controlled with medications. Avoid foods that have a laxative effect such as fresh fruit, vegetables and dairy products. During flare ups, you can rest your bowel by refraining from solid foods. Drink clear liquids frequently during the day (electrolyte or re-hydrating fluids are best. Your caregiver can help you with suggestions). Drink often to prevent loss of body fluids (dehydration). When diarrhea has cleared, eat small meals and more frequently. Avoid food additives and stimulants such as caffeine (coffee, tea, or chocolate). Enzyme supplements may help if you develop  intolerance to a sugar in dairy products (lactose). Ask your caregiver or dietitian about specific dietary instructions.  Try to maintain a positive attitude. Learn relaxation techniques  such as self hypnosis, mental imaging, and muscle relaxation.  If possible, avoid stresses which can aggravate your condition.  Exercise regularly.  Follow your diet.  Always get plenty of rest. SEEK MEDICAL CARE IF:   Your symptoms fail to improve after a week or two of new treatment.  You experience continued weight loss.  You have ongoing cramps or loose bowels.  You develop a new skin rash, skin sores, or eye problems. SEEK IMMEDIATE MEDICAL CARE IF:   You have worsening of your symptoms or develop new symptoms.  You have a fever.  You develop bloody diarrhea.  You develop severe abdominal pain. MAKE SURE YOU:   Understand these instructions.  Will watch your condition.  Will get help right away if you are not doing well or get worse. Document Released: 08/11/2005 Document Revised: 02/26/2013 Document Reviewed: 07/10/2007 [facility] Patient Information 2014 [place], [place].

## 2014-04-21 NOTE — ED Provider Notes (Signed)
Care assumed from [place], PA-C  Julie Ward is a 18 y.o. female with HX of Crohn's disease and followed at [facility]. Pt with N/V/D x4-5 days with lethargy and weakness.  Pt with some increased depression since the Crohn'd Dx.  Chronic abd pain since Dx.  Soft abd with mild generalized abd pain; no peritonitis but voluntary guarding.  Tx with budesonide and has not had it in 3 days.  Pt was supposed to see GI on 04/15/14 but was cancelled by the office.  Plan: Fecal occult and plain films pending.  Last Ct in 04/06/14 and all previous have been unremarkable.  Fluids, pain control, PO challenge and GI f/u.      Face to face Exam:   General: Awake  HEENT: Atraumatic  Resp: Normal effort  Abd: Nondistended, voluntary guarding, mild generalized tenderness, no peritoneal signs Neuro:No focal weakness  Lymph: No adenopathy  BP 114/63  Pulse 87  Temp(Src) 98.2 F (36.8 C) (Oral)  Resp 16  SpO2 100%  LMP 03/15/2014  6:22 PM Patient without emesis here in the department. She reports she attempted to drink water but she became concerned that her pain would increase.    Patient will be given 3 mg of Entocort and she's been unable to take her medication today. She will also be given Phenergan suppository and by mouth trial will be repeated. Patient reports that her pain is controlled at this time.  I discussed the need for her to followup with her GI specialist and she and her mother agree.  7:50 PM Patient feeling better at this time. She is tolerated greater than 6 ounces of fluids here in the emergency department for greater than one hour. Significant improvement with Phenergan suppository. We'll discharge him with same. Patient has been given an official referral to doctors do it she is to followup tomorrow.  Abdomen remains soft. She has no guarding at this time. Improved tenderness and appears to have signs.  Patient is nontoxic, nonseptic appearing, in no apparent distress.  Patient's pain  and other symptoms adequately managed in emergency department.  Fluid bolus given.  Labs, imaging and vitals reviewed.  Patient does not meet the SIRS or Sepsis criteria.  On repeat exam patient does not have a surgical abdomin and there are no peritoneal signs.  No indication of appendicitis, bowel obstruction, bowel perforation, cholecystitis, diverticulitis, PID or ectopic pregnancy.  Patient discharged home with symptomatic treatment.  I have also discussed reasons to return immediately to the ER.  Patient expresses understanding and agrees with plan.  It has been determined that no acute conditions requiring further emergency intervention are present at this time. The patient/guardian have been advised of the diagnosis and plan. We have discussed signs and symptoms that warrant return to the ED, such as changes or worsening in symptoms.   Vital signs are stable at discharge.   BP 114/63  Pulse 87  Temp(Src) 98.2 F (36.8 C) (Oral)  Resp 16  SpO2 100%  LMP 03/15/2014  Patient/guardian has voiced understanding and agreed to follow-up with the PCP or specialist.      Abigail Butts, PA-C 04/21/14 1952

## 2014-04-21 NOTE — ED Notes (Signed)
Pt presents to ed with c/o generalized abdominal pain, vomiting and diarrhea. Pt has hx of Crohn's disease but sts is unsure it this is flare up.

## 2014-04-21 NOTE — ED Provider Notes (Signed)
CSN: [id number]     Arrival date & time 04/21/14  1223 History   First MD Initiated Contact with Patient 04/21/14 1253     Chief Complaint  Patient presents with  . Abdominal Pain     (Consider location/radiation/quality/duration/timing/severity/associated sxs/prior Treatment) The history is provided by the patient. No language interpreter was used.  Julie Ward is a 18 year old female with PMHx of GERD and Crohn's disease recently diagnosed in October of 2015 presenting to the ED with abdominal pain that started approximately 4-5 days ago. Reported that the abdominal pain is generalized - reported that the discomfort is a cramping, heaviness, dull pain that is constant. Reported that she has been unable to keep any food or fluids down for the past couple of days. Reported that she has not been able to take her medications. Stated that she has been having countless episodes of nausea and vomiting. Stated that she has been experiencing loose stools with a bloodish tinge - reported that when she wipes she has dark red blood on the toilet paper. Reported that she has been feeling tired and fatigued and mother is becoming concerned. Stated that she continues to follow [facility] in [place], but stated that she was due to have an appointment with GI on 04/15/2014, but stated that the office canceled the appointment. Reported that she has been trying to get in contact with the provider, but has been unsuccessful. Denied mucus in her stools, fever, numbness, tingling, urinary issues, hematuria, shortness of breath, difficulty breathing, neck pain, neck stiffness, dizziness.  PCP Dr. Sarajane Jews  [facility]   Past Medical History  Diagnosis Date  . GERD (gastroesophageal reflux disease)   . Allergy   . Dysmenorrhea   . Abdominal pain, recurrent   . Appendicitis   . Crohn's disease of colon 10/14    sees Dr. Blanca Friend at [facility]    Past Surgical History  Procedure Laterality Date  .  Appendectomy  07-14-11    laparoscopic   . Esophagogastroduodenoscopy  01/28/2012    Procedure: ESOPHAGOGASTRODUODENOSCOPY (EGD);  Surgeon: Oletha Blend, MD;  Location: [place];  Service: Gastroenterology;  Laterality: N/A;  . Wisdom tooth extraction    . Cholecystectomy  02/11/2012    Procedure: LAPAROSCOPIC CHOLECYSTECTOMY WITH INTRAOPERATIVE CHOLANGIOGRAM;  Surgeon: Gwenyth Ober, MD;  Location: [place];  Service: General;  Laterality: N/A;   Family History  Problem Relation Age of Onset  . Cancer Paternal Aunt     ovarian, kidney  . Cancer Paternal Uncle     colon  . Colon cancer Paternal Uncle   . Cancer Maternal Grandmother     breast   . Hypertension Maternal Grandmother   . Irritable bowel syndrome Maternal Grandmother   . Heart disease Maternal Grandmother   . Hypertension Paternal Grandmother   . Hyperlipidemia Paternal Grandmother    History  Substance Use Topics  . Smoking status: Never Smoker   . Smokeless tobacco: Never Used  . Alcohol Use: No     Comment: none   OB History   Grav Para Term Preterm Abortions TAB SAB Ect Mult Living   0              Review of Systems  Constitutional: Positive for chills and fatigue. Negative for fever.  Respiratory: Negative for cough, chest tightness and shortness of breath.   Cardiovascular: Positive for chest pain.  Gastrointestinal: Positive for nausea, vomiting, abdominal pain, diarrhea and blood in stool. Negative for  constipation and anal bleeding.  Genitourinary: Negative for decreased urine volume.  Musculoskeletal: Negative for back pain and neck pain.  Neurological: Positive for light-headedness. Negative for dizziness.      Allergies  Oxycontin; Penicillins; and Prednisone  Home Medications   Prior to Admission medications   Medication Sig Start Date End Date Taking? Authorizing Provider  budesonide (ENTOCORT EC) 3 MG 24 hr capsule Take 3 capsules (9 mg total) by mouth every morning. 09/10/13  Yes Sable Feil, MD  dicyclomine (BENTYL) 10 MG capsule Take 10 mg by mouth daily.   Yes Historical Provider, MD  esomeprazole (NEXIUM) 40 MG capsule Take 40 mg by mouth daily at 12 noon.   Yes Historical Provider, MD  potassium chloride (KLOR-CON 10) 10 MEQ tablet Take 1 tablet (10 mEq total) by mouth 2 (two) times daily. 03/05/14  Yes Laurey Morale, MD  ondansetron (ZOFRAN) 4 MG tablet Take 1 tablet (4 mg total) by mouth every 6 (six) hours. 04/06/14   Maleeya Peterkin, PA-C   BP 114/63  Pulse 87  Temp(Src) 98.2 F (36.8 C) (Oral)  Resp 16  SpO2 100%  LMP 03/15/2014 Physical Exam  Nursing note and vitals reviewed. Constitutional: She is oriented to person, place, and time. She appears well-developed and well-nourished. No distress.  HENT:  Head: Normocephalic and atraumatic.  Mouth/Throat: Oropharynx is clear and moist. No oropharyngeal exudate.  Eyes: Conjunctivae and EOM are normal. Pupils are equal, round, and reactive to light. Right eye exhibits no discharge. Left eye exhibits no discharge.  Neck: Normal range of motion. Neck supple. No tracheal deviation present.  Cardiovascular: Normal rate, regular rhythm and normal heart sounds.  Exam reveals no friction rub.   No murmur heard. Pulses:      Radial pulses are 2+ on the right side, and 2+ on the left side.       Dorsalis pedis pulses are 2+ on the right side, and 2+ on the left side.  Pulmonary/Chest: Effort normal and breath sounds normal. No respiratory distress. She has no wheezes. She has no rales.  Abdominal: Soft. Bowel sounds are normal. She exhibits no distension. There is tenderness. There is no rebound and no guarding.  Negative abdominal distension noted BS normoactive in all quadrants Abdomen soft upon palpation  Discomfort upon palpation to the right side of the abdomen  Genitourinary:  Rectal Exam: Negative swelling, erythema, inflammation, lesions, sores noted to the anus. Negative external hemorrhoids noted. Negative  active bleeding or drainage noted. Mildly raw skin secondary to diarrhea. Strong sphincter tone. Negative masses or polyps palpated. Negative blood on glove. Loose stools of a light brown color noted.   Musculoskeletal: Normal range of motion.  Lymphadenopathy:    She has no cervical adenopathy.  Neurological: She is alert and oriented to person, place, and time. No cranial nerve deficit. She exhibits normal muscle tone. Coordination normal.  Skin: Skin is warm and dry. No rash noted. She is not diaphoretic. No erythema.  Psychiatric: She has a normal mood and affect. Her behavior is normal. Thought content normal.    ED Course  Procedures (including critical care time)  Results for orders placed during the hospital encounter of 04/21/14  CBC WITH DIFFERENTIAL      Result Value Ref Range   WBC 4.9  4.0 - 10.5 K/uL   RBC 4.26  3.87 - 5.11 MIL/uL   Hemoglobin 12.9  12.0 - 15.0 g/dL   HCT 37.0  36.0 - 46.0 %  MCV 86.9  78.0 - 100.0 fL   MCH 30.3  26.0 - 34.0 pg   MCHC 34.9  30.0 - 36.0 g/dL   RDW 12.1  11.5 - 15.5 %   Platelets 233  150 - 400 K/uL   Neutrophils Relative % 51  43 - 77 %   Neutro Abs 2.5  1.7 - 7.7 K/uL   Lymphocytes Relative 29  12 - 46 %   Lymphs Abs 1.4  0.7 - 4.0 K/uL   Monocytes Relative 19 (*) 3 - 12 %   Monocytes Absolute 1.0  0.1 - 1.0 K/uL   Eosinophils Relative 0  0 - 5 %   Eosinophils Absolute 0.0  0.0 - 0.7 K/uL   Basophils Relative 1  0 - 1 %   Basophils Absolute 0.0  0.0 - 0.1 K/uL  COMPREHENSIVE METABOLIC PANEL      Result Value Ref Range   Sodium 140  137 - 147 mEq/L   Potassium 3.1 (*) 3.7 - 5.3 mEq/L   Chloride 101  96 - 112 mEq/L   CO2 23  19 - 32 mEq/L   Glucose, Bld 81  70 - 99 mg/dL   BUN 7  6 - 23 mg/dL   Creatinine, Ser 0.64  0.50 - 1.10 mg/dL   Calcium 9.4  8.4 - 10.5 mg/dL   Total Protein 8.2  6.0 - 8.3 g/dL   Albumin 3.8  3.5 - 5.2 g/dL   AST 15  0 - 37 U/L   ALT 8  0 - 35 U/L   Alkaline Phosphatase 60  39 - 117 U/L   Total  Bilirubin 0.7  0.3 - 1.2 mg/dL   GFR calc non Af Amer >90  >90 mL/min   GFR calc Af Amer >90  >90 mL/min  LIPASE, BLOOD      Result Value Ref Range   Lipase 27  11 - 59 U/L  URINALYSIS, ROUTINE W REFLEX MICROSCOPIC      Result Value Ref Range   Color, Urine AMBER (*) YELLOW   APPearance CLOUDY (*) CLEAR   Specific Gravity, Urine 1.025  1.005 - 1.030   pH 6.0  5.0 - 8.0   Glucose, UA NEGATIVE  NEGATIVE mg/dL   Hgb urine dipstick NEGATIVE  NEGATIVE   Bilirubin Urine SMALL (*) NEGATIVE   Ketones, ur >80 (*) NEGATIVE mg/dL   Protein, ur NEGATIVE  NEGATIVE mg/dL   Urobilinogen, UA 1.0  0.0 - 1.0 mg/dL   Nitrite NEGATIVE  NEGATIVE   Leukocytes, UA NEGATIVE  NEGATIVE  POC URINE PREG, ED      Result Value Ref Range   Preg Test, Ur NEGATIVE  NEGATIVE  I-STAT TROPOININ, ED      Result Value Ref Range   Troponin i, poc 0.00  0.00 - 0.08 ng/mL   Comment 3           POC OCCULT BLOOD, ED      Result Value Ref Range   Fecal Occult Bld NEGATIVE  NEGATIVE    Labs Review Labs Reviewed  CBC WITH DIFFERENTIAL - Abnormal; Notable for the following:    Monocytes Relative 19 (*)    All other components within normal limits  COMPREHENSIVE METABOLIC PANEL - Abnormal; Notable for the following:    Potassium 3.1 (*)    All other components within normal limits  URINALYSIS, ROUTINE W REFLEX MICROSCOPIC - Abnormal; Notable for the following:    Color, Urine AMBER (*)  APPearance CLOUDY (*)    Bilirubin Urine SMALL (*)    Ketones, ur >80 (*)    All other components within normal limits  STOOL CULTURE  LIPASE, BLOOD  POC URINE PREG, ED  I-STAT TROPOININ, ED  POC OCCULT BLOOD, ED    Imaging Review No results found.   EKG Interpretation None      Date: 04/21/2014  Rate: 69  Rhythm: normal sinus rhythm  QRS Axis: normal  Intervals: normal  ST/T Wave abnormalities: normal  Conduction Disutrbances:none  Narrative Interpretation:   Old EKG Reviewed: unchanged EKG analyzed and  reviewed by this provider and attending physician.    MDM   Final diagnoses:  None   Medications  sodium chloride 0.9 % bolus 1,000 mL (not administered)  morphine 4 MG/ML injection 4 mg (not administered)  ondansetron (ZOFRAN) injection 4 mg (not administered)  sodium chloride 0.9 % bolus 1,000 mL (not administered)  morphine 4 MG/ML injection 4 mg (4 mg Intravenous Given 04/21/14 1447)  ondansetron (ZOFRAN) injection 4 mg (4 mg Intravenous Given 04/21/14 1446)  sodium chloride 0.9 % bolus 1,000 mL (0 mLs Intravenous Stopped 04/21/14 1637)   Filed Vitals:   04/21/14 1241 04/21/14 1539  BP: 116/66 114/63  Pulse: 87   Temp: 98.2 F (36.8 C)   TempSrc: Oral   Resp: 20 16  SpO2: 100% 100%     This provider reviewed the patient's chart. Patient has been seen in the ED setting regarding similar complaints on 04/06/2014 with unremarkable CT abdomen and pelvis with contrast. CT scan of the abdomen and pelvis with contrast were performed in 09/2013, 10/2013, and 03/2014 with negative acute abnormalities noted.  EKG noted normal sinus rhythm with a heart rate of 69 bpm. Troponin negative elevation. CBC negative elevated white blood cell count-negative left shift or leukocytosis noted. CMP noted mildly low potassium of 3.1. Kidney and liver functioning well. Lipase negative elevation. Urinalysis noted cloudy appearance with small bilirubin. Urine pregnancy negative. Fecal occult negative.  Imaging pending. Doubt SBO. Doubt acute abdominal processes. Suspicion to be patient's Crohn's disease acting up. Discussed case with Abigail Butts, PA-C. Transfer of care to [facility], PA-C at change in shift.   Jamse Mead, PA-C 04/22/14 1146

## 2014-04-22 NOTE — ED Provider Notes (Signed)
Medical screening examination/treatment/procedure(s) were performed by non-physician practitioner and as supervising physician I was immediately available for consultation/collaboration.   EKG Interpretation None        Merryl Hacker, MD 04/22/14 1609

## 2014-04-22 NOTE — ED Provider Notes (Signed)
Medical screening examination/treatment/procedure(s) were performed by non-physician practitioner and as supervising physician I was immediately available for consultation/collaboration.  Leota Jacobsen, MD 04/22/14 [phone]

## 2014-04-24 ENCOUNTER — Telehealth: Payer: Self-pay | Admitting: Family Medicine

## 2014-04-24 NOTE — Telephone Encounter (Signed)
Julie Ward would like deb to return her call concerning a referral

## 2014-05-27 ENCOUNTER — Telehealth: Payer: Self-pay | Admitting: Family Medicine

## 2014-05-27 NOTE — Telephone Encounter (Signed)
Pt had a xray on her back today 05/27/14 . Pt Mom called and said Georga Hacking request pt to have labs done. Can a lab order be put in?

## 2014-05-27 NOTE — Telephone Encounter (Signed)
Per Dr. Sarajane Jews, the doctor that is requesting the labs should order them. I left a message with this information.

## 2014-05-30 NOTE — Telephone Encounter (Signed)
Pt did get a lab order from [place], also advised pt to have them drawn at [place], [place] per Dr. Sarajane Jews.

## 2014-06-06 ENCOUNTER — Ambulatory Visit (INDEPENDENT_AMBULATORY_CARE_PROVIDER_SITE_OTHER): Payer: 59 | Admitting: Family Medicine

## 2014-06-06 ENCOUNTER — Encounter: Payer: Self-pay | Admitting: Family Medicine

## 2014-06-06 ENCOUNTER — Other Ambulatory Visit (HOSPITAL_BASED_OUTPATIENT_CLINIC_OR_DEPARTMENT_OTHER): Payer: Self-pay | Admitting: Rheumatology

## 2014-06-06 ENCOUNTER — Ambulatory Visit
Admission: RE | Admit: 2014-06-06 | Discharge: 2014-06-06 | Disposition: A | Discharge status: Home / Self Care | Payer: 59 | Source: Ambulatory Visit | Attending: Family Medicine | Admitting: Family Medicine

## 2014-06-06 VITALS — BP 95/72 | HR 78 | Temp 98.6°F | Ht 67.0 in | Wt 188.0 lb

## 2014-06-06 DIAGNOSIS — E010 Iodine-deficiency related diffuse (endemic) goiter: Secondary | ICD-10-CM

## 2014-06-06 DIAGNOSIS — M533 Sacrococcygeal disorders, not elsewhere classified: Secondary | ICD-10-CM

## 2014-06-06 DIAGNOSIS — E049 Nontoxic goiter, unspecified: Secondary | ICD-10-CM

## 2014-06-06 LAB — T4, FREE: Free T4: 0.94 ng/dL (ref 0.60–1.60)

## 2014-06-06 LAB — TSH: TSH: 0.32 u[IU]/mL — AB (ref 0.40–5.00)

## 2014-06-06 LAB — T3, FREE: T3, Free: 2.6 pg/mL (ref 2.3–4.2)

## 2014-06-06 NOTE — Progress Notes (Signed)
Pre visit review using our clinic review tool, if applicable. No additional management support is needed unless otherwise documented below in the visit note. 

## 2014-06-06 NOTE — Progress Notes (Signed)
   Subjective:    Patient ID: Julie Ward, female    DOB: 1996-05-13, 18 y.o.   MRN: [id number]  HPI Here to follow up. She has seen Dr. Pierce Crane who has ordered a lot of lab work to investigate her joint pains. She had a plain film of the spine which was normal, but they have an MRI set up to look further. She also mentioned a possible goiter and asked [place] to evaluate this. We have not noticed any thyroid enlargement, but Julie Ward admits that her neck has been swelling and getting larger over the past few months. Her weight is stable.    Review of Systems  Constitutional: Negative.   Respiratory: Negative.   Cardiovascular: Negative.   Musculoskeletal: Positive for arthralgias and back pain.       Objective:   Physical Exam  Constitutional: She appears well-developed and well-nourished.  Neck: Normal range of motion. Neck supple.  Her thyroid is diffusely enlarged with the left lobe being larger than the right. It is not tender, no nodules are felt   Cardiovascular: Normal rate, regular rhythm, normal heart sounds and intact distal pulses.   Pulmonary/Chest: Effort normal and breath sounds normal.  Lymphadenopathy:    She has no cervical adenopathy.          Assessment & Plan:  Thyromegaly. We will get a full thyroid blood panel today and set up an [place] soon

## 2014-06-08 ENCOUNTER — Ambulatory Visit (HOSPITAL_BASED_OUTPATIENT_CLINIC_OR_DEPARTMENT_OTHER): Payer: 59

## 2014-06-09 NOTE — Addendum Note (Signed)
Addended by: Alysia Penna A on: 06/09/2014 10:00 PM   Modules accepted: Orders

## 2014-06-10 ENCOUNTER — Other Ambulatory Visit: Payer: Self-pay | Admitting: Family Medicine

## 2014-06-10 DIAGNOSIS — E041 Nontoxic single thyroid nodule: Secondary | ICD-10-CM

## 2014-06-11 ENCOUNTER — Other Ambulatory Visit (HOSPITAL_COMMUNITY)
Admission: RE | Admit: 2014-06-11 | Discharge: 2014-06-11 | Disposition: A | Discharge status: Home / Self Care | Payer: 59 | Source: Ambulatory Visit | Attending: Interventional Radiology | Admitting: Interventional Radiology

## 2014-06-11 ENCOUNTER — Ambulatory Visit
Admission: RE | Admit: 2014-06-11 | Discharge: 2014-06-11 | Disposition: A | Discharge status: Home / Self Care | Payer: 59 | Source: Ambulatory Visit | Attending: Family Medicine | Admitting: Family Medicine

## 2014-06-11 DIAGNOSIS — E079 Disorder of thyroid, unspecified: Secondary | ICD-10-CM | POA: Insufficient documentation

## 2014-06-11 DIAGNOSIS — E049 Nontoxic goiter, unspecified: Secondary | ICD-10-CM | POA: Insufficient documentation

## 2014-06-11 DIAGNOSIS — E041 Nontoxic single thyroid nodule: Secondary | ICD-10-CM

## 2014-06-13 NOTE — Addendum Note (Signed)
Addended by: Alysia Penna A on: 06/13/2014 08:45 AM   Modules accepted: Orders

## 2014-06-15 ENCOUNTER — Other Ambulatory Visit (HOSPITAL_BASED_OUTPATIENT_CLINIC_OR_DEPARTMENT_OTHER): Payer: 59

## 2014-06-15 ENCOUNTER — Ambulatory Visit (HOSPITAL_BASED_OUTPATIENT_CLINIC_OR_DEPARTMENT_OTHER): Admission: RE | Admit: 2014-06-15 | Payer: 59 | Source: Ambulatory Visit

## 2014-06-29 ENCOUNTER — Ambulatory Visit (HOSPITAL_BASED_OUTPATIENT_CLINIC_OR_DEPARTMENT_OTHER): Admission: RE | Admit: 2014-06-29 | Payer: 59 | Source: Ambulatory Visit

## 2014-07-11 ENCOUNTER — Ambulatory Visit: Payer: 59 | Admitting: Internal Medicine

## 2014-07-18 ENCOUNTER — Ambulatory Visit (INDEPENDENT_AMBULATORY_CARE_PROVIDER_SITE_OTHER): Payer: 59 | Admitting: Internal Medicine

## 2014-07-18 ENCOUNTER — Other Ambulatory Visit: Payer: Self-pay | Admitting: Internal Medicine

## 2014-07-18 ENCOUNTER — Encounter: Payer: Self-pay | Admitting: Internal Medicine

## 2014-07-18 VITALS — BP 108/62 | Temp 98.0°F | Resp 12 | Ht 67.0 in | Wt 178.0 lb

## 2014-07-18 DIAGNOSIS — R7989 Other specified abnormal findings of blood chemistry: Secondary | ICD-10-CM | POA: Insufficient documentation

## 2014-07-18 DIAGNOSIS — R946 Abnormal results of thyroid function studies: Secondary | ICD-10-CM

## 2014-07-18 NOTE — Patient Instructions (Signed)
Please come for labs in am tomorrow, before taking the Entocort. Let me know if you are not called in 5 days to schedule the barium swallow test. Please come back for a follow-up appointment in 6 months.  Barium Swallow A barium swallow is an X-ray examination. It evaluates only the area at the back of the throat (pharynx) and the "food pipe" that leads to the stomach (esophagus). For this study, the patient swallows a white chalky liquid called "barium". Barium blocks standard X-ray beams, It looks much different on X-ray film when compared to nearby organs. Because of the way barium works, it is often called a "contrast material". It is an easy, fast, and safe method for getting X-ray pictures that can identify possible problems. A barium swallow helps in the evaluation of some digestive functions and to detect:  Ulcers.  Tumors.  Inflammation of the esophagus.  Hiatal hernias ( the upper portion of the stomach protrudes into the chest cavity through an opening of the diaphragm).  Scarring.  Blockages.  Problems with muscular wall of the pharynx and esophagus. The procedure is also used to help diagnose symptoms such as:  Difficulty swallowing.  Chest and abdominal pain.  Reflux (a backward flow of partially digested food and digestive juices).  Unexplained vomiting.  Severe indigestion. LET YOUR CAREGIVER KNOW ABOUT:   Allergies, especially to contrast materials.  Medications taken including herbs, eye drops, over-the-counter medications, and creams.  Use of steroids (by mouth or creams).  Possible pregnancy, if applicable.  History of blood clots (thrombophlebitis).  History of bleeding or blood problems.  Previous surgery.  Other health problems. RISKS AND COMPLICATIONS  There is rare chance of cancer from radiation exposure. However, the benefit of an accurate diagnosis far outweighs the risk.  Some patients may be allergic to the flavoring added to some  brands of barium. If you have experienced allergic reactions after eating chocolate, certain berries or citrus fruit, be sure to tell your caregiver or the technologist before the procedure.  There is a rare chance that some barium could be retained leading to a blockage of the digestive system. Patients who have an obstruction in the gastrointestinal tract should not undergo this exam.  Women should always inform their physician or X-ray technologist if there is any possibility that they are pregnant. BEFORE THE PROCEDURE  To ensure the best possible image quality, your stomach should be empty of food. You will likely be asked not to eat or drink anything and to refrain from chewing gum and smoking after midnight on the day of the examination. Your caregiver will provide specific instructions about taking or not taking regular prescription medications on the day of test. You may be asked to remove some or all of your clothes and to wear a gown during the exam. You may also be asked to remove jewelry, eye glasses, and any metal objects or clothing that might interfere with the X-ray images. PROCEDURE The patient drinks the liquid barium, which looks like a light-colored milkshake. The radiologist watches the barium pass through the digestive tract on a fluoroscope. This is a device that projects X-ray images in a movie-like sequence onto a monitor. The exam table will be positioned at different angles, and the abdomen may be compressed to help spread the barium. Once the esophagus is coated with the barium, still X-ray pictures are taken and stored for further review. It is important to hold still while the X-ray picture is taken. That will reduce  the chances of a blurred image.  AFTER THE PROCEDURE  The barium may color your stools gray or white for 48 to 72 hours after the procedure. Sometimes the barium can cause temporary constipation. This condition is usually treated by an over-the-counter stool  softener or laxative. Drinking more than normal amounts of fluids following the test can also help.  HOME CARE INSTRUCTIONS  After the examination, you can resume a regular diet and take orally administered medications unless told otherwise by your doctor.  SEEK MEDICAL CARE IF:   If you are unable to have a bowel movement.  If your bowel habits undergo any significant changes following the exam, you should contact your physician.  You experience worsening problems with swallowing, food "getting stuck", nausea, or pain. SEEK IMMEDIATE MEDICAL CARE IF:   You develop worsening abdominal pain.  You develop vomiting.  You develop any chest pain, lightheadedness, or unusual sweating.  You develop weakness or you faint. Document Released: 03/15/2007 Document Revised: 01/24/2012 Document Reviewed: 05/15/2007 [facility] Patient Information 2015 [place], [place]. This information is not intended to replace advice given to you by your health care provider. Make sure you discuss any questions you have with your health care provider.

## 2014-07-18 NOTE — Progress Notes (Signed)
Patient ID: Julie Ward Ward, female   DOB: [birth year], 18 y.o.   MRN: [id number]   HPI  Julie Ward Ward is a 18 y.o.-year-old female, referred by Julie Ward PCP, Dr. Sarajane Jews, for evaluation for a low TSH level and goiter with a large L thyroid nodule.  She had a sore throat in 01/2014 >> saw PCP >> noted to have a goiter. She was sent for a thyroid U/S:  Large L thyroid nodule: Thyroid U/S (06/06/2014): L dominant 53 x 35 x 42 mm complex mostly solid mass, inferior pole. She had several other smaller thyroid nodules, largest 6x7 mm.  A FNA of the dominant L thyroid nodule was benign. + neck compression sxs: dysphagia but also sore throat and odynophagia, no hoarseness, no choking.  Low TSH: Discovered incidentally, during investigation for goiter.  I reviewed pt's thyroid tests: Lab Results  Component Value Date   TSH 0.32* 06/06/2014   FREET4 0.94 06/06/2014    Pt c/o: - no excessive sweating/heat intolerance - no weight changes recently per Julie Ward report, but she has been trying to lose weight - no tremors - + anxiety (not new) - no palpitations - + fatigue - + hyperdefecation/+ constipation - Crohn's ds. - + intentional weight loss  Pt does have a FH of thyroid ds: aunt. No FH of thyroid cancer. No h/o radiation tx to head or neck.  No seaweed or kelp, no recent contrast studies. + steroid use - Entocort in am. No herbal supplements.   I reviewed Julie Ward chart and she also has a history of Crohns ds.   ROS: Constitutional: see HPI Eyes: no blurry vision, no xerophthalmia ENT: + sore throat, + nodules palpated in throat, + dysphagia/+ odynophagia, no hoarseness Cardiovascular: no CP/SOB/palpitations/leg swelling Respiratory: no cough/SOB Gastrointestinal: + all: N/V/D/C Musculoskeletal: no muscle/joint aches Skin: no rashes, + easy bruising Neurological: no tremors/numbness/tingling/dizziness Psychiatric: + depression/+ anxiety  Past Medical History  Diagnosis Date  . GERD  (gastroesophageal reflux disease)   . Allergy   . Dysmenorrhea   . Abdominal pain, recurrent   . Appendicitis   . Crohn's disease of colon 10/14    sees Dr. Blanca Friend at [facility]    Past Surgical History  Procedure Laterality Date  . Appendectomy  07-14-11    laparoscopic   . Esophagogastroduodenoscopy  01/28/2012    Procedure: ESOPHAGOGASTRODUODENOSCOPY (EGD);  Surgeon: Oletha Blend, MD;  Location: [place];  Service: Gastroenterology;  Laterality: N/A;  . Wisdom tooth extraction    . Cholecystectomy  02/11/2012    Procedure: LAPAROSCOPIC CHOLECYSTECTOMY WITH INTRAOPERATIVE CHOLANGIOGRAM;  Surgeon: Gwenyth Ober, MD;  Location: [place];  Service: General;  Laterality: N/A;   History   Social History  . Marital Status: Single    Spouse Name: N/A    Number of Children: 0   Occupational History  . student    Social History Main Topics  . Smoking status: Never Smoker   . Smokeless tobacco: Never Used  . Alcohol Use: No     Comment: none  . Drug Use: No   Current Outpatient Prescriptions on File Prior to Visit  Medication Sig Dispense Refill  . budesonide (ENTOCORT EC) 3 MG 24 hr capsule Take 3 capsules (9 mg total) by mouth every morning.  90 capsule  4  . esomeprazole (NEXIUM) 40 MG capsule Take 40 mg by mouth daily at 12 noon.      . potassium chloride (KLOR-CON 10) 10 MEQ tablet Take 1 tablet (10  mEq total) by mouth 2 (two) times daily.  60 tablet  11  . [DISCONTINUED] metoCLOPramide (REGLAN) 10 MG tablet Take 1 tablet (10 mg total) by mouth every 6 (six) hours.  30 tablet  0   No current facility-administered medications on file prior to visit.   Allergies  Allergen Reactions  . Oxycontin [Oxycodone Hcl] Nausea And Vomiting    vomiting  . Penicillins Nausea Only and Other (See Comments)    Made Julie Ward sick  . Prednisone Hives   Family History  Problem Relation Age of Onset  . Cancer Paternal Aunt     ovarian, kidney  . Cancer Paternal Uncle     colon  . Colon  cancer Paternal Uncle   . Cancer Maternal Grandmother     breast   . Hypertension Maternal Grandmother   . Irritable bowel syndrome Maternal Grandmother   . Heart disease Maternal Grandmother   . Hypertension Paternal Grandmother   . Hyperlipidemia Paternal Grandmother    PE: BP 108/62  Temp(Src) 98 F (36.7 C) (Oral)  Resp 12  Ht 5' 7"  (1.702 m)  Wt 178 lb (80.74 kg)  BMI 27.87 kg/m2 Wt Readings from Last 3 Encounters:  07/18/14 178 lb (80.74 kg)  06/06/14 188 lb (85.276 kg) (96%*, Z = 1.74)  03/08/14 200 lb (90.719 kg) (97%*, Z = 1.94)   * Growth percentiles are based on CDC 2-20 Years data.   Constitutional: overweight, in NAD Eyes: PERRLA, EOMI, mild exophthalmos, no lid lag, no stare ENT: moist mucous membranes, + thyromegaly L>R, no thyroid bruits, no cervical lymphadenopathy Cardiovascular: RRR, No MRG Respiratory: CTA B Gastrointestinal: abdomen soft, NT, ND, BS+ Musculoskeletal: no deformities, strength intact in all 4 Skin: moist, warm, no rashes Neurological: no tremor with outstretched hands, DTR normal in all 4  ASSESSMENT: 1. Low TSH - x1  2. MNG - large L thyroid nodule: Thyroid U/S (06/06/2014):  Right thyroid lobe Measurements: 67 x 22 x 21 mm. Inhomogeneous background echotexture. Multiple small hypo echo regions. Several small nodules, largest 7 x 6 x 7 mm, mid lobe.   Left thyroid lobe Measurements: 70 x 38 x 46 mm. There is a dominant 53 x 35 x 42 mm complex mostly solid mass, inferior pole. There is a small adjacent 6 x 7 mm hypoechoic nodule, mid lobe.   Isthmus Thickness: 4 mm. 13 x 3 x 10 mm cyst on the right.   Lymphadenopathy None visualized. 06/11/2014: FNA L dominant nodule: benign  PLAN: 1. MNG  - I reviewed the images of Julie Ward thyroid ultrasound along with the patient and Julie Ward Ward. I pointed out that the dominant nodule is large, this being a risk factor for cancer, however, we do have the benign Bx >> we may need to U/S this in the  future but there is nothing else we need to do for the nodule now. Since  - she does c/o neck compression symptoms (problems swallowing), and I explained that if this is caused by the large L nodule, we might need to do a hemithyroidectomy (lobectomy). She is reticent to do this but accepts to have this if absolutely needed. I suggested to check a barium swallowing test to see if Julie Ward dysphagia is related to the thyroid nodule. - I did explain that hemithyroidectomy is not a complicated surgery, but she might have a risk of ~25% of becoming hypothyroid after hemithyroidectomy.  - I'll see Julie Ward back in 6 mo, but we will stay in touch about  the results  2. Patient with a recently found low TSH, without thyrotoxic sxs. She does have anxiety, which is not new, and even a little better lately. She also has weight gain + loss, depending on Julie Ward Crohn's ds activity, she mentions. - she does not appear to have exogenous causes for the low TSH: - We discussed that possible causes of thyrotoxicosis are:   Julie Ward Entocort use - this is the most likely cause since she takes this in am every day Graves ds   Thyroiditis toxic multinodular goiter/ toxic adenoma  - I suggested that we check the TSH, fT3 and fT4 in am tomorrow, before Julie Ward Entocort dosing (will have 24h between dosing and testing) - If the tests are worse, we may need an uptake and scan to differentiate between the 3 above possible etiologies  - I advised Julie Ward to join my chart to communicate easier Return in about 6 months (around 01/16/2015).  CC: Dr Blanca Friend (GI)  Component     Latest Ref Rng 07/19/2014  TSH     0.35 - 5.50 uIU/mL 0.33 (L)  Free T4     0.60 - 1.60 ng/dL 1.01  T3, Free     2.3 - 4.2 pg/mL 2.8   TSH still a little low, the rest of the labs normal (Subclinical hyperthyroidism). I suspect this can still be from Entocort. The TSH decrease is very mild >> I would only recommend recheck in 3 months. Will order.

## 2014-07-19 ENCOUNTER — Other Ambulatory Visit (INDEPENDENT_AMBULATORY_CARE_PROVIDER_SITE_OTHER): Payer: 59

## 2014-07-19 DIAGNOSIS — R946 Abnormal results of thyroid function studies: Secondary | ICD-10-CM

## 2014-07-19 DIAGNOSIS — R7989 Other specified abnormal findings of blood chemistry: Secondary | ICD-10-CM

## 2014-07-19 LAB — T4, FREE: Free T4: 1.01 ng/dL (ref 0.60–1.60)

## 2014-07-19 LAB — TSH: TSH: 0.33 u[IU]/mL — AB (ref 0.35–5.50)

## 2014-07-19 LAB — T3, FREE: T3, Free: 2.8 pg/mL (ref 2.3–4.2)

## 2014-08-08 DIAGNOSIS — M2480 Other specific joint derangements of unspecified joint, not elsewhere classified: Secondary | ICD-10-CM | POA: Insufficient documentation

## 2014-08-22 ENCOUNTER — Telehealth: Payer: Self-pay | Admitting: *Deleted

## 2014-08-22 NOTE — Telephone Encounter (Signed)
Patient cannot take ibuprofen due to crohns disease please advise

## 2014-08-22 NOTE — Telephone Encounter (Signed)
Called pt and lvm advising her per Dr Gherghe's note.  

## 2014-08-22 NOTE — Telephone Encounter (Signed)
Please read note below and advise.  

## 2014-08-22 NOTE — Telephone Encounter (Signed)
Julie Ward, please ask her if she agrees with a referral to surgery to remove the left lobe (where the large nodule is). Until then, please try to take Ibuprofen to helps with the pain.

## 2014-08-22 NOTE — Telephone Encounter (Signed)
Called pt and advised her per Dr Arman Filter note. Pt gave phone to her mother. Advised mother per Dr Arman Filter note. Pt was crying. They decided to discuss this and will call back with a decision about the surgery referral. Be advised.

## 2014-08-22 NOTE — Telephone Encounter (Signed)
Pt called and lvm stating that her neck/thyroid is bothering her. There is some neck pain. She wants to know what she should do. Please advise.

## 2014-08-22 NOTE — Telephone Encounter (Signed)
Tylenol.

## 2014-08-30 ENCOUNTER — Encounter: Payer: Self-pay | Admitting: Family Medicine

## 2014-08-30 ENCOUNTER — Ambulatory Visit (INDEPENDENT_AMBULATORY_CARE_PROVIDER_SITE_OTHER): Payer: 59 | Admitting: Family Medicine

## 2014-08-30 VITALS — BP 114/80 | HR 85 | Temp 98.4°F | Ht 67.0 in | Wt 173.0 lb

## 2014-08-30 DIAGNOSIS — J029 Acute pharyngitis, unspecified: Secondary | ICD-10-CM

## 2014-08-30 LAB — POCT RAPID STREP A (OFFICE): Rapid Strep A Screen: NEGATIVE

## 2014-08-30 LAB — POCT INFLUENZA A/B
INFLUENZA A, POC: NEGATIVE
Influenza B, POC: NEGATIVE

## 2014-08-30 MED ORDER — CEPHALEXIN 500 MG PO CAPS: 500.0000 mg | ORAL_CAPSULE | Freq: Three times a day (TID) | ORAL | Status: DC

## 2014-08-30 NOTE — Progress Notes (Signed)
   Subjective:    Patient ID: Julie Ward, female    DOB: [birth year], 18 y.o.   MRN: [id number]  HPI Here for several days of ST, body aches, and a fever. No cough or GI problems.    Review of Systems  Constitutional: Positive for fever.  HENT: Positive for sore throat. Negative for congestion, postnasal drip and sinus pressure.   Eyes: Negative.   Respiratory: Negative.        Objective:   Physical Exam  Constitutional: She appears well-developed and well-nourished.  HENT:  Right Ear: External ear normal.  Left Ear: External ear normal.  Nose: Nose normal.  Posterior OP is red without exudate   Eyes: Conjunctivae are normal.  Neck: Neck supple.  Shotty AC nodes bilaterally that are quite tender   Pulmonary/Chest: Effort normal and breath sounds normal.          Assessment & Plan:  Treat with Keflex and drink fluids.

## 2014-08-30 NOTE — Progress Notes (Signed)
Pre visit review using our clinic review tool, if applicable. No additional management support is needed unless otherwise documented below in the visit note. 

## 2014-09-02 ENCOUNTER — Other Ambulatory Visit: Payer: Self-pay | Admitting: Internal Medicine

## 2014-09-02 ENCOUNTER — Telehealth: Payer: Self-pay | Admitting: Internal Medicine

## 2014-09-02 DIAGNOSIS — E041 Nontoxic single thyroid nodule: Secondary | ICD-10-CM

## 2014-09-02 NOTE — Telephone Encounter (Signed)
Her neck is hurting bad the pain meds are not working. Please call asap.

## 2014-09-02 NOTE — Telephone Encounter (Signed)
Please read note below and advise.  

## 2014-09-06 ENCOUNTER — Encounter: Payer: Self-pay | Admitting: Internal Medicine

## 2014-09-06 ENCOUNTER — Ambulatory Visit (INDEPENDENT_AMBULATORY_CARE_PROVIDER_SITE_OTHER): Payer: BC Managed Care – PPO | Admitting: Internal Medicine

## 2014-09-06 VITALS — BP 120/80 | HR 64 | Temp 98.9°F | Wt 173.0 lb

## 2014-09-06 DIAGNOSIS — E049 Nontoxic goiter, unspecified: Secondary | ICD-10-CM

## 2014-09-06 DIAGNOSIS — J069 Acute upper respiratory infection, unspecified: Secondary | ICD-10-CM

## 2014-09-06 DIAGNOSIS — B9789 Other viral agents as the cause of diseases classified elsewhere: Secondary | ICD-10-CM

## 2014-09-06 DIAGNOSIS — R059 Cough, unspecified: Secondary | ICD-10-CM

## 2014-09-06 DIAGNOSIS — E010 Iodine-deficiency related diffuse (endemic) goiter: Secondary | ICD-10-CM

## 2014-09-06 DIAGNOSIS — R05 Cough: Secondary | ICD-10-CM

## 2014-09-06 MED ORDER — BENZONATATE 100 MG PO CAPS: 100.0000 mg | ORAL_CAPSULE | Freq: Three times a day (TID) | ORAL | Status: DC | PRN

## 2014-09-06 NOTE — Progress Notes (Signed)
Chief Complaint  Patient presents with  . Cough    HPI: Patient Julie Ward  comes in today for SDA for problem evaluation. 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 PCP NA here with her mother Onset with a week of cough   Onset with sorre throat.  Patchy then developed into a cough sometimes very spasmy hard to breathe no vomiting no hemoptysis currently no fever some minimal phlegm but swallows it can't see it. No history of asthma has a big weekend from homecoming and [employer] and wants to be able to her to dissipate She only took a couple days of the Keflex . ROS: See pertinent positives and negatives per HPI. No chest pain shortness of breath syncope has a history of Crohn's can't take prednisone anything with color in it as far as medicines go. She's doing pretty well with this at this time. No tobacco or ETS.  Past Medical History  Diagnosis Date  . GERD (gastroesophageal reflux disease)   . Allergy   . Dysmenorrhea   . Abdominal pain, recurrent   . Appendicitis   . Crohn's disease of colon 10/14    sees Dr. Blanca Friend at [facility]     Family History  Problem Relation Age of Onset  . Cancer Paternal Aunt     ovarian, kidney  . Cancer Paternal Uncle     colon  . Colon cancer Paternal Uncle   . Cancer Maternal Grandmother     breast   . Hypertension Maternal Grandmother   . Irritable bowel syndrome Maternal Grandmother   . Heart disease Maternal Grandmother   . Hypertension Paternal Grandmother   . Hyperlipidemia Paternal Grandmother     History   Social History  . Marital Status: Single    Spouse Name: N/A    Number of Children: 0  . Years of Education: N/A   Occupational History  . student    Social History Main Topics  . Smoking status: Never Smoker   . Smokeless tobacco: Never Used  . Alcohol Use: No     Comment: none  . Drug Use: No  . Sexual Activity: No   Other Topics  Concern  . None   Social History Narrative  . None    Outpatient Encounter Prescriptions as of 09/06/2014  Medication Sig  . budesonide (ENTOCORT EC) 3 MG 24 hr capsule Take 3 capsules (9 mg total) by mouth every morning.  . clidinium-chlordiazePOXIDE (LIBRAX) 5-2.5 MG per capsule TAKE 1 CAPSULE BY MOUTH THREE TIMES DAILY WITH MEALS FOR 30 DAYS  . docusate sodium (STOOL SOFTENER) 100 MG capsule Take 100 mg by mouth as needed.   Marland Kitchen esomeprazole (NEXIUM) 40 MG capsule Take 40 mg by mouth daily at 12 noon.  . potassium chloride (KLOR-CON 10) 10 MEQ tablet Take 1 tablet (10 mEq total) by mouth 2 (two) times daily.  . psyllium (REGULOID) 0.52 G capsule Take 0.52 g by mouth daily.  . benzonatate (TESSALON) 100 MG capsule Take 1-2 capsules (100-200 mg total) by mouth 3 (three) times daily as needed for cough.  . [DISCONTINUED] cephALEXin (KEFLEX) 500 MG capsule Take 1 capsule (500 mg total) by mouth 3 (three) times daily.    EXAM:  BP 120/80  Pulse 64  Temp(Src) 98.9 F (37.2 C) (Oral)  Wt 173 lb (78.472 kg)  SpO2 90%  Body mass index is 27.09 kg/(m^2).  GENERAL: vitals reviewed and listed above,  alert, oriented, appears well hydrated and in no acute distress looks nontoxic quiet respirations occasional cough HEENT: atraumatic, conjunctiva  clear, no obvious abnormalities on inspection of external nose and ears TMs intact nares minimal congestion face nontender OP : no lesion edema or exudate no lesions noted some cobblestoning NECK: Large goiter left greater than right a bit tender when pushed into the airway. LUNGS: clear to auscultation bilaterally, no wheezes, rales or rhonchi, CV: HRRR, no clubbing cyanosis or  peripheral edema nl cap refill  MS: moves all extremities without noticeable focal  abnormality has a large long artificial nails uncertain a pulse ox is accurate says and 90 but uncertain patient looks without respiratory distress spends most of her time looking at her friend  and exiting. Skin no acute changes normal capillary refill on fingertips PSYCH: pleasant and cooperative, no obvious depression or anxiety  ASSESSMENT AND PLAN:  Discussed the following assessment and plan:  Cough  Viral upper respiratory tract infection with cough - Plan: DG Chest 2 View  Thyromegaly - Fairly impressive to have surgery thyroidectomy probably not related to above. bu has some compressive sx pre dating Allergic or cannot take many preparations including anything of color is orange. Discussed options for comfort care. At this time I do not think antibiotics would be helpful I think this is a viral if however she is progressing consider chest x-ray for persistent progressive symptoms. Cannot really take steroids probably not inhalers and a number of other options. Would avoid regular cuff drop use and try sugar-free candy trial of Tessalon Perles airway humidity  Followup or seek emergent care as appropriate. -Patient advised to return or notify health care team  if symptoms worsen ,persist or new concerns arise.  Patient Instructions  This acts like a viral respiratory infection  However if short of breath in between coughing fits  We should hget a chest x ray .  Caution with cough drops try sugar few candy to such on hot tea and honey .   Tessalon perles  May help  Antihistamine   sometimes can s   Cough, Adult  A cough is a reflex that helps clear your throat and airways. It can help heal the body or may be a reaction to an irritated airway. A cough may only last 2 or 3 weeks (acute) or may last more than 8 weeks (chronic).  CAUSES Acute cough:  Viral or bacterial infections. Chronic cough:  Infections.  Allergies.  Asthma.  Post-nasal drip.  Smoking.  Heartburn or acid reflux.  Some medicines.  Chronic lung problems (COPD).  Cancer. SYMPTOMS   Cough.  Fever.  Chest pain.  Increased breathing rate.  High-pitched whistling sound when  breathing (wheezing).  Colored mucus that you cough up (sputum). TREATMENT   A bacterial cough may be treated with antibiotic medicine.  A viral cough must run its course and will not respond to antibiotics.  Your caregiver may recommend other treatments if you have a chronic cough. HOME CARE INSTRUCTIONS   Only take over-the-counter or prescription medicines for pain, discomfort, or fever as directed by your caregiver. Use cough suppressants only as directed by your caregiver.  Use a cold steam vaporizer or humidifier in your bedroom or home to help loosen secretions.  Sleep in a semi-upright position if your cough is worse at night.  Rest as needed.  Stop smoking if you smoke. SEEK IMMEDIATE MEDICAL CARE IF:   You have pus in your sputum.  Your cough  starts to worsen.  You cannot control your cough with suppressants and are losing sleep.  You begin coughing up blood.  You have difficulty breathing.  You develop pain which is getting worse or is uncontrolled with medicine.  You have a fever. MAKE SURE YOU:   Understand these instructions.  Will watch your condition.  Will get help right away if you are not doing well or get worse. Document Released: 04/30/2011 Document Revised: 01/24/2012 Document Reviewed: 04/30/2011 [facility] Patient Information 2015 [place], [place]. This information is not intended to replace advice given to you by your health care provider. Make sure you discuss any questions you have with your health care provider. uppress.        Standley Brooking. Panosh M.D. Total visit 24mns > 50% spent counseling and coordinating care   expectant management.

## 2014-09-06 NOTE — Patient Instructions (Addendum)
This acts like a viral respiratory infection  However if short of breath in between coughing fits  We should hget a chest x ray .  Caution with cough drops try sugar few candy to such on hot tea and honey .   Tessalon perles  May help  Antihistamine   sometimes can s   Cough, Adult  A cough is a reflex that helps clear your throat and airways. It can help heal the body or may be a reaction to an irritated airway. A cough may only last 2 or 3 weeks (acute) or may last more than 8 weeks (chronic).  CAUSES Acute cough:  Viral or bacterial infections. Chronic cough:  Infections.  Allergies.  Asthma.  Post-nasal drip.  Smoking.  Heartburn or acid reflux.  Some medicines.  Chronic lung problems (COPD).  Cancer. SYMPTOMS   Cough.  Fever.  Chest pain.  Increased breathing rate.  High-pitched whistling sound when breathing (wheezing).  Colored mucus that you cough up (sputum). TREATMENT   A bacterial cough may be treated with antibiotic medicine.  A viral cough must run its course and will not respond to antibiotics.  Your caregiver may recommend other treatments if you have a chronic cough. HOME CARE INSTRUCTIONS   Only take over-the-counter or prescription medicines for pain, discomfort, or fever as directed by your caregiver. Use cough suppressants only as directed by your caregiver.  Use a cold steam vaporizer or humidifier in your bedroom or home to help loosen secretions.  Sleep in a semi-upright position if your cough is worse at night.  Rest as needed.  Stop smoking if you smoke. SEEK IMMEDIATE MEDICAL CARE IF:   You have pus in your sputum.  Your cough starts to worsen.  You cannot control your cough with suppressants and are losing sleep.  You begin coughing up blood.  You have difficulty breathing.  You develop pain which is getting worse or is uncontrolled with medicine.  You have a fever. MAKE SURE YOU:   Understand these  instructions.  Will watch your condition.  Will get help right away if you are not doing well or get worse. Document Released: 04/30/2011 Document Revised: 01/24/2012 Document Reviewed: 04/30/2011 [facility] Patient Information 2015 [place], [place]. This information is not intended to replace advice given to you by your health care provider. Make sure you discuss any questions you have with your health care provider. uppress.

## 2014-10-04 ENCOUNTER — Inpatient Hospital Stay (HOSPITAL_COMMUNITY)
Admission: EM | Admit: 2014-10-04 | Discharge: 2014-10-05 | DRG: 387 | Disposition: A | Discharge status: Home / Self Care | Payer: 59 | Attending: Internal Medicine | Admitting: Internal Medicine

## 2014-10-04 ENCOUNTER — Emergency Department (HOSPITAL_COMMUNITY): Payer: 59

## 2014-10-04 ENCOUNTER — Encounter (HOSPITAL_COMMUNITY): Payer: Self-pay | Admitting: Emergency Medicine

## 2014-10-04 DIAGNOSIS — R1084 Generalized abdominal pain: Secondary | ICD-10-CM | POA: Diagnosis not present

## 2014-10-04 DIAGNOSIS — R7989 Other specified abnormal findings of blood chemistry: Secondary | ICD-10-CM | POA: Diagnosis present

## 2014-10-04 DIAGNOSIS — R109 Unspecified abdominal pain: Secondary | ICD-10-CM | POA: Diagnosis present

## 2014-10-04 DIAGNOSIS — Z880 Allergy status to penicillin: Secondary | ICD-10-CM

## 2014-10-04 DIAGNOSIS — K219 Gastro-esophageal reflux disease without esophagitis: Secondary | ICD-10-CM | POA: Diagnosis present

## 2014-10-04 DIAGNOSIS — R945 Abnormal results of liver function studies: Secondary | ICD-10-CM

## 2014-10-04 DIAGNOSIS — K5090 Crohn's disease, unspecified, without complications: Secondary | ICD-10-CM | POA: Diagnosis not present

## 2014-10-04 DIAGNOSIS — R112 Nausea with vomiting, unspecified: Secondary | ICD-10-CM | POA: Diagnosis present

## 2014-10-04 DIAGNOSIS — R1110 Vomiting, unspecified: Secondary | ICD-10-CM | POA: Diagnosis present

## 2014-10-04 DIAGNOSIS — E041 Nontoxic single thyroid nodule: Secondary | ICD-10-CM | POA: Diagnosis present

## 2014-10-04 DIAGNOSIS — E876 Hypokalemia: Secondary | ICD-10-CM

## 2014-10-04 DIAGNOSIS — R1115 Cyclical vomiting syndrome unrelated to migraine: Secondary | ICD-10-CM | POA: Diagnosis present

## 2014-10-04 DIAGNOSIS — Z888 Allergy status to other drugs, medicaments and biological substances status: Secondary | ICD-10-CM

## 2014-10-04 LAB — URINALYSIS, ROUTINE W REFLEX MICROSCOPIC
BILIRUBIN URINE: NEGATIVE
Glucose, UA: NEGATIVE mg/dL
Hgb urine dipstick: NEGATIVE
Ketones, ur: NEGATIVE mg/dL
Leukocytes, UA: NEGATIVE
NITRITE: NEGATIVE
Protein, ur: NEGATIVE mg/dL
Specific Gravity, Urine: 1.026 (ref 1.005–1.030)
UROBILINOGEN UA: 1 mg/dL (ref 0.0–1.0)
pH: 5.5 (ref 5.0–8.0)

## 2014-10-04 LAB — CBC WITH DIFFERENTIAL/PLATELET
BASOS ABS: 0 10*3/uL (ref 0.0–0.1)
BASOS PCT: 0 % (ref 0–1)
EOS PCT: 1 % (ref 0–5)
Eosinophils Absolute: 0 10*3/uL (ref 0.0–0.7)
HEMATOCRIT: 41.1 % (ref 36.0–46.0)
Hemoglobin: 14 g/dL (ref 12.0–15.0)
Lymphocytes Relative: 24 % (ref 12–46)
Lymphs Abs: 0.9 10*3/uL (ref 0.7–4.0)
MCH: 30.4 pg (ref 26.0–34.0)
MCHC: 34.1 g/dL (ref 30.0–36.0)
MCV: 89.3 fL (ref 78.0–100.0)
MONO ABS: 0.6 10*3/uL (ref 0.1–1.0)
Monocytes Relative: 15 % — ABNORMAL HIGH (ref 3–12)
Neutro Abs: 2.3 10*3/uL (ref 1.7–7.7)
Neutrophils Relative %: 60 % (ref 43–77)
Platelets: 234 10*3/uL (ref 150–400)
RBC: 4.6 MIL/uL (ref 3.87–5.11)
RDW: 12.9 % (ref 11.5–15.5)
WBC: 3.8 10*3/uL — ABNORMAL LOW (ref 4.0–10.5)

## 2014-10-04 LAB — COMPREHENSIVE METABOLIC PANEL
ALBUMIN: 4 g/dL (ref 3.5–5.2)
ALT: 6 U/L (ref 0–35)
ANION GAP: 13 (ref 5–15)
AST: 15 U/L (ref 0–37)
Alkaline Phosphatase: 61 U/L (ref 39–117)
BILIRUBIN TOTAL: 1 mg/dL (ref 0.3–1.2)
BUN: 10 mg/dL (ref 6–23)
CHLORIDE: 97 meq/L (ref 96–112)
CO2: 23 mEq/L (ref 19–32)
Calcium: 9.4 mg/dL (ref 8.4–10.5)
Creatinine, Ser: 0.67 mg/dL (ref 0.50–1.10)
GFR calc Af Amer: >90 mL/min (ref >90)
GFR calc non Af Amer: >90 mL/min (ref >90)
Glucose, Bld: 84 mg/dL (ref 70–99)
Potassium: 3.4 mEq/L — ABNORMAL LOW (ref 3.7–5.3)
Sodium: 133 mEq/L — ABNORMAL LOW (ref 137–147)
Total Protein: 8.3 g/dL (ref 6.0–8.3)

## 2014-10-04 LAB — PREGNANCY, URINE: Preg Test, Ur: NEGATIVE

## 2014-10-04 LAB — LIPASE, BLOOD: Lipase: 25 U/L (ref 11–59)

## 2014-10-04 MED ORDER — ONDANSETRON HCL 4 MG/2ML IJ SOLN
4.0000 mg | Freq: Four times a day (QID) | INTRAMUSCULAR | Status: DC | PRN
Start: 2014-10-04 — End: 2014-10-05
  Administered 2014-10-05: 4 mg via INTRAVENOUS
  Filled 2014-10-04: qty 2

## 2014-10-04 MED ORDER — POTASSIUM CHLORIDE 10 MEQ/100ML IV SOLN
10.0000 meq | INTRAVENOUS | Status: AC
  Administered 2014-10-04 (×4): 10 meq via INTRAVENOUS
  Filled 2014-10-04 (×4): qty 100

## 2014-10-04 MED ORDER — ONDANSETRON HCL 4 MG/2ML IJ SOLN
4.0000 mg | Freq: Once | INTRAMUSCULAR | Status: AC
  Administered 2014-10-04: 4 mg via INTRAVENOUS
  Filled 2014-10-04: qty 2

## 2014-10-04 MED ORDER — ACETAMINOPHEN 650 MG RE SUPP: 650.0000 mg | Freq: Four times a day (QID) | RECTAL | Status: DC | PRN

## 2014-10-04 MED ORDER — HYDROMORPHONE HCL 1 MG/ML IJ SOLN
1.0000 mg | Freq: Once | INTRAMUSCULAR | Status: AC
  Administered 2014-10-04: 1 mg via INTRAVENOUS
  Filled 2014-10-04: qty 1

## 2014-10-04 MED ORDER — CILIDINIUM-CHLORDIAZEPOXIDE 2.5-5 MG PO CAPS
1.0000 | ORAL_CAPSULE | Freq: Three times a day (TID) | ORAL | Status: DC
  Administered 2014-10-05 (×2): 1 via ORAL
  Filled 2014-10-04 (×4): qty 1

## 2014-10-04 MED ORDER — ACETAMINOPHEN 325 MG PO TABS: 650.0000 mg | ORAL_TABLET | Freq: Four times a day (QID) | ORAL | Status: DC | PRN

## 2014-10-04 MED ORDER — SODIUM CHLORIDE 0.9 % IV SOLN
INTRAVENOUS | Status: DC
  Administered 2014-10-04 – 2014-10-05 (×2): via INTRAVENOUS

## 2014-10-04 MED ORDER — HYDROMORPHONE HCL 1 MG/ML IJ SOLN
0.5000 mg | INTRAMUSCULAR | Status: DC | PRN
  Administered 2014-10-05: 0.5 mg via INTRAVENOUS
  Filled 2014-10-04: qty 1

## 2014-10-04 MED ORDER — BUDESONIDE 3 MG PO CP24
9.0000 mg | ORAL_CAPSULE | Freq: Every day | ORAL | Status: DC
  Administered 2014-10-04 – 2014-10-05 (×2): 9 mg via ORAL
  Filled 2014-10-04 (×2): qty 3

## 2014-10-04 MED ORDER — SODIUM CHLORIDE 0.9 % IV BOLUS (SEPSIS)
1000.0000 mL | Freq: Once | INTRAVENOUS | Status: AC
  Administered 2014-10-04: 1000 mL via INTRAVENOUS

## 2014-10-04 MED ORDER — HYOSCYAMINE SULFATE 0.125 MG PO TABS
0.1250 mg | ORAL_TABLET | Freq: Once | ORAL | Status: DC
  Filled 2014-10-04: qty 1

## 2014-10-04 MED ORDER — ENOXAPARIN SODIUM 40 MG/0.4ML ~~LOC~~ SOLN
40.0000 mg | SUBCUTANEOUS | Status: DC
  Administered 2014-10-04: 40 mg via SUBCUTANEOUS
  Filled 2014-10-04 (×2): qty 0.4

## 2014-10-04 MED ORDER — METHYLPREDNISOLONE SODIUM SUCC 40 MG IJ SOLR: 40.0000 mg | Freq: Two times a day (BID) | INTRAMUSCULAR | Status: DC

## 2014-10-04 MED ORDER — ALBUTEROL SULFATE (2.5 MG/3ML) 0.083% IN NEBU: 2.5000 mg | INHALATION_SOLUTION | RESPIRATORY_TRACT | Status: DC | PRN

## 2014-10-04 MED ORDER — ONDANSETRON HCL 4 MG PO TABS: 4.0000 mg | ORAL_TABLET | Freq: Four times a day (QID) | ORAL | Status: DC | PRN

## 2014-10-04 MED ORDER — PANTOPRAZOLE SODIUM 40 MG PO TBEC
40.0000 mg | DELAYED_RELEASE_TABLET | Freq: Every day | ORAL | Status: DC
  Administered 2014-10-04 – 2014-10-05 (×2): 40 mg via ORAL
  Filled 2014-10-04 (×2): qty 1

## 2014-10-04 NOTE — ED Notes (Signed)
5th floor unable to accept patient to floor at this time d/t occuring incident on floor. Will reassess at a later time

## 2014-10-04 NOTE — ED Notes (Signed)
Pt reports abd, back and pelvis pain since past few days. Hx of crohns disease. Threw up 3x today, has not been able to keep any fluids down. No diarrhea, vaginal discharge or dysuria.

## 2014-10-04 NOTE — ED Provider Notes (Signed)
CSN: [id number]     Arrival date & time 10/04/14  1328 History   First MD Initiated Contact with Patient 10/04/14 1414     Chief Complaint  Patient presents with  . Abdominal Pain     HPI  Julie Ward presents for evaluation of abdominal pain. Her abdominal pain is described as generalized , constant , and "pain". Her pain is better in there for the last 3 days but became more significant since last night. It radiates down to her pelvis. She has had 3 episodes of vomiting today which has been nonbloody. She reports watery and mucousy stools but no diarrhea no hematochezia.  She denies fevers, dysuria, vaginal discharge , new sexual partners. She was diagnosed with Crohn's one year ago and has had multiple abdominal surgeries. She is compliant with her medications. Her Gastroenterologist is in [place].   Past Medical History  Diagnosis Date  . GERD (gastroesophageal reflux disease)   . Allergy   . Dysmenorrhea   . Abdominal pain, recurrent   . Appendicitis   . Crohn's disease of colon 10/14    sees Dr. Blanca Friend at [facility]    Past Surgical History  Procedure Laterality Date  . Appendectomy  07-14-11    laparoscopic   . Esophagogastroduodenoscopy  01/28/2012    Procedure: ESOPHAGOGASTRODUODENOSCOPY (EGD);  Surgeon: Oletha Blend, MD;  Location: [place];  Service: Gastroenterology;  Laterality: N/A;  . Wisdom tooth extraction    . Cholecystectomy  02/11/2012    Procedure: LAPAROSCOPIC CHOLECYSTECTOMY WITH INTRAOPERATIVE CHOLANGIOGRAM;  Surgeon: Gwenyth Ober, MD;  Location: [place];  Service: General;  Laterality: N/A;   Family History  Problem Relation Age of Onset  . Cancer Paternal Aunt     ovarian, kidney  . Cancer Paternal Uncle     colon  . Colon cancer Paternal Uncle   . Cancer Maternal Grandmother     breast   . Hypertension Maternal Grandmother   . Irritable bowel syndrome Maternal Grandmother   . Heart disease Maternal Grandmother   . Hypertension Paternal  Grandmother   . Hyperlipidemia Paternal Grandmother    History  Substance Use Topics  . Smoking status: Never Smoker   . Smokeless tobacco: Never Used  . Alcohol Use: No     Comment: none   OB History    Gravida Para Term Preterm AB TAB SAB Ectopic Multiple Living   0              Review of Systems  All other systems reviewed and are negative.     Allergies  Oxycontin; Penicillins; and Prednisone  Home Medications   Prior to Admission medications   Medication Sig Start Date End Date Taking? Authorizing Provider  benzonatate (TESSALON) 100 MG capsule Take 1-2 capsules (100-200 mg total) by mouth 3 (three) times daily as needed for cough. 09/06/14  Yes Burnis Medin, MD  budesonide (ENTOCORT EC) 3 MG 24 hr capsule Take 3 capsules (9 mg total) by mouth every morning. 09/10/13  Yes Sable Feil, MD  clidinium-chlordiazePOXIDE (LIBRAX) 5-2.5 MG per capsule Take 1 capsule by mouth 2 (two) times daily as needed (crohn's disease).  07/11/14  Yes Historical Provider, MD  docusate sodium (STOOL SOFTENER) 100 MG capsule Take 100 mg by mouth daily as needed for mild constipation (constipation).    Yes Historical Provider, MD  esomeprazole (NEXIUM) 40 MG capsule Take 40 mg by mouth daily at 12 noon.   Yes Historical Provider, MD  guaiFENesin (ROBITUSSIN) 100 MG/5ML liquid Take 200 mg by mouth 4 (four) times daily as needed for cough (cough).   Yes Historical Provider, MD  potassium chloride (KLOR-CON 10) 10 MEQ tablet Take 1 tablet (10 mEq total) by mouth 2 (two) times daily. Patient taking differently: Take 10 mEq by mouth daily as needed (low potassium).  03/05/14  Yes Laurey Morale, MD  psyllium (REGULOID) 0.52 G capsule Take 1.04 g by mouth daily.    Yes Historical Provider, MD   BP 131/76 mmHg  Pulse 88  Temp(Src) 98.4 F (36.9 C) (Oral)  Resp 16  SpO2 100% Physical Exam  Constitutional: She is oriented to person, place, and time. She appears well-developed and  well-nourished.  HENT:  Head: Normocephalic and atraumatic.  Cardiovascular: Normal rate and regular rhythm.   No murmur heard. Pulmonary/Chest: Effort normal and breath sounds normal. No respiratory distress.  Abdominal:   Abdomen is soft with moderate diffuse tenderness, bowel sounds are normal active, there is no guarding or rebound tenderness  Musculoskeletal: She exhibits no edema or tenderness.  Neurological: She is alert and oriented to person, place, and time.  Skin: Skin is warm and dry.  Psychiatric: She has a normal mood and affect. Her behavior is normal.  Nursing note and vitals reviewed.   ED Course  Procedures (including critical care time) Labs Review Labs Reviewed  COMPREHENSIVE METABOLIC PANEL - Abnormal; Notable for the following:    Sodium 133 (*)    Potassium 3.4 (*)    All other components within normal limits  CBC WITH DIFFERENTIAL - Abnormal; Notable for the following:    WBC 3.8 (*)    Monocytes Relative 15 (*)    All other components within normal limits  URINALYSIS, ROUTINE W REFLEX MICROSCOPIC - Abnormal; Notable for the following:    Color, Urine AMBER (*)    All other components within normal limits  PREGNANCY, URINE  LIPASE, BLOOD    Imaging Review Dg Abd Acute W/chest  10/04/2014   CLINICAL DATA:  Abdominal, back and pelvic pain last 2 days. History of Crohn's disease. Vomiting today.  EXAM: ACUTE ABDOMEN SERIES (ABDOMEN 2 VIEW & CHEST 1 VIEW)  COMPARISON:  04/21/2014  FINDINGS: Lungs are clear. Cardiomediastinal silhouette and remainder of the chest is unchanged.  Abdominal pelvic images demonstrate surgical clips over the right upper quadrant. Bowel gas pattern is nonobstructive. Mild fecal retention over the rectum. Surgical suture line is present over the right lower quadrant. Remaining bones soft tissues are within normal.  IMPRESSION: Nonobstructive bowel gas pattern.  No acute cardiopulmonary disease.   Electronically Signed   By: Marin Olp M.D.   On: 10/04/2014 14:44     EKG Interpretation None      1545.  On repeat exam pt reports that her lower AP is improved but now she has more epigastric pain.  No vomiting in ED.  On exam she has moderate epigastric tenderness without guarding or rebound.   MDM   Final diagnoses:  Generalized abdominal pain  Intractable vomiting with nausea, vomiting of unspecified type    On repeat evaluation patient is partially improved. She is tolerating oral medications and graham crackers. She does have some recurrent epigastric pain with eating. Clinical picture not consistent with serious bacterial infection or bowel obstruction. Patient with multiple prior CT scans of the abdomen, will refrain from checking CT abdomen today. Patient with recurrent vomiting in the emergency department. She declines additional antiemetics at this time. Medicine  consult for admission for abdominal pain and intractable nausea and vomiting.    Quintella Reichert, MD 10/04/14 [phone]

## 2014-10-04 NOTE — ED Notes (Signed)
Pt actively vomiting. Dr aware

## 2014-10-04 NOTE — H&P (Signed)
[facility] History and Physical  Julie Ward TWS:[id number] DOB: [birth year] DOA: 10/04/2014   PCP: Julie Morale, MD  Specialists: She is followed by Dr. Blanca Friend, gastroenterologist at [facility]. Also followed by rheumatology, both here in [place], as well as an [facility] Also followed by Dr. Cruzita Lederer with endocrinology for thyroid nodule  Chief Complaint: Abdominal pain with nausea and vomiting  HPI: Julie Ward is a 18 y.o. female with a past medical history of Crohn's disease diagnosed about a year ago by colonoscopy, left thyroid nodule undergoing evaluation by general surgery and endocrinology, back pain, under evaluation by rheumatology, who was in her usual state of health till about 2-3 days ago when she started developing abdominal pain in the lower abdomen, both sides. She had no fever but had some chills. She tried taking her home medications. However, her symptoms did not improve. The pain was 10 out of 10 in intensity when she came in to the emergency department. The main reason she came in today with was because she started having nausea and vomiting since yesterday. She must thrown up at least 6-7 times. Denies any blood in the emesis. She also has had 5 loose stools last night. The last bowel movement was 11:30 PM. Denies any blood in the stool. Mucus was present. Denies any urinary complaints. No vaginal discharge or bleeding.  Home Medications: Prior to Admission medications   Medication Sig Start Date End Date Taking? Authorizing Provider  benzonatate (TESSALON) 100 MG capsule Take 1-2 capsules (100-200 mg total) by mouth 3 (three) times daily as needed for cough. 09/06/14  Yes Burnis Medin, MD  budesonide (ENTOCORT EC) 3 MG 24 hr capsule Take 3 capsules (9 mg total) by mouth every morning. 09/10/13  Yes Sable Feil, MD  clidinium-chlordiazePOXIDE (LIBRAX) 5-2.5 MG per capsule Take 1 capsule by mouth 2 (two) times daily  as needed (crohn's disease).  07/11/14  Yes Historical Provider, MD  docusate sodium (STOOL SOFTENER) 100 MG capsule Take 100 mg by mouth daily as needed for mild constipation (constipation).    Yes Historical Provider, MD  esomeprazole (NEXIUM) 40 MG capsule Take 40 mg by mouth daily at 12 noon.   Yes Historical Provider, MD  guaiFENesin (ROBITUSSIN) 100 MG/5ML liquid Take 200 mg by mouth 4 (four) times daily as needed for cough (cough).   Yes Historical Provider, MD  potassium chloride (KLOR-CON 10) 10 MEQ tablet Take 1 tablet (10 mEq total) by mouth 2 (two) times daily. Patient taking differently: Take 10 mEq by mouth daily as needed (low potassium).  03/05/14  Yes Julie Morale, MD  psyllium (REGULOID) 0.52 G capsule Take 1.04 g by mouth daily.    Yes Historical Provider, MD    Allergies:  Allergies  Allergen Reactions  . Oxycontin [Oxycodone Hcl] Nausea And Vomiting    vomiting  . Penicillins Nausea Only and Other (See Comments)    Made her sick  . Prednisone Hives    Past Medical History: Past Medical History  Diagnosis Date  . GERD (gastroesophageal reflux disease)   . Allergy   . Dysmenorrhea   . Abdominal pain, recurrent   . Appendicitis   . Crohn's disease of colon 10/14    sees Dr. Blanca Friend at [facility]     Past Surgical History  Procedure Laterality Date  . Appendectomy  07-14-11    laparoscopic   . Esophagogastroduodenoscopy  01/28/2012    Procedure: ESOPHAGOGASTRODUODENOSCOPY (EGD);  Surgeon:  Oletha Blend, MD;  Location: [place];  Service: Gastroenterology;  Laterality: N/A;  . Wisdom tooth extraction    . Cholecystectomy  02/11/2012    Procedure: LAPAROSCOPIC CHOLECYSTECTOMY WITH INTRAOPERATIVE CHOLANGIOGRAM;  Surgeon: Gwenyth Ober, MD;  Location: [place];  Service: General;  Laterality: N/A;    Social History: She lives in [place] with her mother. She is homosexual and does not have sexual relationships with men. She denies smoking, alcohol use on a  regular basis or recreational drug use. Quite independent with daily activities.  Family History:  Family History  Problem Relation Age of Onset  . Cancer Paternal Aunt     ovarian, kidney  . Cancer Paternal Uncle     colon  . Colon cancer Paternal Uncle   . Cancer Maternal Grandmother     breast   . Hypertension Maternal Grandmother   . Irritable bowel syndrome Maternal Grandmother   . Heart disease Maternal Grandmother   . Hypertension Paternal Grandmother   . Hyperlipidemia Paternal Grandmother      Review of Systems - History obtained from the patient General ROS: positive for  - chills Psychological ROS: positive for - anxiety Ophthalmic ROS: negative ENT ROS: negative Allergy and Immunology ROS: negative Hematological and Lymphatic ROS: negative Endocrine ROS: negative Respiratory ROS: no cough, shortness of breath, or wheezing Cardiovascular ROS: no chest pain or dyspnea on exertion Gastrointestinal ROS: as in hpi Genito-Urinary ROS: no dysuria, trouble voiding, or hematuria Musculoskeletal ROS: negative Neurological ROS: no TIA or stroke symptoms Dermatological ROS: negative  Physical Examination  Filed Vitals:   10/04/14 1343 10/04/14 1641  BP: 131/76 109/69  Pulse: 88 107  Temp: 98.4 F (36.9 C)   TempSrc: Oral Other (Comment)  Resp: 16 16  SpO2: 100% 100%    BP 109/69 mmHg  Pulse 107  Temp(Src) 98.4 F (36.9 C) (Oral)  Resp 16  SpO2 100%  LMP 09/08/2014  General appearance: alert, cooperative, appears stated age and no distress Head: Normocephalic, without obvious abnormality, atraumatic Eyes: conjunctivae/corneas clear. PERRL, EOM's intact. Fundi benign. Throat: Slightly dry mucous membranes. Neck: no adenopathy, no carotid bruit, no JVD, supple, symmetrical, trachea midline and thyroid: nodular Resp: clear to auscultation bilaterally Cardio: regular rate and rhythm, S1, S2 normal, no murmur, click, rub or gallop GI: Abdomen is soft. Mild  tenderness diffusely without any rebound, rigidity or guarding. No masses, organomegaly. Bowel sounds are present. Extremities: extremities normal, atraumatic, no cyanosis or edema Pulses: 2+ and symmetric Skin: Scattered bruising is noted. Lymph nodes: Cervical, supraclavicular, and axillary nodes normal. Neurologic: Alert and oriented 3. Cranial nerves II-12 intact. Motor strength is 5 out of 5 bilateral upper and lower extremities.  Laboratory Data: Results for orders placed or performed during the hospital encounter of 10/04/14 (from the past 48 hour(s))  Urinalysis, Routine w reflex microscopic     Status: Abnormal   Collection Time: 10/04/14  2:23 PM  Result Value Ref Range   Color, Urine AMBER (A) YELLOW    Comment: BIOCHEMICALS MAY BE AFFECTED BY COLOR   APPearance CLEAR CLEAR   Specific Gravity, Urine 1.026 1.005 - 1.030   pH 5.5 5.0 - 8.0   Glucose, UA NEGATIVE NEGATIVE mg/dL   Hgb urine dipstick NEGATIVE NEGATIVE   Bilirubin Urine NEGATIVE NEGATIVE   Ketones, ur NEGATIVE NEGATIVE mg/dL   Protein, ur NEGATIVE NEGATIVE mg/dL   Urobilinogen, UA 1.0 0.0 - 1.0 mg/dL   Nitrite NEGATIVE NEGATIVE   Leukocytes, UA NEGATIVE NEGATIVE  Comment: MICROSCOPIC NOT DONE ON URINES WITH NEGATIVE PROTEIN, BLOOD, LEUKOCYTES, NITRITE, OR GLUCOSE <1000 mg/dL.  Pregnancy, urine     Status: None   Collection Time: 10/04/14  2:23 PM  Result Value Ref Range   Preg Test, Ur NEGATIVE NEGATIVE    Comment:        THE SENSITIVITY OF THIS METHODOLOGY IS >20 mIU/mL.   Comprehensive metabolic panel     Status: Abnormal   Collection Time: 10/04/14  2:49 PM  Result Value Ref Range   Sodium 133 (L) 137 - 147 mEq/L   Potassium 3.4 (L) 3.7 - 5.3 mEq/L   Chloride 97 96 - 112 mEq/L   CO2 23 19 - 32 mEq/L   Glucose, Bld 84 70 - 99 mg/dL   BUN 10 6 - 23 mg/dL   Creatinine, Ser 0.67 0.50 - 1.10 mg/dL   Calcium 9.4 8.4 - 10.5 mg/dL   Total Protein 8.3 6.0 - 8.3 g/dL   Albumin 4.0 3.5 - 5.2 g/dL   AST  15 0 - 37 U/L   ALT 6 0 - 35 U/L   Alkaline Phosphatase 61 39 - 117 U/L   Total Bilirubin 1.0 0.3 - 1.2 mg/dL   GFR calc non Af Amer >90 >90 mL/min   GFR calc Af Amer >90 >90 mL/min    Comment: (NOTE) The eGFR has been calculated using the CKD EPI equation. This calculation has not been validated in all clinical situations. eGFR's persistently <90 mL/min signify possible Chronic Kidney Disease.    Anion gap 13 5 - 15  CBC with Differential     Status: Abnormal   Collection Time: 10/04/14  2:49 PM  Result Value Ref Range   WBC 3.8 (L) 4.0 - 10.5 K/uL   RBC 4.60 3.87 - 5.11 MIL/uL   Hemoglobin 14.0 12.0 - 15.0 g/dL   HCT 41.1 36.0 - 46.0 %   MCV 89.3 78.0 - 100.0 fL   MCH 30.4 26.0 - 34.0 pg   MCHC 34.1 30.0 - 36.0 g/dL   RDW 12.9 11.5 - 15.5 %   Platelets 234 150 - 400 K/uL   Neutrophils Relative % 60 43 - 77 %   Neutro Abs 2.3 1.7 - 7.7 K/uL   Lymphocytes Relative 24 12 - 46 %   Lymphs Abs 0.9 0.7 - 4.0 K/uL   Monocytes Relative 15 (H) 3 - 12 %   Monocytes Absolute 0.6 0.1 - 1.0 K/uL   Eosinophils Relative 1 0 - 5 %   Eosinophils Absolute 0.0 0.0 - 0.7 K/uL   Basophils Relative 0 0 - 1 %   Basophils Absolute 0.0 0.0 - 0.1 K/uL  Lipase, blood     Status: None   Collection Time: 10/04/14  2:49 PM  Result Value Ref Range   Lipase 25 11 - 59 U/L    Radiology Reports: Dg Abd Acute W/chest  10/04/2014   CLINICAL DATA:  Abdominal, back and pelvic pain last 2 days. History of Crohn's disease. Vomiting today.  EXAM: ACUTE ABDOMEN SERIES (ABDOMEN 2 VIEW & CHEST 1 VIEW)  COMPARISON:  04/21/2014  FINDINGS: Lungs are clear. Cardiomediastinal silhouette and remainder of the chest is unchanged.  Abdominal pelvic images demonstrate surgical clips over the right upper quadrant. Bowel gas pattern is nonobstructive. Mild fecal retention over the rectum. Surgical suture line is present over the right lower quadrant. Remaining bones soft tissues are within normal.  IMPRESSION: Nonobstructive  bowel gas pattern.  No acute cardiopulmonary disease.  Electronically Signed   By: Marin Olp M.D.   On: 10/04/2014 14:44     Problem List  Principal Problem:   Nausea and vomiting Active Problems:   Crohn's disease   Thyroid nodule   Abdominal pain   Assessment: This is a 18 year old African-American female who presents to the hospital with complaints of nausea, vomiting and abdominal pain. She tells me that this feels like a Crohn's exacerbation. Her WBC is normal. There's been no reports of fever. No other concerning findings on lab work or on the acute abdominal series. Gastroenteritis is another possibility.  Plan: #1 Nausea, vomiting, with abdominal pain: Possible Crohn's exacerbation. She has an appointment with her gastroenterologist at [facility] on Monday. I think it is reasonable to observe her here. Give her symptomatic treatment. We will also give her intravenous Solu-Medrol, which may help as well. She's had multiple CT scans over the last year or so. The last one was in May. Her abdominal examination is not that concerning. So at this time, there is no need to pursue a CT scan. Keep her on clear liquids for now. As her symptoms improve her diet can be advanced. Hopefully, she can be discharged over the weekend so that she can follow-up with her doctor at [facility] on Monday.  #2 History of Crohn's disease: As above. Continue with her home medications.  #3 Left thyroid nodule: She is followed by an endocrinologist and plans are for surgical removal in the near future.  #4 history of back pain: She is followed by an rheumatologist. Not an active issue currently.   DVT Prophylaxis: Lovenox Code Status: Full code Family Communication: Discussed with the patient, her mother, father  Disposition Plan: Observe to MedSurg   Further management decisions will depend on results of further testing and patient's response to treatment.   [facility]  [facility] Pager  [phone]  If 7PM-7AM, please contact night-coverage [email or web address] Password [facility]  10/04/2014, 5:38 PM

## 2014-10-05 DIAGNOSIS — E876 Hypokalemia: Secondary | ICD-10-CM

## 2014-10-05 DIAGNOSIS — R1115 Cyclical vomiting syndrome unrelated to migraine: Secondary | ICD-10-CM | POA: Diagnosis present

## 2014-10-05 DIAGNOSIS — R945 Abnormal results of liver function studies: Secondary | ICD-10-CM

## 2014-10-05 DIAGNOSIS — E041 Nontoxic single thyroid nodule: Secondary | ICD-10-CM | POA: Diagnosis present

## 2014-10-05 DIAGNOSIS — Z888 Allergy status to other drugs, medicaments and biological substances status: Secondary | ICD-10-CM | POA: Diagnosis not present

## 2014-10-05 DIAGNOSIS — R7989 Other specified abnormal findings of blood chemistry: Secondary | ICD-10-CM | POA: Diagnosis present

## 2014-10-05 DIAGNOSIS — K219 Gastro-esophageal reflux disease without esophagitis: Secondary | ICD-10-CM | POA: Diagnosis present

## 2014-10-05 DIAGNOSIS — R1110 Vomiting, unspecified: Secondary | ICD-10-CM | POA: Diagnosis present

## 2014-10-05 DIAGNOSIS — K5090 Crohn's disease, unspecified, without complications: Secondary | ICD-10-CM | POA: Diagnosis present

## 2014-10-05 DIAGNOSIS — Z880 Allergy status to penicillin: Secondary | ICD-10-CM | POA: Diagnosis not present

## 2014-10-05 DIAGNOSIS — R1084 Generalized abdominal pain: Secondary | ICD-10-CM | POA: Diagnosis present

## 2014-10-05 LAB — COMPREHENSIVE METABOLIC PANEL
ALBUMIN: 3.2 g/dL — AB (ref 3.5–5.2)
ALK PHOS: 84 U/L (ref 39–117)
ALT: 111 U/L — AB (ref 0–35)
ANION GAP: 13 (ref 5–15)
AST: 129 U/L — ABNORMAL HIGH (ref 0–37)
BILIRUBIN TOTAL: 1 mg/dL (ref 0.3–1.2)
BUN: 7 mg/dL (ref 6–23)
CHLORIDE: 98 meq/L (ref 96–112)
CO2: 21 mEq/L (ref 19–32)
Calcium: 8.8 mg/dL (ref 8.4–10.5)
Creatinine, Ser: 0.59 mg/dL (ref 0.50–1.10)
GFR calc Af Amer: >90 mL/min (ref >90)
GFR calc non Af Amer: >90 mL/min (ref >90)
Glucose, Bld: 92 mg/dL (ref 70–99)
POTASSIUM: 3.5 meq/L — AB (ref 3.7–5.3)
SODIUM: 132 meq/L — AB (ref 137–147)
Total Protein: 6.8 g/dL (ref 6.0–8.3)

## 2014-10-05 LAB — CBC
HCT: 34.8 % — ABNORMAL LOW (ref 36.0–46.0)
HEMOGLOBIN: 11.7 g/dL — AB (ref 12.0–15.0)
MCH: 30 pg (ref 26.0–34.0)
MCHC: 33.6 g/dL (ref 30.0–36.0)
MCV: 89.2 fL (ref 78.0–100.0)
Platelets: 203 10*3/uL (ref 150–400)
RBC: 3.9 MIL/uL (ref 3.87–5.11)
RDW: 12.9 % (ref 11.5–15.5)
WBC: 3.3 10*3/uL — ABNORMAL LOW (ref 4.0–10.5)

## 2014-10-05 LAB — PROTIME-INR
INR: 1.21 (ref 0.00–1.49)
Prothrombin Time: 15.5 seconds — ABNORMAL HIGH (ref 11.6–15.2)

## 2014-10-05 LAB — APTT: aPTT: 34 seconds (ref 24–37)

## 2014-10-05 LAB — MAGNESIUM: Magnesium: 1.7 mg/dL (ref 1.5–2.5)

## 2014-10-05 MED ORDER — METHYLPREDNISOLONE SODIUM SUCC 40 MG IJ SOLR
40.0000 mg | Freq: Two times a day (BID) | INTRAMUSCULAR | Status: DC
  Administered 2014-10-05: 40 mg via INTRAVENOUS
  Filled 2014-10-05 (×2): qty 1

## 2014-10-05 NOTE — Progress Notes (Signed)
Progress Note   CHRISTIAN TREADWAY JKQ:[id number] DOB: 02-04-1996 DOA: 10/04/2014 PCP: Laurey Morale, MD   Brief Narrative:   Julie Ward is an 18 y.o. female with PMH of Crohn's disease who was admitted 10/04/14 with nausea, vomiting and abdominal pain as well as diarrhea.  Assessment/Plan:   Principal Problem:   Nausea and vomiting, diarrhea and abdominal pain in the patient with PMH of Crohn's disease  Admitted for observation.  Continue Solu-Medrol, Entocort.  Bowel rest with clear liquid diet. Advance as tolerated.  Has scheduled follow-up with gastroenterologist at [facility] 10/07/14.  Active Problems:   Thyroid nodule  Follow-up with endocrinology. Surgical removal planned in near future.    DVT Prophylaxis  Lovenox ordered.  Code Status: Full. Family Communication: Visitor in room, but was asleep. Disposition Plan: Home when stable.   IV Access:    Peripheral IV   Procedures and diagnostic studies:   Dg Abd Acute W/chest 10/04/2014: Nonobstructive bowel gas pattern.  No acute cardiopulmonary disease.     Medical Consultants:    None.  Anti-Infectives:    None.  Subjective:   [name] still feels nauseated, but has not vomited or had any diarrhea over night.  Does not want to advance diet yet.  Objective:    Filed Vitals:   10/04/14 1343 10/04/14 1641 10/04/14 1920 10/04/14 2101  BP: 131/76 109/69 114/56 116/61  Pulse: 88 107 72 71  Temp: 98.4 F (36.9 C)  98.2 F (36.8 C) 98 F (36.7 C)  TempSrc: Oral Other (Comment) Oral Oral  Resp: 16 16 18 20   Height:   5' 7"  (1.702 m)   Weight:   80.468 kg (177 lb 6.4 oz)   SpO2: 100% 100% 100% 100%    Intake/Output Summary (Last 24 hours) at 10/05/14 0758 Last data filed at 10/05/14 0600  Gross per 24 hour  Intake   1050 ml  Output      0 ml  Net   1050 ml    Exam: Gen:  NAD Cardiovascular:  RRR, No M/R/G Respiratory:  Lungs CTAB Gastrointestinal:  Abdomen  soft, diffusely tender, + BS Extremities:  No C/E/C   Data Reviewed:    Labs: Basic Metabolic Panel:  Recent Labs Lab 10/04/14 1449 10/05/14 0352  NA 133* 132*  K 3.4* 3.5*  CL 97 98  CO2 23 21  GLUCOSE 84 92  BUN 10 7  CREATININE 0.67 0.59  CALCIUM 9.4 8.8  MG  --  1.7   GFR Estimated Creatinine Clearance: 122.5 mL/min (by C-G formula based on Cr of 0.59). Liver Function Tests:  Recent Labs Lab 10/04/14 1449 10/05/14 0352  AST 15 129*  ALT 6 111*  ALKPHOS 61 84  BILITOT 1.0 1.0  PROT 8.3 6.8  ALBUMIN 4.0 3.2*    Recent Labs Lab 10/04/14 1449  LIPASE 25   Coagulation profile  Recent Labs Lab 10/05/14 0352  INR 1.21    CBC:  Recent Labs Lab 10/04/14 1449 10/05/14 0352  WBC 3.8* 3.3*  NEUTROABS 2.3  --   HGB 14.0 11.7*  HCT 41.1 34.8*  MCV 89.3 89.2  PLT 234 203   Microbiology No results found for this or any previous visit (from the past 240 hour(s)).   Medications:   . budesonide  9 mg Oral Daily  . clidinium-chlordiazePOXIDE  1 capsule Oral TID AC  . enoxaparin (LOVENOX) injection  40 mg Subcutaneous Q24H  . pantoprazole  40 mg Oral  Daily   Continuous Infusions: . sodium chloride 100 mL/hr at 10/04/14 2030    Time spent: 25 minutes.   LOS: 1 day   Ivon Oelkers  [facility] Pager [phone]. If unable to reach me by pager, please call my cell phone at [phone].  *Please refer to [email or web address], password [email or web address] to get updated schedule on who will round on this patient, as hospitalists switch teams weekly. If 7PM-7AM, please contact night-coverage at [email or web address], password [email or web address] for any overnight needs.  10/05/2014, 7:58 AM

## 2014-10-05 NOTE — Discharge Summary (Signed)
Physician Discharge Summary  Julie Ward GGY:[id number] DOB: 1996/10/16 DOA: 10/04/2014  PCP: Laurey Morale, MD  Admit date: 10/04/2014 Discharge date: 10/05/2014   Recommendations for Outpatient Follow-Up:   1. The patient has follow-up with her gastroenterologist scheduled on 10/07/14. 2. Recommend follow up of mildly elevated LFTs.   Discharge Diagnosis:    Principal Problem:   Nausea and vomiting secondary to Crohn's flare Active Problems:    Crohn's disease    Thyroid nodule    Abdominal pain    Vomiting    Elevated LFTs    Hypokalemia   Discharge Condition: Improved.  Diet recommendation: Bland.   History of Present Illness:   Julie Ward is an 18 y.o. female with PMH of Crohn's disease who was admitted 10/04/14 with nausea, vomiting and abdominal pain as well as diarrhea.  Hospital Course by Problem:   Principal Problem:  Nausea and vomiting, diarrhea and abdominal pain in the patient with PMH of Crohn's disease  Admitted for observation.  Treated with Solu-Medrol, Entocort with rapid resolution of her symptoms.  Diet advanced prior to discharge with no return of nausea/vomiting.                                                               Has scheduled follow-up with gastroenterologist at [facility] 10/07/14.  Active Problems:   Hypokalemia  Potassium repleted prior to discharge.   Thyroid nodule  Follow-up with endocrinology. Surgical removal planned in near future.    Medical Consultants:    None.   Discharge Exam:   Filed Vitals:   10/04/14 2101  BP: 116/61  Pulse: 71  Temp: 98 F (36.7 C)  Resp: 20   Filed Vitals:   10/04/14 1343 10/04/14 1641 10/04/14 1920 10/04/14 2101  BP: 131/76 109/69 114/56 116/61  Pulse: 88 107 72 71  Temp: 98.4 F (36.9 C)  98.2 F (36.8 C) 98 F (36.7 C)  TempSrc: Oral Other (Comment) Oral Oral  Resp: 16 16 18 20   Height:   5' 7"  (1.702 m)   Weight:   80.468 kg (177  lb 6.4 oz)   SpO2: 100% 100% 100% 100%    Gen:  NAD Cardiovascular:  RRR, No M/R/G Respiratory: Lungs CTAB Gastrointestinal: Abdomen soft, NT/ND with normal active bowel sounds. Extremities: No C/E/C   The results of significant diagnostics from this hospitalization (including imaging, microbiology, ancillary and laboratory) are listed below for reference.     Procedures and Diagnostic Studies:   Dg Abd Acute W/chest 10/04/2014: Nonobstructive bowel gas pattern. No acute cardiopulmonary disease.   Labs:   Basic Metabolic Panel:  Recent Labs Lab 10/04/14 1449 10/05/14 0352  NA 133* 132*  K 3.4* 3.5*  CL 97 98  CO2 23 21  GLUCOSE 84 92  BUN 10 7  CREATININE 0.67 0.59  CALCIUM 9.4 8.8  MG  --  1.7   GFR Estimated Creatinine Clearance: 122.5 mL/min (by C-G formula based on Cr of 0.59). Liver Function Tests:  Recent Labs Lab 10/04/14 1449 10/05/14 0352  AST 15 129*  ALT 6 111*  ALKPHOS 61 84  BILITOT 1.0 1.0  PROT 8.3 6.8  ALBUMIN 4.0 3.2*    Recent Labs Lab 10/04/14 1449  LIPASE 25   Coagulation profile  Recent Labs Lab 10/05/14 0352  INR 1.21    CBC:  Recent Labs Lab 10/04/14 1449 10/05/14 0352  WBC 3.8* 3.3*  NEUTROABS 2.3  --   HGB 14.0 11.7*  HCT 41.1 34.8*  MCV 89.3 89.2  PLT 234 203     Discharge Instructions:   Discharge Instructions    Call MD for:  persistant nausea and vomiting    Complete by:  As directed      Call MD for:  severe uncontrolled pain    Complete by:  As directed      Call MD for:  temperature >100.4    Complete by:  As directed      Diet general    Complete by:  As directed   Bland diet     Discharge instructions    Complete by:  As directed   You were cared for by Dr. Jacquelynn Cree  (a hospitalist) during your hospital stay. If you have any questions about your discharge medications or the care you received while you were in the hospital after you are discharged, you can call the unit and ask to  speak with the hospitalist on call if the hospitalist that took care of you is not available. Once you are discharged, your primary care physician will handle any further medical issues. Please note that NO REFILLS for any discharge medications will be authorized once you are discharged, as it is imperative that you return to your primary care physician (or establish a relationship with a primary care physician if you do not have one) for your aftercare needs so that they can reassess your need for medications and monitor your lab values.  Any outstanding tests can be reviewed by your PCP at your follow up visit.  It is also important to review any medicine changes with your PCP.  Please bring these d/c instructions with you to your next visit so your physician can review these changes with you.     Increase activity slowly    Complete by:  As directed      Other Restrictions    Complete by:  As directed   May return to work on 10/07/14.  Note: Please excuse Julie Ward from work on 10/06/14.            Medication List    TAKE these medications        benzonatate 100 MG capsule  Commonly known as:  TESSALON  Take 1-2 capsules (100-200 mg total) by mouth 3 (three) times daily as needed for cough.     budesonide 3 MG 24 hr capsule  Commonly known as:  ENTOCORT EC  Take 3 capsules (9 mg total) by mouth every morning.     clidinium-chlordiazePOXIDE 5-2.5 MG per capsule  Commonly known as:  LIBRAX  Take 1 capsule by mouth 2 (two) times daily as needed (crohn's disease).     esomeprazole 40 MG capsule  Commonly known as:  NEXIUM  Take 40 mg by mouth daily at 12 noon.     guaiFENesin 100 MG/5ML liquid  Commonly known as:  ROBITUSSIN  Take 200 mg by mouth 4 (four) times daily as needed for cough (cough).     potassium chloride 10 MEQ tablet  Commonly known as:  KLOR-CON 10  Take 1 tablet (10 mEq total) by mouth 2 (two) times daily.     psyllium 0.52 G capsule  Commonly known as:   REGULOID  Take 1.04 g by mouth daily.  STOOL SOFTENER 100 MG capsule  Generic drug:  docusate sodium  Take 100 mg by mouth daily as needed for mild constipation (constipation).           Follow-up Information    Follow up with Gastroenterology In 3 days.      Follow up with Laurey Morale, MD.   Specialty:  Family Medicine   Why:  If symptoms worsen   Contact information:   [address] [place] [address] [phone]        Time coordinating discharge: 25 minutes.  Signed:  RAMA,CHRISTINA  Pager [phone] [facility] 10/05/2014, 2:13 PM

## 2014-10-05 NOTE — Progress Notes (Signed)
Utilization Review completed.  

## 2014-11-12 ENCOUNTER — Emergency Department (HOSPITAL_COMMUNITY)
Admission: EM | Admit: 2014-11-12 | Discharge: 2014-11-12 | Disposition: A | Discharge status: Home / Self Care | Payer: BC Managed Care – PPO | Attending: Emergency Medicine | Admitting: Emergency Medicine

## 2014-11-12 ENCOUNTER — Emergency Department (HOSPITAL_COMMUNITY): Payer: BC Managed Care – PPO

## 2014-11-12 ENCOUNTER — Encounter (HOSPITAL_COMMUNITY): Payer: Self-pay | Admitting: Emergency Medicine

## 2014-11-12 DIAGNOSIS — Z3202 Encounter for pregnancy test, result negative: Secondary | ICD-10-CM | POA: Insufficient documentation

## 2014-11-12 DIAGNOSIS — Z8742 Personal history of other diseases of the female genital tract: Secondary | ICD-10-CM | POA: Insufficient documentation

## 2014-11-12 DIAGNOSIS — Z880 Allergy status to penicillin: Secondary | ICD-10-CM | POA: Insufficient documentation

## 2014-11-12 DIAGNOSIS — K219 Gastro-esophageal reflux disease without esophagitis: Secondary | ICD-10-CM | POA: Diagnosis not present

## 2014-11-12 DIAGNOSIS — R1084 Generalized abdominal pain: Secondary | ICD-10-CM | POA: Diagnosis not present

## 2014-11-12 DIAGNOSIS — Z79899 Other long term (current) drug therapy: Secondary | ICD-10-CM | POA: Diagnosis not present

## 2014-11-12 DIAGNOSIS — Z9049 Acquired absence of other specified parts of digestive tract: Secondary | ICD-10-CM | POA: Insufficient documentation

## 2014-11-12 DIAGNOSIS — K5090 Crohn's disease, unspecified, without complications: Secondary | ICD-10-CM | POA: Insufficient documentation

## 2014-11-12 DIAGNOSIS — R52 Pain, unspecified: Secondary | ICD-10-CM

## 2014-11-12 LAB — URINALYSIS, ROUTINE W REFLEX MICROSCOPIC
Bilirubin Urine: NEGATIVE
GLUCOSE, UA: NEGATIVE mg/dL
HGB URINE DIPSTICK: NEGATIVE
Ketones, ur: NEGATIVE mg/dL
Leukocytes, UA: NEGATIVE
Nitrite: NEGATIVE
PH: 8.5 — AB (ref 5.0–8.0)
PROTEIN: NEGATIVE mg/dL
Specific Gravity, Urine: 1.024 (ref 1.005–1.030)
Urobilinogen, UA: 1 mg/dL (ref 0.0–1.0)

## 2014-11-12 LAB — COMPREHENSIVE METABOLIC PANEL
ALBUMIN: 4.1 g/dL (ref 3.5–5.2)
ALK PHOS: 55 U/L (ref 39–117)
ALT: 8 U/L (ref 0–35)
AST: 15 U/L (ref 0–37)
Anion gap: 7 (ref 5–15)
BUN: 10 mg/dL (ref 6–23)
CHLORIDE: 106 meq/L (ref 96–112)
CO2: 26 mmol/L (ref 19–32)
Calcium: 9.1 mg/dL (ref 8.4–10.5)
Creatinine, Ser: 0.69 mg/dL (ref 0.50–1.10)
GFR calc Af Amer: >90 mL/min (ref >90)
Glucose, Bld: 87 mg/dL (ref 70–99)
Potassium: 3.5 mmol/L (ref 3.5–5.1)
SODIUM: 139 mmol/L (ref 135–145)
Total Bilirubin: 0.6 mg/dL (ref 0.3–1.2)
Total Protein: 8.2 g/dL (ref 6.0–8.3)

## 2014-11-12 LAB — CBC WITH DIFFERENTIAL/PLATELET
Basophils Absolute: 0 10*3/uL (ref 0.0–0.1)
Basophils Relative: 0 % (ref 0–1)
EOS ABS: 0 10*3/uL (ref 0.0–0.7)
Eosinophils Relative: 0 % (ref 0–5)
HEMATOCRIT: 40.1 % (ref 36.0–46.0)
Hemoglobin: 13 g/dL (ref 12.0–15.0)
LYMPHS PCT: 32 % (ref 12–46)
Lymphs Abs: 1.4 10*3/uL (ref 0.7–4.0)
MCH: 29.8 pg (ref 26.0–34.0)
MCHC: 32.4 g/dL (ref 30.0–36.0)
MCV: 92 fL (ref 78.0–100.0)
MONO ABS: 0.4 10*3/uL (ref 0.1–1.0)
Monocytes Relative: 10 % (ref 3–12)
Neutro Abs: 2.6 10*3/uL (ref 1.7–7.7)
Neutrophils Relative %: 58 % (ref 43–77)
Platelets: 253 10*3/uL (ref 150–400)
RBC: 4.36 MIL/uL (ref 3.87–5.11)
RDW: 12.6 % (ref 11.5–15.5)
WBC: 4.4 10*3/uL (ref 4.0–10.5)

## 2014-11-12 LAB — LIPASE, BLOOD: Lipase: 29 U/L (ref 11–59)

## 2014-11-12 LAB — POC URINE PREG, ED: PREG TEST UR: NEGATIVE

## 2014-11-12 MED ORDER — POTASSIUM CHLORIDE CRYS ER 20 MEQ PO TBCR
40.0000 meq | EXTENDED_RELEASE_TABLET | Freq: Once | ORAL | Status: DC
  Filled 2014-11-12: qty 2

## 2014-11-12 MED ORDER — HYDROMORPHONE HCL 1 MG/ML IJ SOLN
1.0000 mg | Freq: Once | INTRAMUSCULAR | Status: AC
  Administered 2014-11-12: 1 mg via INTRAVENOUS
  Filled 2014-11-12: qty 1

## 2014-11-12 MED ORDER — ONDANSETRON HCL 4 MG/2ML IJ SOLN
4.0000 mg | Freq: Once | INTRAMUSCULAR | Status: AC
  Administered 2014-11-12: 4 mg via INTRAVENOUS
  Filled 2014-11-12: qty 2

## 2014-11-12 NOTE — ED Provider Notes (Signed)
CSN: [id number]     Arrival date & time 11/12/14  0034 History   First MD Initiated Contact with Patient 11/12/14 0239     Chief Complaint  Patient presents with  . Crohn's Disease     (Consider location/radiation/quality/duration/timing/severity/associated sxs/prior Treatment) HPI Complains of diffuse crampy abdominal pain onset approximately 12 hours ago. Feels like Crohn's flareup she's had in the past. No treatment prior to coming here. She's vomited 3 times since onset of pain no hematemesis no fever. Last bowel movement tonight, loose, mixed with mucus, no blood per rectum. No fever no other associated symptoms. Pain is worse with sitting up improved with lying supine. Past Medical History  Diagnosis Date  . GERD (gastroesophageal reflux disease)   . Allergy   . Dysmenorrhea   . Abdominal pain, recurrent   . Appendicitis   . Crohn's disease of colon 10/14    sees Dr. Blanca Friend at [facility] GI    Past Surgical History  Procedure Laterality Date  . Appendectomy  07-14-11    laparoscopic   . Esophagogastroduodenoscopy  01/28/2012    Procedure: ESOPHAGOGASTRODUODENOSCOPY (EGD);  Surgeon: Oletha Blend, MD;  Location: [place];  Service: Gastroenterology;  Laterality: N/A;  . Wisdom tooth extraction    . Cholecystectomy  02/11/2012    Procedure: LAPAROSCOPIC CHOLECYSTECTOMY WITH INTRAOPERATIVE CHOLANGIOGRAM;  Surgeon: Gwenyth Ober, MD;  Location: [place];  Service: General;  Laterality: N/A;   Family History  Problem Relation Age of Onset  . Cancer Paternal Aunt     ovarian, kidney  . Cancer Paternal Uncle     colon  . Colon cancer Paternal Uncle   . Cancer Maternal Grandmother     breast   . Hypertension Maternal Grandmother   . Irritable bowel syndrome Maternal Grandmother   . Heart disease Maternal Grandmother   . Hypertension Paternal Grandmother   . Hyperlipidemia Paternal Grandmother    History  Substance Use Topics  . Smoking status: Never Smoker   . Smokeless  tobacco: Never Used  . Alcohol Use: No     Comment: none   OB History    Gravida Para Term Preterm AB TAB SAB Ectopic Multiple Living   0              Review of Systems  Gastrointestinal: Positive for nausea, vomiting and abdominal pain.  All other systems reviewed and are negative.     Allergies  Oxycontin; Penicillins; and Prednisone  Home Medications   Prior to Admission medications   Medication Sig Start Date End Date Taking? Authorizing Provider  benzonatate (TESSALON) 100 MG capsule Take 1-2 capsules (100-200 mg total) by mouth 3 (three) times daily as needed for cough. 09/06/14   Burnis Medin, MD  budesonide (ENTOCORT EC) 3 MG 24 hr capsule Take 3 capsules (9 mg total) by mouth every morning. 09/10/13   Sable Feil, MD  clidinium-chlordiazePOXIDE (LIBRAX) 5-2.5 MG per capsule Take 1 capsule by mouth 2 (two) times daily as needed (crohn's disease).  07/11/14   Historical Provider, MD  docusate sodium (STOOL SOFTENER) 100 MG capsule Take 100 mg by mouth daily as needed for mild constipation (constipation).     Historical Provider, MD  esomeprazole (NEXIUM) 40 MG capsule Take 40 mg by mouth daily at 12 noon.    Historical Provider, MD  guaiFENesin (ROBITUSSIN) 100 MG/5ML liquid Take 200 mg by mouth 4 (four) times daily as needed for cough (cough).    Historical Provider, MD  potassium  chloride (KLOR-CON 10) 10 MEQ tablet Take 1 tablet (10 mEq total) by mouth 2 (two) times daily. Patient taking differently: Take 10 mEq by mouth daily as needed (low potassium).  03/05/14   Laurey Morale, MD  psyllium (REGULOID) 0.52 G capsule Take 1.04 g by mouth daily.     Historical Provider, MD   BP 117/77 mmHg  Pulse 65  Temp(Src) 98.2 F (36.8 C) (Oral)  Resp 16  SpO2 100%  LMP 11/05/2014 (Exact Date) Physical Exam  Constitutional: She appears well-developed and well-nourished. No distress.  HENT:  Head: Normocephalic and atraumatic.  Eyes: Conjunctivae are normal. Pupils are  equal, round, and reactive to light.  Neck: Neck supple. No tracheal deviation present. No thyromegaly present.  Cardiovascular: Normal rate and regular rhythm.   No murmur heard. Pulmonary/Chest: Effort normal and breath sounds normal.  Abdominal: Soft. Bowel sounds are normal. She exhibits no distension and no mass. There is tenderness. There is no rebound and no guarding.  Diffusely tender  Musculoskeletal: Normal range of motion. She exhibits no edema or tenderness.  Neurological: She is alert. Coordination normal.  Skin: Skin is warm and dry. No rash noted.  Psychiatric: She has a normal mood and affect.  Nursing note and vitals reviewed.   ED Course  Procedures (including critical care time) Labs Review Labs Reviewed - No data to display  Imaging Review No results found.   EKG Interpretation None     5:50 AM pain and nausea improved and patient feels ready to go home after treatment with intravenous opioids. Pt looks well. X-rays viewed by me Results for orders placed or performed during the hospital encounter of 11/12/14  Urinalysis, Routine w reflex microscopic  Result Value Ref Range   Color, Urine YELLOW YELLOW   APPearance CLOUDY (A) CLEAR   Specific Gravity, Urine 1.024 1.005 - 1.030   pH 8.5 (H) 5.0 - 8.0   Glucose, UA NEGATIVE NEGATIVE mg/dL   Hgb urine dipstick NEGATIVE NEGATIVE   Bilirubin Urine NEGATIVE NEGATIVE   Ketones, ur NEGATIVE NEGATIVE mg/dL   Protein, ur NEGATIVE NEGATIVE mg/dL   Urobilinogen, UA 1.0 0.0 - 1.0 mg/dL   Nitrite NEGATIVE NEGATIVE   Leukocytes, UA NEGATIVE NEGATIVE  Comprehensive metabolic panel  Result Value Ref Range   Sodium 139 135 - 145 mmol/L   Potassium 3.5 3.5 - 5.1 mmol/L   Chloride 106 96 - 112 mEq/L   CO2 26 19 - 32 mmol/L   Glucose, Bld 87 70 - 99 mg/dL   BUN 10 6 - 23 mg/dL   Creatinine, Ser 0.69 0.50 - 1.10 mg/dL   Calcium 9.1 8.4 - 10.5 mg/dL   Total Protein 8.2 6.0 - 8.3 g/dL   Albumin 4.1 3.5 - 5.2 g/dL    AST 15 0 - 37 U/L   ALT 8 0 - 35 U/L   Alkaline Phosphatase 55 39 - 117 U/L   Total Bilirubin 0.6 0.3 - 1.2 mg/dL   GFR calc non Af Amer >90 >90 mL/min   GFR calc Af Amer >90 >90 mL/min   Anion gap 7 5 - 15  CBC with Differential  Result Value Ref Range   WBC 4.4 4.0 - 10.5 K/uL   RBC 4.36 3.87 - 5.11 MIL/uL   Hemoglobin 13.0 12.0 - 15.0 g/dL   HCT 40.1 36.0 - 46.0 %   MCV 92.0 78.0 - 100.0 fL   MCH 29.8 26.0 - 34.0 pg   MCHC 32.4 30.0 - 36.0  g/dL   RDW 12.6 11.5 - 15.5 %   Platelets 253 150 - 400 K/uL   Neutrophils Relative % 58 43 - 77 %   Neutro Abs 2.6 1.7 - 7.7 K/uL   Lymphocytes Relative 32 12 - 46 %   Lymphs Abs 1.4 0.7 - 4.0 K/uL   Monocytes Relative 10 3 - 12 %   Monocytes Absolute 0.4 0.1 - 1.0 K/uL   Eosinophils Relative 0 0 - 5 %   Eosinophils Absolute 0.0 0.0 - 0.7 K/uL   Basophils Relative 0 0 - 1 %   Basophils Absolute 0.0 0.0 - 0.1 K/uL  Lipase, blood  Result Value Ref Range   Lipase 29 11 - 59 U/L  POC urine preg, ED (not at [facility])  Result Value Ref Range   Preg Test, Ur NEGATIVE NEGATIVE   Dg Abd Acute W/chest  11/12/2014   CLINICAL DATA:  Mid abdominal pain.  EXAM: ACUTE ABDOMEN SERIES (ABDOMEN 2 VIEW & CHEST 1 VIEW)  COMPARISON:  10/04/2014  FINDINGS: Normal heart size and mediastinal contours. The trachea is deviated to the right, stable from prior, related to a thyroid nodule which was recently biopsied. No acute infiltrate or edema. No effusion or pneumothorax. No acute osseous findings.  No evidence of bowel obstruction or perforation. Cholecystectomy and right lower quadrant bowel surgery changes again noted. No concerning intra-abdominal mass effect or calcification.  IMPRESSION: 1. Negative abdominal radiographs. No acute cardiopulmonary disease. 2. Left thyroid nodule with stable deviation of the trachea.   Electronically Signed   By: Jorje Guild M.D.   On: 11/12/2014 05:46    MDM  Plan clear liquid diet for 24 hours. Follow-up with Dr.Stewart  today, pt's GI spwecialist Diagnoses #1 abdominal pain #2 hypokalemia #3 nausea and vomiting Final diagnoses:  None        Orlie Dakin, MD 11/12/14 [phone]

## 2014-11-12 NOTE — Discharge Instructions (Signed)
Abdominal Pain, Women Call Blanca Friend today to let her know that you had to come to the emergency department for abdominal pain. Stay on clear liquids for the next 24 hours . No driving for the next 24 hours his you were given opioid pain medicine which can cause drowsiness Abdominal (stomach, pelvic, or belly) pain can be caused by many things. It is important to tell your doctor:  The location of the pain.  Does it come and go or is it present all the time?  Are there things that start the pain (eating certain foods, exercise)?  Are there other symptoms associated with the pain (fever, nausea, vomiting, diarrhea)? All of this is helpful to know when trying to find the cause of the pain. CAUSES   Stomach: virus or bacteria infection, or ulcer.  Intestine: appendicitis (inflamed appendix), regional ileitis (Crohn's disease), ulcerative colitis (inflamed colon), irritable bowel syndrome, diverticulitis (inflamed diverticulum of the colon), or cancer of the stomach or intestine.  Gallbladder disease or stones in the gallbladder.  Kidney disease, kidney stones, or infection.  Pancreas infection or cancer.  Fibromyalgia (pain disorder).  Diseases of the female organs:  Uterus: fibroid (non-cancerous) tumors or infection.  Fallopian tubes: infection or tubal pregnancy.  Ovary: cysts or tumors.  Pelvic adhesions (scar tissue).  Endometriosis (uterus lining tissue growing in the pelvis and on the pelvic organs).  Pelvic congestion syndrome (female organs filling up with blood just before the menstrual period).  Pain with the menstrual period.  Pain with ovulation (producing an egg).  Pain with an IUD (intrauterine device, birth control) in the uterus.  Cancer of the female organs.  Functional pain (pain not caused by a disease, may improve without treatment).  Psychological pain.  Depression. DIAGNOSIS  Your doctor will decide the seriousness of your pain by doing an  examination.  Blood tests.  X-rays.  Ultrasound.  CT scan (computed tomography, special type of X-ray).  MRI (magnetic resonance imaging).  Cultures, for infection.  Barium enema (dye inserted in the large intestine, to better view it with X-rays).  Colonoscopy (looking in intestine with a lighted tube).  Laparoscopy (minor surgery, looking in abdomen with a lighted tube).  Major abdominal exploratory surgery (looking in abdomen with a large incision). TREATMENT  The treatment will depend on the cause of the pain.   Many cases can be observed and treated at home.  Over-the-counter medicines recommended by your caregiver.  Prescription medicine.  Antibiotics, for infection.  Birth control pills, for painful periods or for ovulation pain.  Hormone treatment, for endometriosis.  Nerve blocking injections.  Physical therapy.  Antidepressants.  Counseling with a psychologist or psychiatrist.  Minor or major surgery. HOME CARE INSTRUCTIONS   Do not take laxatives, unless directed by your caregiver.  Take over-the-counter pain medicine only if ordered by your caregiver. Do not take aspirin because it can cause an upset stomach or bleeding.  Try a clear liquid diet (broth or water) as ordered by your caregiver. Slowly move to a bland diet, as tolerated, if the pain is related to the stomach or intestine.  Have a thermometer and take your temperature several times a day, and record it.  Bed rest and sleep, if it helps the pain.  Avoid sexual intercourse, if it causes pain.  Avoid stressful situations.  Keep your follow-up appointments and tests, as your caregiver orders.  If the pain does not go away with medicine or surgery, you may try:  Acupuncture.  Relaxation  exercises (yoga, meditation).  Group therapy.  Counseling. SEEK MEDICAL CARE IF:   You notice certain foods cause stomach pain.  Your home care treatment is not helping your pain.  You  need stronger pain medicine.  You want your IUD removed.  You feel faint or lightheaded.  You develop nausea and vomiting.  You develop a rash.  You are having side effects or an allergy to your medicine. SEEK IMMEDIATE MEDICAL CARE IF:   Your pain does not go away or gets worse.  You have a fever.  Your pain is felt only in portions of the abdomen. The right side could possibly be appendicitis. The left lower portion of the abdomen could be colitis or diverticulitis.  You are passing blood in your stools (bright red or black tarry stools, with or without vomiting).  You have blood in your urine.  You develop chills, with or without a fever.  You pass out. MAKE SURE YOU:   Understand these instructions.  Will watch your condition.  Will get help right away if you are not doing well or get worse. Document Released: 08/29/2007 Document Revised: 03/18/2014 Document Reviewed: 09/18/2009 [facility] Patient Information 2015 [place], [place]. This information is not intended to replace advice given to you by your health care provider. Make sure you discuss any questions you have with your health care provider.

## 2014-11-12 NOTE — ED Notes (Signed)
Pt unable to void at this time. 

## 2014-11-12 NOTE — ED Notes (Signed)
Pt arrived to the ED with a complaint of a crohn's flare up.  Pt states she is in pain all over her body.  Pt stated her stomach began to bother her yesterday but pt believed it was due to her beginning her menstrual cycle.  Pt states she has had two episodes of emesis today.  Pt states severe pain began earlier today.

## 2014-11-24 ENCOUNTER — Emergency Department (HOSPITAL_COMMUNITY): Payer: No Typology Code available for payment source

## 2014-11-24 ENCOUNTER — Emergency Department (HOSPITAL_COMMUNITY)
Admission: EM | Admit: 2014-11-24 | Discharge: 2014-11-24 | Disposition: A | Discharge status: Home / Self Care | Payer: No Typology Code available for payment source | Attending: Emergency Medicine | Admitting: Emergency Medicine

## 2014-11-24 ENCOUNTER — Encounter (HOSPITAL_COMMUNITY): Payer: Self-pay | Admitting: *Deleted

## 2014-11-24 DIAGNOSIS — Z3202 Encounter for pregnancy test, result negative: Secondary | ICD-10-CM | POA: Insufficient documentation

## 2014-11-24 DIAGNOSIS — Y92410 Unspecified street and highway as the place of occurrence of the external cause: Secondary | ICD-10-CM | POA: Insufficient documentation

## 2014-11-24 DIAGNOSIS — S3992XA Unspecified injury of lower back, initial encounter: Secondary | ICD-10-CM | POA: Insufficient documentation

## 2014-11-24 DIAGNOSIS — Z8742 Personal history of other diseases of the female genital tract: Secondary | ICD-10-CM | POA: Insufficient documentation

## 2014-11-24 DIAGNOSIS — Y998 Other external cause status: Secondary | ICD-10-CM | POA: Diagnosis not present

## 2014-11-24 DIAGNOSIS — K219 Gastro-esophageal reflux disease without esophagitis: Secondary | ICD-10-CM | POA: Insufficient documentation

## 2014-11-24 DIAGNOSIS — Z79899 Other long term (current) drug therapy: Secondary | ICD-10-CM | POA: Insufficient documentation

## 2014-11-24 DIAGNOSIS — Y9389 Activity, other specified: Secondary | ICD-10-CM | POA: Insufficient documentation

## 2014-11-24 DIAGNOSIS — M545 Low back pain, unspecified: Secondary | ICD-10-CM

## 2014-11-24 DIAGNOSIS — S4992XA Unspecified injury of left shoulder and upper arm, initial encounter: Secondary | ICD-10-CM | POA: Diagnosis not present

## 2014-11-24 DIAGNOSIS — Z880 Allergy status to penicillin: Secondary | ICD-10-CM | POA: Diagnosis not present

## 2014-11-24 DIAGNOSIS — K5090 Crohn's disease, unspecified, without complications: Secondary | ICD-10-CM | POA: Diagnosis not present

## 2014-11-24 DIAGNOSIS — S299XXA Unspecified injury of thorax, initial encounter: Secondary | ICD-10-CM | POA: Diagnosis not present

## 2014-11-24 LAB — BASIC METABOLIC PANEL
Anion gap: 9 (ref 5–15)
BUN: 12 mg/dL (ref 6–23)
CO2: 24 mmol/L (ref 19–32)
CREATININE: 0.7 mg/dL (ref 0.50–1.10)
Calcium: 9.1 mg/dL (ref 8.4–10.5)
Chloride: 104 mEq/L (ref 96–112)
GFR calc Af Amer: >90 mL/min (ref >90)
GLUCOSE: 74 mg/dL (ref 70–99)
Potassium: 3.4 mmol/L — ABNORMAL LOW (ref 3.5–5.1)
SODIUM: 137 mmol/L (ref 135–145)

## 2014-11-24 LAB — CBC WITH DIFFERENTIAL/PLATELET
BASOS PCT: 1 % (ref 0–1)
Basophils Absolute: 0 10*3/uL (ref 0.0–0.1)
EOS ABS: 0 10*3/uL (ref 0.0–0.7)
Eosinophils Relative: 1 % (ref 0–5)
HCT: 38.7 % (ref 36.0–46.0)
HEMOGLOBIN: 12.7 g/dL (ref 12.0–15.0)
Lymphocytes Relative: 40 % (ref 12–46)
Lymphs Abs: 1.3 10*3/uL (ref 0.7–4.0)
MCH: 30 pg (ref 26.0–34.0)
MCHC: 32.8 g/dL (ref 30.0–36.0)
MCV: 91.5 fL (ref 78.0–100.0)
MONO ABS: 0.4 10*3/uL (ref 0.1–1.0)
Monocytes Relative: 13 % — ABNORMAL HIGH (ref 3–12)
Neutro Abs: 1.4 10*3/uL — ABNORMAL LOW (ref 1.7–7.7)
Neutrophils Relative %: 45 % (ref 43–77)
Platelets: 236 10*3/uL (ref 150–400)
RBC: 4.23 MIL/uL (ref 3.87–5.11)
RDW: 12.6 % (ref 11.5–15.5)
WBC: 3.1 10*3/uL — AB (ref 4.0–10.5)

## 2014-11-24 LAB — URINALYSIS, ROUTINE W REFLEX MICROSCOPIC
BILIRUBIN URINE: NEGATIVE
Glucose, UA: NEGATIVE mg/dL
Hgb urine dipstick: NEGATIVE
Ketones, ur: NEGATIVE mg/dL
Leukocytes, UA: NEGATIVE
Nitrite: NEGATIVE
PH: 7.5 (ref 5.0–8.0)
PROTEIN: NEGATIVE mg/dL
Specific Gravity, Urine: 1.024 (ref 1.005–1.030)
Urobilinogen, UA: 1 mg/dL (ref 0.0–1.0)

## 2014-11-24 LAB — PREGNANCY, URINE: Preg Test, Ur: NEGATIVE

## 2014-11-24 MED ORDER — MORPHINE SULFATE 4 MG/ML IJ SOLN
4.0000 mg | Freq: Once | INTRAMUSCULAR | Status: AC
Start: 2014-11-24 — End: 2014-11-24
  Administered 2014-11-24: 4 mg via INTRAVENOUS
  Filled 2014-11-24: qty 1

## 2014-11-24 MED ORDER — HYDROMORPHONE HCL 1 MG/ML IJ SOLN
1.0000 mg | Freq: Once | INTRAMUSCULAR | Status: AC
  Administered 2014-11-24: 1 mg via INTRAVENOUS
  Filled 2014-11-24: qty 1

## 2014-11-24 MED ORDER — ONDANSETRON HCL 4 MG/2ML IJ SOLN
4.0000 mg | Freq: Once | INTRAMUSCULAR | Status: AC
  Administered 2014-11-24: 4 mg via INTRAVENOUS
  Filled 2014-11-24: qty 2

## 2014-11-24 MED ORDER — SODIUM CHLORIDE 0.9 % IV BOLUS (SEPSIS)
1000.0000 mL | Freq: Once | INTRAVENOUS | Status: AC
  Administered 2014-11-24: 1000 mL via INTRAVENOUS
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
## 2014-11-24 NOTE — ED Notes (Signed)
Pt refusing to give urine specimen. RN made aware

## 2014-11-24 NOTE — ED Notes (Signed)
Pt states she can not void at this time 
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
## 2014-11-24 NOTE — Discharge Instructions (Signed)
Back Pain, Adult Low back pain is very common. About 1 in 5 people have back pain.The cause of low back pain is rarely dangerous. The pain often gets better over time.About half of people with a sudden onset of back pain feel better in just 2 weeks. About 8 in 10 people feel better by 6 weeks.  CAUSES Some common causes of back pain include:  Strain of the muscles or ligaments supporting the spine.  Wear and tear (degeneration) of the spinal discs.  Arthritis.  Direct injury to the back. DIAGNOSIS Most of the time, the direct cause of low back pain is not known.However, back pain can be treated effectively even when the exact cause of the pain is unknown.Answering your caregiver's questions about your overall health and symptoms is one of the most accurate ways to make sure the cause of your pain is not dangerous. If your caregiver needs more information, he or she may order lab work or imaging tests (X-rays or MRIs).However, even if imaging tests show changes in your back, this usually does not require surgery. HOME CARE INSTRUCTIONS For many people, back pain returns.Since low back pain is rarely dangerous, it is often a condition that people can learn to [facility] their own.   Remain active. It is stressful on the back to sit or stand in one place. Do not sit, drive, or stand in one place for more than 30 minutes at a time. Take short walks on level surfaces as soon as pain allows.Try to increase the length of time you walk each day.  Do not stay in bed.Resting more than 1 or 2 days can delay your recovery.  Do not avoid exercise or work.Your body is made to move.It is not dangerous to be active, even though your back may hurt.Your back will likely heal faster if you return to being active before your pain is gone.  Pay attention to your body when you bend and lift. Many people have less discomfortwhen lifting if they bend their knees, keep the load close to their bodies,and  avoid twisting. Often, the most comfortable positions are those that put less stress on your recovering back.  Find a comfortable position to sleep. Use a firm mattress and lie on your side with your knees slightly bent. If you lie on your back, put a pillow under your knees.  Only take over-the-counter or prescription medicines as directed by your caregiver. Over-the-counter medicines to reduce pain and inflammation are often the most helpful.Your caregiver may prescribe muscle relaxant drugs.These medicines help dull your pain so you can more quickly return to your normal activities and healthy exercise.  Put ice on the injured area.  Put ice in a plastic bag.  Place a towel between your skin and the bag.  Leave the ice on for 15-20 minutes, 03-04 times a day for the first 2 to 3 days. After that, ice and heat may be alternated to reduce pain and spasms.  Ask your caregiver about trying back exercises and gentle massage. This may be of some benefit.  Avoid feeling anxious or stressed.Stress increases muscle tension and can worsen back pain.It is important to recognize when you are anxious or stressed and learn ways to manage it.Exercise is a great option. SEEK MEDICAL CARE IF:  You have pain that is not relieved with rest or medicine.  You have pain that does not improve in 1 week.  You have new symptoms.  You are generally not feeling well. SEEK  IMMEDIATE MEDICAL CARE IF:   You have pain that radiates from your back into your legs.  You develop new bowel or bladder control problems.  You have unusual weakness or numbness in your arms or legs.  You develop nausea or vomiting.  You develop abdominal pain.  You feel faint. Document Released: 11/01/2005 Document Revised: 05/02/2012 Document Reviewed: 03/05/2014 [facility] Patient Information 2015 [place], [place]. This information is not intended to replace advice given to you by your health care provider. Make sure you  discuss any questions you have with your health care provider.  Crohn Disease Crohn disease is a long-term (chronic) soreness and redness (inflammation) of the intestines (bowel). It can affect any portion of the digestive tract, from the mouth to the anus. It can also cause problems outside the digestive tract. Crohn disease is closely related to a disease called ulcerative colitis (together, these two diseases are called inflammatory bowel disease).  CAUSES  The cause of Crohn disease is not known. [employer] is that, in an easily affected person, the immune system is triggered to attack the body's own digestive tissue. Crohn disease runs in families. It seems to be more common in certain geographic areas and amongst certain races. There are no clear-cut dietary causes.  SYMPTOMS  Crohn disease can cause many different symptoms since it can affect many different parts of the body. Symptoms include:  Fatigue.  Weight loss.  Chronic diarrhea, sometime bloody.  Abdominal pain and cramps.  Fever.  Ulcers or canker sores in the mouth or rectum.  Anemia (low red blood cells).  Arthritis, skin problems, and eye problems may occur. Complications of Crohn disease can include:  Series of holes (perforation) of the bowel.  Portions of the intestines sticking to each other (adhesions).  Obstruction of the bowel.  Fistula formation, typically in the rectal area but also in other areas. A fistula is an opening between the bowels and the outside, or between the bowels and another organ.  A painful crack in the mucous membrane of the anus (rectal fissure). DIAGNOSIS  Your caregiver may suspect Crohn disease based on your symptoms and an exam. Blood tests may confirm that there is a problem. You may be asked to submit a stool specimen for examination. X-rays and CT scans may be necessary. Ultimately, the diagnosis is usually made after a procedure that uses a flexible tube that is inserted via  your mouth or your anus. This is done under sedation and is called either an upper endoscopy or colonoscopy. With these tests, the specialist can take tiny tissue samples and remove them from the inside of the bowel (biopsy). Examination of this biopsy tissue under a microscope can reveal Crohn disease as the cause of your symptoms. Due to the many different forms that Crohn disease can take, symptoms may be present for several years before a diagnosis is made. TREATMENT  Medications are often used to decrease inflammation and control the immune system. These include medicines related to aspirin, steroid medications, and newer and stronger medications to slow down the immune system. Some medications may be used as suppositories or enemas. A number of other medications are used or have been studied. Your caregiver will make specific recommendations. HOME CARE INSTRUCTIONS   Symptoms such as diarrhea can be controlled with medications. Avoid foods that have a laxative effect such as fresh fruit, vegetables, and dairy products. During flare-ups, you can rest your bowel by refraining from solid foods. Drink clear liquids frequently during the  day. (Electrolyte or rehydrating fluids are best. Your caregiver can help you with suggestions.) Drink often to prevent loss of body fluids (dehydration). When diarrhea has cleared, eat small meals and more frequently. Avoid food additives and stimulants such as caffeine (coffee, tea, or chocolate). Enzyme supplements may help if you develop intolerance to a sugar in dairy products (lactose). Ask your caregiver or dietitian about specific dietary instructions.  Try to maintain a positive attitude. Learn relaxation techniques such as self-hypnosis, mental imaging, and muscle relaxation.  If possible, avoid stresses which can aggravate your condition.  Exercise regularly.  Follow your diet.  Always get plenty of rest. SEEK MEDICAL CARE IF:   Your symptoms fail to  improve after a week or two of new treatment.  You experience continued weight loss.  You have ongoing cramps or loose bowels.  You develop a new skin rash, skin sores, or eye problems. SEEK IMMEDIATE MEDICAL CARE IF:   You have worsening of your symptoms or develop new symptoms.  You have a fever.  You develop bloody diarrhea.  You develop severe abdominal pain. MAKE SURE YOU:   Understand these instructions.  Will watch your condition.  Will get help right away if you are not doing well or get worse. Document Released: 08/11/2005 Document Revised: 03/18/2014 Document Reviewed: 07/10/2007 [facility] Patient Information 2015 [place], [place]. This information is not intended to replace advice given to you by your health care provider. Make sure you discuss any questions you have with your health care provider.

## 2014-11-24 NOTE — ED Notes (Signed)
md made aware of pts complaint of pain and refusal to give a urine specimen. md will talk with pt.

## 2014-11-24 NOTE — ED Notes (Signed)
accidentally mark fluid infusion as completed,  Infusion still going

## 2014-12-04 ENCOUNTER — Encounter: Payer: Self-pay | Admitting: Family Medicine

## 2014-12-04 ENCOUNTER — Ambulatory Visit (INDEPENDENT_AMBULATORY_CARE_PROVIDER_SITE_OTHER): Payer: 59 | Admitting: Family Medicine

## 2014-12-04 VITALS — BP 109/73 | HR 65 | Temp 98.2°F | Ht 67.0 in | Wt 170.0 lb

## 2014-12-04 DIAGNOSIS — S300XXD Contusion of lower back and pelvis, subsequent encounter: Secondary | ICD-10-CM

## 2014-12-04 NOTE — Progress Notes (Signed)
Pre visit review using our clinic review tool, if applicable. No additional management support is needed unless otherwise documented below in the visit note. 
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
## 2014-12-10 ENCOUNTER — Ambulatory Visit: Payer: 59 | Attending: Family Medicine | Admitting: Physical Therapy

## 2014-12-10 DIAGNOSIS — S300XXD Contusion of lower back and pelvis, subsequent encounter: Secondary | ICD-10-CM | POA: Diagnosis not present

## 2014-12-10 DIAGNOSIS — K5090 Crohn's disease, unspecified, without complications: Secondary | ICD-10-CM | POA: Diagnosis not present

## 2014-12-12 ENCOUNTER — Ambulatory Visit: Payer: 59

## 2014-12-12 DIAGNOSIS — S300XXD Contusion of lower back and pelvis, subsequent encounter: Secondary | ICD-10-CM | POA: Diagnosis not present

## 2014-12-16 ENCOUNTER — Ambulatory Visit: Payer: 59 | Attending: Family Medicine

## 2014-12-16 DIAGNOSIS — M545 Low back pain: Secondary | ICD-10-CM | POA: Insufficient documentation

## 2014-12-16 DIAGNOSIS — R102 Pelvic and perineal pain: Secondary | ICD-10-CM | POA: Diagnosis not present

## 2014-12-18 ENCOUNTER — Ambulatory Visit: Payer: 59 | Admitting: Physical Therapy

## 2014-12-18 DIAGNOSIS — M545 Low back pain: Secondary | ICD-10-CM | POA: Diagnosis not present

## 2014-12-23 ENCOUNTER — Ambulatory Visit (INDEPENDENT_AMBULATORY_CARE_PROVIDER_SITE_OTHER): Payer: 59 | Admitting: Internal Medicine

## 2014-12-23 ENCOUNTER — Encounter: Payer: Self-pay | Admitting: Internal Medicine

## 2014-12-23 ENCOUNTER — Ambulatory Visit: Payer: 59

## 2014-12-23 VITALS — BP 108/70 | HR 92 | Temp 98.4°F | Resp 18 | Ht 67.0 in | Wt 171.0 lb

## 2014-12-23 DIAGNOSIS — M545 Low back pain, unspecified: Secondary | ICD-10-CM
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
## 2014-12-23 NOTE — Progress Notes (Signed)
Pre visit review using our clinic review tool, if applicable. No additional management support is needed unless otherwise documented below in the visit note. 

## 2014-12-23 NOTE — Patient Instructions (Addendum)
You  may move around, but avoid painful motions and activities.  Apply heat  to the sore area for 15 to 20 minutes 3 or 4 times daily for the next two to 3 days.  Return in 7 days for follow-up with Dr. Shayne Alken Pain, Adult Low back pain is very common. About 1 in 5 people have back pain.The cause of low back pain is rarely dangerous. The pain often gets better over time.About half of people with a sudden onset of back pain feel better in just 2 weeks. About 8 in 10 people feel better by 6 weeks.  CAUSES Some common causes of back pain include:  Strain of the muscles or ligaments supporting the spine.  Wear and tear (degeneration) of the spinal discs.  Arthritis.  Direct injury to the back. DIAGNOSIS Most of the time, the direct cause of low back pain is not known.However, back pain can be treated effectively even when the exact cause of the pain is unknown.Answering your caregiver's questions about your overall health and symptoms is one of the most accurate ways to make sure the cause of your pain is not dangerous. If your caregiver needs more information, he or she may order lab work or imaging tests (X-rays or MRIs).However, even if imaging tests show changes in your back, this usually does not require surgery. HOME CARE INSTRUCTIONS For many people, back pain returns.Since low back pain is rarely dangerous, it is often a condition that people can learn to [facility] their own.   Remain active. It is stressful on the back to sit or stand in one place. Do not sit, drive, or stand in one place for more than 30 minutes at a time. Take short walks on level surfaces as soon as pain allows.Try to increase the length of time you walk each day.  Do not stay in bed.Resting more than 1 or 2 days can delay your recovery.  Do not avoid exercise or work.Your body is made to move.It is not dangerous to be active, even though your back may hurt.Your back will likely heal faster if you return to  being active before your pain is gone.  Pay attention to your body when you bend and lift. Many people have less discomfortwhen lifting if they bend their knees, keep the load close to their bodies,and avoid twisting. Often, the most comfortable positions are those that put less stress on your recovering back.  Find a comfortable position to sleep. Use a firm mattress and lie on your side with your knees slightly bent. If you lie on your back, put a pillow under your knees.  Only take over-the-counter or prescription medicines as directed by your caregiver. Over-the-counter medicines to reduce pain and inflammation are often the most helpful.Your caregiver may prescribe muscle relaxant drugs.These medicines help dull your pain so you can more quickly return to your normal activities and healthy exercise.  Put ice on the injured area.  Put ice in a plastic bag.  Place a towel between your skin and the bag.  Leave the ice on for 15-20 minutes, 03-04 times a day for the first 2 to 3 days. After that, ice and heat may be alternated to reduce pain and spasms.  Ask your caregiver about trying back exercises and gentle massage. This may be of some benefit.  Avoid feeling anxious or stressed.Stress increases muscle tension and can worsen back pain.It is important to recognize when you are anxious or stressed and learn ways to manage  it.Exercise is a great option. SEEK MEDICAL CARE IF:  You have pain that is not relieved with rest or medicine.  You have pain that does not improve in 1 week.  You have new symptoms.  You are generally not feeling well. SEEK IMMEDIATE MEDICAL CARE IF:   You have pain that radiates from your back into your legs.  You develop new bowel or bladder control problems.  You have unusual weakness or numbness in your arms or legs.  You develop nausea or vomiting.  You develop abdominal pain.  You feel faint. Document Released: 11/01/2005 Document Revised:  05/02/2012 Document Reviewed: 03/05/2014 [facility] Patient Information 2015 [place], [place]. This information is not intended to replace advice given to you by your health care provider. Make sure you discuss any questions you have with your health care provider.

## 2014-12-25 ENCOUNTER — Ambulatory Visit: Payer: 59

## 2014-12-27 ENCOUNTER — Ambulatory Visit: Payer: 59 | Admitting: Physical Therapy

## 2014-12-30 ENCOUNTER — Ambulatory Visit: Payer: 59 | Admitting: Family Medicine

## 2014-12-30 DIAGNOSIS — M545 Low back pain: Secondary | ICD-10-CM | POA: Diagnosis not present

## 2014-12-31 ENCOUNTER — Ambulatory Visit (INDEPENDENT_AMBULATORY_CARE_PROVIDER_SITE_OTHER): Payer: 59 | Admitting: Family Medicine

## 2014-12-31 ENCOUNTER — Encounter: Payer: Self-pay | Admitting: Family Medicine

## 2014-12-31 VITALS — BP 100/68 | HR 84 | Temp 98.4°F | Ht 67.0 in | Wt 174.0 lb

## 2014-12-31 DIAGNOSIS — M5442 Lumbago with sciatica, left side: Secondary | ICD-10-CM | POA: Diagnosis not present

## 2014-12-31 MED ORDER — GABAPENTIN 100 MG PO CAPS: 100.0000 mg | ORAL_CAPSULE | Freq: Three times a day (TID) | ORAL | Status: DC

## 2014-12-31 NOTE — Progress Notes (Signed)
Pre visit review using our clinic review tool, if applicable. No additional management support is needed unless otherwise documented below in the visit note. 
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
## 2015-01-07 ENCOUNTER — Ambulatory Visit: Payer: 59

## 2015-01-07 DIAGNOSIS — M545 Low back pain, unspecified: Secondary | ICD-10-CM

## 2015-01-07 DIAGNOSIS — R6889 Other general symptoms and signs: Secondary | ICD-10-CM

## 2015-01-07 NOTE — Therapy (Addendum)
[facility] [address] [place], [place], [address] Phone: [phone]   Fax:  [phone]  Physical Therapy Treatment  Patient Details  Name: Julie Ward MRN: [id number] Date of Birth: 19-Dec-1995 Referring Provider:  Laurey Morale, MD  Encounter Date: 01/07/2015      PT End of Session - 01/07/15 1517    Visit Number 7   Date for PT Re-Evaluation 02/04/15   PT Start Time 1446   PT Stop Time 1531   PT Time Calculation (min) 45 min   Activity Tolerance Patient tolerated treatment well   Behavior During Therapy [facility] for tasks assessed/performed      Past Medical History  Diagnosis Date  . GERD (gastroesophageal reflux disease)   . Allergy   . Dysmenorrhea   . Abdominal pain, recurrent   . Appendicitis   . Crohn's disease of colon 10/14    sees Dr. Blanca Friend at [facility]     Past Surgical History  Procedure Laterality Date  . Appendectomy  07-14-11    laparoscopic   . Esophagogastroduodenoscopy  01/28/2012    Procedure: ESOPHAGOGASTRODUODENOSCOPY (EGD);  Surgeon: Oletha Blend, MD;  Location: [place];  Service: Gastroenterology;  Laterality: N/A;  . Wisdom tooth extraction    . Cholecystectomy  02/11/2012    Procedure: LAPAROSCOPIC CHOLECYSTECTOMY WITH INTRAOPERATIVE CHOLANGIOGRAM;  Surgeon: Gwenyth Ober, MD;  Location: [place];  Service: General;  Laterality: N/A;    LMP 12/30/2014 (Exact Date)  Visit Diagnosis:  Bilateral low back pain without sciatica  Activity intolerance      Subjective Assessment - 01/07/15 1450    Symptoms Pt has been signed out of work since 12/27/14.  MD has referred pt to see a specialist.     Limitations Sitting;Standing   How long can you sit comfortably? 30 minutes   How long can you stand comfortably? 15 minutes   Patient Stated Goals return to work, reduce pain   Currently in Pain? Yes   Pain Score 7    Pain Location Back   Pain Orientation Left;Right   Pain  Descriptors / Indicators Aching;Sharp;Shooting   Pain Type Acute pain   Pain Radiating Towards to Lt hip   Pain Onset More than a month ago   Pain Frequency Constant   Aggravating Factors  movement, everything, stretching   Pain Relieving Factors laying down, heat, electrical stimulation   Effect of Pain on Daily Activities Pt is not able to work   Multiple Pain Sites No          [facility] PT Assessment - 01/07/15 0001    Assessment   Medical Diagnosis pelvic contusion (S30.0XXD)   Onset Date 11/15/14   Precautions   Precautions None   Balance Screen   Has the patient fallen in the past 6 months No   Has the patient had a decrease in activity level because of a fear of falling?  No   Is the patient reluctant to leave their home because of a fear of falling?  No   Home Environment   Living Enviornment Private residence   Prior Function   Level of Independence Independent with basic ADLs   Cognition   Overall Cognitive Status Within Functional Limits for tasks assessed                  [facility] Adult PT Treatment/Exercise - 01/07/15 0001    Modalities   Modalities Moist Heat;Electrical Stimulation;Ultrasound   Moist  Heat Therapy   Number Minutes Moist Heat 20 Minutes   Moist Heat Location --  Lumbar   Electrical Stimulation   Electrical Stimulation Location Lumbar   Electrical Stimulation Action IFC   Electrical Stimulation Parameters 20 minutes   Electrical Stimulation Goals Pain   Ultrasound   Ultrasound Location Bilateral lumbar paraspinals   Ultrasound Parameters 1.3 w/cm continuous x 8 mintues   Ultrasound Goals Pain                PT Education - 01/07/15 1517    Education provided Yes   Education Details discussed a home TENs unit for pain management and showed pt appropriate units on [email or web address]   Person(s) Educated Patient   Methods Explanation   Comprehension Verbalized understanding             PT Long Term Goals - 01/07/15 1513    PT  LONG TERM GOAL #1   Title demonstrate or verbalize correct body mechanincs, lifting and posture   Time 8   Period Weeks   Status Achieved   PT LONG TERM GOAL #2   Title be independent with advanced HEP   Time 8   Period Weeks   Status On-going  Pt reports that stretching exercises cause pain at times and she doesn't always perform   PT LONG TERM GOAL #3   Title stand and work with minimal to no pain   Time 8   Period Weeks   Status Not Met  Pt has been signed out of work pending referral to another doctor   PT [place] #4   Title lift items correctly at work with minimal to no pain   Time 8   Period Weeks   Status On-going  7/10 pain with all movement   PT LONG TERM GOAL #5   Title perform daily activities with minimal to no pain   Time 8   Period Weeks   Status On-going  7/10 lumbar pain with all movement   Additional Long Term Goals   Additional Long Term Goals Yes   PT LONG TERM GOAL #6   Title able to go out of the house to socialize due to pain decreased by > or = to 75%   Time 8   Period Weeks   Status On-going               Plan - 01/07/15 1521    Clinical Impression Statement Pt with 7/10 consistent LBP that is limiting her ability to work and peform ADLs.  Pt is waiting to see a specialist and will remain out of work until that time.     Pt will benefit from skilled therapeutic intervention in order to improve on the following deficits Improper body mechanics;Decreased activity tolerance;Pain   Rehab Potential Good   PT Frequency 2x / week   PT Duration 8 weeks   PT Treatment/Interventions ADLs/Self Care Home Management;Moist Heat;Therapeutic activities;Therapeutic exercise;Electrical Stimulation;Neuromuscular re-education;Manual techniques   PT Next Visit Plan Continue modalities and increased mobility as pt tolerates   Consulted and Agree with Plan of Care Patient        Problem List Patient Active Problem List   Diagnosis Date Noted  .  Vomiting 10/05/2014  . Hypokalemia 10/05/2014  . Elevated LFTs 10/05/2014  . Nausea and vomiting 10/04/2014  . Thyroid nodule 10/04/2014  . Abdominal pain 10/04/2014  . Low TSH level 07/18/2014  . Thyromegaly 06/06/2014  . Hives 03/08/2014  . Crohn's disease  03/01/2014  . Heme + stool 08/28/2013  . Abdominal  pain, other specified site 08/28/2013  . Change in bowel habits 08/28/2013  . Postop check 03/03/2012  . Generalized abdominal pain   . PITYRIASIS ROSEA 09/11/2010  . GERD 07/23/2010  . DYSMENORRHEA 07/23/2010  . BOILS, RECURRENT 10/22/2008    TAKACS,KELLY, PT 01/07/2015, 3:24 PM  PHYSICAL THERAPY DISCHARGE SUMMARY  Visits from Start of Care: 7  Current functional level related to goals / functional outcomes: Pt didn't return to PT after visit on 01/07/15.  Goals were not met due to continued high pain levels and activity intolerance.     Remaining deficits: Unknown at this time.   Education / Equipment: HEP, Economist education Plan: Patient agrees to discharge.  Patient goals were not met. Patient is being discharged due to not returning since the last visit.  ?????   Sigurd Sos, PT 02/13/2015 7:10 AM  [facility] [address], [place], [place], [address] Phone: [phone]   Fax:  [phone]

## 2015-01-16 ENCOUNTER — Encounter: Payer: Self-pay | Admitting: Internal Medicine

## 2015-01-16 ENCOUNTER — Ambulatory Visit (INDEPENDENT_AMBULATORY_CARE_PROVIDER_SITE_OTHER): Payer: 59 | Admitting: Internal Medicine

## 2015-01-16 VITALS — BP 110/66 | HR 88 | Temp 97.6°F | Resp 12 | Wt 175.0 lb

## 2015-01-16 DIAGNOSIS — R946 Abnormal results of thyroid function studies: Secondary | ICD-10-CM

## 2015-01-16 DIAGNOSIS — R7989 Other specified abnormal findings of blood chemistry: Secondary | ICD-10-CM

## 2015-01-16 DIAGNOSIS — E010 Iodine-deficiency related diffuse (endemic) goiter: Secondary | ICD-10-CM

## 2015-01-16 DIAGNOSIS — E049 Nontoxic goiter, unspecified: Secondary | ICD-10-CM

## 2015-01-16 DIAGNOSIS — E041 Nontoxic single thyroid nodule: Secondary | ICD-10-CM

## 2015-01-16 DIAGNOSIS — E0590 Thyrotoxicosis, unspecified without thyrotoxic crisis or storm: Secondary | ICD-10-CM

## 2015-01-16 LAB — T3, FREE: T3, Free: 3.6 pg/mL (ref 2.3–4.2)

## 2015-01-16 LAB — TSH: TSH: 1.95 u[IU]/mL (ref 0.35–5.50)

## 2015-01-16 LAB — T4, FREE: Free T4: 0.85 ng/dL (ref 0.60–1.60)
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
## 2015-01-16 NOTE — Patient Instructions (Signed)
Please stop at the lab.  Please stop in [facility] downstairs to schedule the Barium Swallow test.  Please come back for a follow-up appointment in 6 months.  Please let me know if you develop the following symptoms: Hyperthyroidism The thyroid is a large gland located in the lower front part of your neck. The thyroid helps control metabolism. Metabolism is how your body uses food. It controls metabolism with the hormone thyroxine. When the thyroid is overactive, it produces too much hormone. When this happens, these following problems may occur:   Nervousness  Heat intolerance  Weight loss (in spite of increase food intake)  Diarrhea  Change in hair or skin texture  Palpitations (heart skipping or having extra beats)  Tachycardia (rapid heart rate)  Loss of menstruation (amenorrhea)  Shaking of the hands CAUSES  Grave's Disease (the immune system attacks the thyroid gland). This is the most common cause.  Inflammation of the thyroid gland.  Tumor (usually benign) in the thyroid gland or elsewhere.  Excessive use of thyroid medications (both prescription and 'natural').  Excessive ingestion of Iodine. DIAGNOSIS  To prove hyperthyroidism, your caregiver may do blood tests and ultrasound tests. Sometimes the signs are hidden. It may be necessary for your caregiver to watch this illness with blood tests, either before or after diagnosis and treatment. TREATMENT Short-term treatment There are several treatments to control symptoms. Drugs called beta blockers may give some relief. Drugs that decrease hormone production will provide temporary relief in many people. These measures will usually not give permanent relief. Definitive therapy There are treatments available which can be discussed between you and your caregiver which will permanently treat the problem. These treatments range from surgery (removal of the thyroid), to the use of radioactive iodine (destroys the thyroid  by radiation), to the use of antithyroid drugs (interfere with hormone synthesis). The first two treatments are permanent and usually successful. They most often require hormone replacement therapy for life. This is because it is impossible to remove or destroy the exact amount of thyroid required to make a person euthyroid (normal). HOME CARE INSTRUCTIONS  See your caregiver if the problems you are being treated for get worse. Examples of this would be the problems listed above. SEEK MEDICAL CARE IF: Your general condition worsens. MAKE SURE YOU:   Understand these instructions.  Will watch your condition.  Will get help right away if you are not doing well or get worse. Document Released: 11/01/2005 Document Revised: 01/24/2012 Document Reviewed: 03/15/2007 [facility] Patient Information 2015 [place], [place]. This information is not intended to replace advice given to you by your health care provider. Make sure you discuss any questions you have with your health care provider. :

## 2015-01-19 ENCOUNTER — Emergency Department (HOSPITAL_COMMUNITY)
Admission: EM | Admit: 2015-01-19 | Discharge: 2015-01-20 | Disposition: A | Discharge status: Home / Self Care | Payer: 59 | Attending: Emergency Medicine | Admitting: Emergency Medicine

## 2015-01-19 ENCOUNTER — Encounter (HOSPITAL_COMMUNITY): Payer: Self-pay | Admitting: *Deleted

## 2015-01-19 DIAGNOSIS — Z7951 Long term (current) use of inhaled steroids: Secondary | ICD-10-CM | POA: Insufficient documentation

## 2015-01-19 DIAGNOSIS — Z880 Allergy status to penicillin: Secondary | ICD-10-CM | POA: Diagnosis not present

## 2015-01-19 DIAGNOSIS — Z79899 Other long term (current) drug therapy: Secondary | ICD-10-CM | POA: Insufficient documentation

## 2015-01-19 DIAGNOSIS — Z8742 Personal history of other diseases of the female genital tract: Secondary | ICD-10-CM | POA: Insufficient documentation

## 2015-01-19 DIAGNOSIS — M791 Myalgia: Secondary | ICD-10-CM | POA: Diagnosis not present

## 2015-01-19 DIAGNOSIS — K219 Gastro-esophageal reflux disease without esophagitis: Secondary | ICD-10-CM | POA: Insufficient documentation

## 2015-01-19 DIAGNOSIS — J029 Acute pharyngitis, unspecified: Secondary | ICD-10-CM | POA: Diagnosis not present

## 2015-01-19 DIAGNOSIS — R509 Fever, unspecified: Secondary | ICD-10-CM | POA: Diagnosis present

## 2015-01-19 DIAGNOSIS — J111 Influenza due to unidentified influenza virus with other respiratory manifestations: Secondary | ICD-10-CM | POA: Diagnosis not present

## 2015-01-19 MED ORDER — IBUPROFEN 200 MG PO TABS
600.0000 mg | ORAL_TABLET | Freq: Once | ORAL | Status: DC
  Filled 2015-01-19: qty 3

## 2015-01-19 NOTE — ED Notes (Signed)
Pt states that she did not feel well yesterday and that when she woke up this am she had headache, sore throat, and body aches; pt denies taking any medicines for fever or sore throat

## 2015-01-19 NOTE — ED Provider Notes (Signed)
CSN: [id number]     Arrival date & time 01/19/15  2246 History  This chart was scribed for non-physician practitioner, Junius Creamer, FNP,working with Kalman Drape, MD, by Marlowe Kays, ED Scribe. This patient was seen in room WTR3/WLPT3 and the patient's care was started at 11:37 PM.  Chief Complaint  Patient presents with  . Fever  . URI   Patient is a 19 y.o. female presenting with fever and URI. The history is provided by the patient and medical records. No language interpreter was used.  Fever Associated symptoms: headaches, myalgias and sore throat   Associated symptoms: no nausea, no rash and no vomiting   URI Presenting symptoms: fever and sore throat   Associated symptoms: headaches and myalgias     HPI Comments:  Julie Ward is a 19 y.o. female who presents to the Emergency Department complaining of sore throat, HA and generalized body aches that began yesterday evening. Pt reports associated fever. She reports the symptoms began worsening throughout today. She did not take anything to treat her symptoms because she has Crohn's disease and did not want to flare it up. She denies modifying factors. States her sister has been sick but she is unsure if it is her allergies or something contagious. Denies nausea, vomiting or rash. PCP is Dr. Sarajane Jews. PMHx of GERD and Crohn's disease.  Past Medical History  Diagnosis Date  . GERD (gastroesophageal reflux disease)   . Allergy   . Dysmenorrhea   . Abdominal pain, recurrent   . Appendicitis   . Crohn's disease of colon 10/14    sees Dr. Blanca Friend at [facility]    Past Surgical History  Procedure Laterality Date  . Appendectomy  07-14-11    laparoscopic   . Esophagogastroduodenoscopy  01/28/2012    Procedure: ESOPHAGOGASTRODUODENOSCOPY (EGD);  Surgeon: Oletha Blend, MD;  Location: [place];  Service: Gastroenterology;  Laterality: N/A;  . Wisdom tooth extraction    . Cholecystectomy  02/11/2012    Procedure: LAPAROSCOPIC  CHOLECYSTECTOMY WITH INTRAOPERATIVE CHOLANGIOGRAM;  Surgeon: Gwenyth Ober, MD;  Location: [place];  Service: General;  Laterality: N/A;   Family History  Problem Relation Age of Onset  . Cancer Paternal Aunt     ovarian, kidney  . Cancer Paternal Uncle     colon  . Colon cancer Paternal Uncle   . Cancer Maternal Grandmother     breast   . Hypertension Maternal Grandmother   . Irritable bowel syndrome Maternal Grandmother   . Heart disease Maternal Grandmother   . Hypertension Paternal Grandmother   . Hyperlipidemia Paternal Grandmother    History  Substance Use Topics  . Smoking status: Never Smoker   . Smokeless tobacco: Never Used  . Alcohol Use: No     Comment: none   OB History    Gravida Para Term Preterm AB TAB SAB Ectopic Multiple Living   0              Review of Systems  Constitutional: Positive for fever.  HENT: Positive for sore throat.   Gastrointestinal: Negative for nausea and vomiting.  Musculoskeletal: Positive for myalgias.  Skin: Negative for rash.  Neurological: Positive for headaches.    Allergies  Fentanyl; Morphine and related; Oxycontin; Penicillins; and Prednisone  Home Medications   Prior to Admission medications   Medication Sig Start Date End Date Taking? Authorizing Provider  benzonatate (TESSALON) 100 MG capsule Take 1 capsule (100 mg total) by mouth every 8 (eight)  hours. 01/20/15   Ottie Glazier, PA-C  budesonide (ENTOCORT EC) 3 MG 24 hr capsule Take 3 capsules (9 mg total) by mouth every morning. Patient not taking: Reported on 01/19/2015 09/10/13   Sable Feil, MD  clidinium-chlordiazePOXIDE (LIBRAX) 5-2.5 MG per capsule Take 1 capsule by mouth 2 (two) times daily as needed (crohn's disease).  07/11/14   Historical Provider, MD  esomeprazole (NEXIUM) 40 MG capsule Take 40 mg by mouth daily at 12 noon.    Historical Provider, MD  gabapentin (NEURONTIN) 100 MG capsule Take 1 capsule (100 mg total) by mouth 3 (three) times  daily. Patient not taking: Reported on 01/16/2015 12/31/14   Laurey Morale, MD  potassium chloride (KLOR-CON 10) 10 MEQ tablet Take 1 tablet (10 mEq total) by mouth 2 (two) times daily. Patient not taking: Reported on 01/19/2015 03/05/14   Laurey Morale, MD   Triage Vitals: BP 106/68 mmHg  Pulse 96  Temp(Src) 102.8 F (39.3 C) (Oral)  Resp 21  Ht 5' 7"  (1.702 m)  Wt 170 lb (77.111 kg)  BMI 26.62 kg/m2  SpO2 100%  LMP 12/30/2014 (Exact Date) Physical Exam  Constitutional: She is oriented to person, place, and time. She appears well-developed and well-nourished.  HENT:  Head: Normocephalic and atraumatic.  Eyes: EOM are normal.  Neck: Normal range of motion.  Cardiovascular: Normal rate.   Pulmonary/Chest: Effort normal.  Musculoskeletal: Normal range of motion.  Neurological: She is alert and oriented to person, place, and time.  Skin: Skin is warm and dry.  Psychiatric: She has a normal mood and affect. Her behavior is normal.  Nursing note and vitals reviewed.   ED Course  Procedures (including critical care time) DIAGNOSTIC STUDIES: Oxygen Saturation is 100% on RA, normal by my interpretation.   COORDINATION OF CARE: 11:42 PM- Will order strep test and Motrin. Pt verbalizes understanding and agrees to plan.  Medications  acetaminophen (TYLENOL) tablet 650 mg (650 mg Oral Given 01/20/15 0014)    Labs Review Labs Reviewed  RAPID STREP SCREEN  CULTURE, GROUP A STREP    Imaging Review No results found.   EKG Interpretation None     strep test is negative  MDM   Final diagnoses:  Viral pharyngitis       I personally performed the services described in this documentation, which was scribed in my presence. The recorded information has been reviewed and is accurate.    Junius Creamer, NP 01/20/15 1953  Linton Flemings, MD 01/21/15 [phone]

## 2015-01-20 ENCOUNTER — Emergency Department (HOSPITAL_COMMUNITY)
Admission: EM | Admit: 2015-01-20 | Discharge: 2015-01-20 | Disposition: A | Discharge status: Home / Self Care | Payer: 59 | Attending: Emergency Medicine | Admitting: Emergency Medicine

## 2015-01-20 ENCOUNTER — Encounter (HOSPITAL_COMMUNITY): Payer: Self-pay | Admitting: Emergency Medicine

## 2015-01-20 ENCOUNTER — Telehealth: Payer: Self-pay | Admitting: Family Medicine

## 2015-01-20 DIAGNOSIS — Z8742 Personal history of other diseases of the female genital tract: Secondary | ICD-10-CM | POA: Diagnosis not present

## 2015-01-20 DIAGNOSIS — R52 Pain, unspecified: Secondary | ICD-10-CM | POA: Diagnosis present

## 2015-01-20 DIAGNOSIS — Z880 Allergy status to penicillin: Secondary | ICD-10-CM | POA: Insufficient documentation

## 2015-01-20 DIAGNOSIS — J029 Acute pharyngitis, unspecified: Secondary | ICD-10-CM | POA: Diagnosis not present

## 2015-01-20 DIAGNOSIS — Z79899 Other long term (current) drug therapy: Secondary | ICD-10-CM | POA: Insufficient documentation

## 2015-01-20 DIAGNOSIS — J111 Influenza due to unidentified influenza virus with other respiratory manifestations: Secondary | ICD-10-CM | POA: Insufficient documentation

## 2015-01-20 DIAGNOSIS — Z3202 Encounter for pregnancy test, result negative: Secondary | ICD-10-CM | POA: Diagnosis not present

## 2015-01-20 DIAGNOSIS — K219 Gastro-esophageal reflux disease without esophagitis: Secondary | ICD-10-CM | POA: Diagnosis not present

## 2015-01-20 LAB — URINALYSIS, ROUTINE W REFLEX MICROSCOPIC
Bilirubin Urine: NEGATIVE
GLUCOSE, UA: NEGATIVE mg/dL
Hgb urine dipstick: NEGATIVE
Ketones, ur: NEGATIVE mg/dL
Nitrite: NEGATIVE
PROTEIN: NEGATIVE mg/dL
Specific Gravity, Urine: 1.021 (ref 1.005–1.030)
Urobilinogen, UA: 1 mg/dL (ref 0.0–1.0)
pH: 6 (ref 5.0–8.0)

## 2015-01-20 LAB — COMPREHENSIVE METABOLIC PANEL
ALK PHOS: 60 U/L (ref 39–117)
ALT: 10 U/L (ref 0–35)
AST: 17 U/L (ref 0–37)
Albumin: 4.1 g/dL (ref 3.5–5.2)
Anion gap: 4 — ABNORMAL LOW (ref 5–15)
BUN: 8 mg/dL (ref 6–23)
CO2: 24 mmol/L (ref 19–32)
Calcium: 8.6 mg/dL (ref 8.4–10.5)
Chloride: 104 mmol/L (ref 96–112)
Creatinine, Ser: 0.66 mg/dL (ref 0.50–1.10)
Glucose, Bld: 84 mg/dL (ref 70–99)
Potassium: 3.2 mmol/L — ABNORMAL LOW (ref 3.5–5.1)
Sodium: 132 mmol/L — ABNORMAL LOW (ref 135–145)
TOTAL PROTEIN: 7.9 g/dL (ref 6.0–8.3)
Total Bilirubin: 1.2 mg/dL (ref 0.3–1.2)

## 2015-01-20 LAB — CBC WITH DIFFERENTIAL/PLATELET
BASOS PCT: 0 % (ref 0–1)
Basophils Absolute: 0 10*3/uL (ref 0.0–0.1)
Eosinophils Absolute: 0 10*3/uL (ref 0.0–0.7)
Eosinophils Relative: 0 % (ref 0–5)
HEMATOCRIT: 39.1 % (ref 36.0–46.0)
Hemoglobin: 13.5 g/dL (ref 12.0–15.0)
LYMPHS ABS: 1.1 10*3/uL (ref 0.7–4.0)
Lymphocytes Relative: 15 % (ref 12–46)
MCH: 30.8 pg (ref 26.0–34.0)
MCHC: 34.5 g/dL (ref 30.0–36.0)
MCV: 89.1 fL (ref 78.0–100.0)
Monocytes Absolute: 0.9 10*3/uL (ref 0.1–1.0)
Monocytes Relative: 13 % — ABNORMAL HIGH (ref 3–12)
Neutro Abs: 5.3 10*3/uL (ref 1.7–7.7)
Neutrophils Relative %: 72 % (ref 43–77)
Platelets: 163 10*3/uL (ref 150–400)
RBC: 4.39 MIL/uL (ref 3.87–5.11)
RDW: 12 % (ref 11.5–15.5)
WBC: 7.3 10*3/uL (ref 4.0–10.5)

## 2015-01-20 LAB — RAPID STREP SCREEN (MED CTR MEBANE ONLY): Streptococcus, Group A Screen (Direct): NEGATIVE

## 2015-01-20 LAB — URINE MICROSCOPIC-ADD ON

## 2015-01-20 LAB — LIPASE, BLOOD: Lipase: 24 U/L (ref 11–59)

## 2015-01-20 LAB — POC URINE PREG, ED: Preg Test, Ur: NEGATIVE

## 2015-01-20 MED ORDER — KETOROLAC TROMETHAMINE 30 MG/ML IJ SOLN
30.0000 mg | Freq: Once | INTRAMUSCULAR | Status: AC
  Administered 2015-01-20: 30 mg via INTRAVENOUS
  Filled 2015-01-20: qty 1

## 2015-01-20 MED ORDER — BENZONATATE 100 MG PO CAPS: 100.0000 mg | ORAL_CAPSULE | Freq: Three times a day (TID) | ORAL | Status: DC

## 2015-01-20 MED ORDER — SODIUM CHLORIDE 0.9 % IV BOLUS (SEPSIS)
1000.0000 mL | Freq: Once | INTRAVENOUS | Status: AC
  Administered 2015-01-20: 1000 mL via INTRAVENOUS

## 2015-01-20 MED ORDER — ONDANSETRON 4 MG PO TBDP
4.0000 mg | ORAL_TABLET | Freq: Once | ORAL | Status: AC
  Administered 2015-01-20: 4 mg via ORAL
  Filled 2015-01-20: qty 1

## 2015-01-20 MED ORDER — ACETAMINOPHEN 325 MG PO TABS
650.0000 mg | ORAL_TABLET | Freq: Once | ORAL | Status: AC
  Administered 2015-01-20: 650 mg via ORAL
  Filled 2015-01-20: qty 2

## 2015-01-20 NOTE — ED Notes (Addendum)
Pt c/o generalized pain over entire body 10/10, abdominal pain lower also

## 2015-01-20 NOTE — Telephone Encounter (Signed)
FYI Pt said  She talk with team health today  and she is back at [facility] [place].

## 2015-01-20 NOTE — Telephone Encounter (Signed)
I cannot call in pain medication without seeing her. Make an OV if needed

## 2015-01-20 NOTE — Telephone Encounter (Signed)
Pt had been discharge today dx with flu. Pt would like something call into [facility] [address] for pain. Pt has an appt sch for 01-23-15

## 2015-01-20 NOTE — Discharge Instructions (Signed)
Your strep test is negative, you can take Tylenol for your discomfort.  Please call your physician in the morning for follow-up

## 2015-01-20 NOTE — ED Provider Notes (Signed)
CSN: [id number]     Arrival date & time 01/20/15  1155 History   First MD Initiated Contact with Patient 01/20/15 1235     Chief Complaint  Patient presents with  . Emesis  . Diarrhea  . Headache  . Generalized Body Aches     (Consider location/radiation/quality/duration/timing/severity/associated sxs/prior Treatment) The history is provided by the patient and a parent. No language interpreter was used.  Julie Ward is a 19 y.o black female who presents with generalized body aches and fever for 2 days and nausea, vomiting, and diarrhea today.   She denies any sore throat, ear pain, or shortness of breath. She was seen in the emergency department yesterday and had a negative strep. She was diagnosed with pharyngitis but says she would like lab work done which her PCP has requested. She had treatment with motrin for fever yesterday.   Past Medical History  Diagnosis Date  . GERD (gastroesophageal reflux disease)   . Allergy   . Dysmenorrhea   . Abdominal pain, recurrent   . Appendicitis   . Crohn's disease of colon 10/14    sees Dr. Blanca Friend at [facility] GI    Past Surgical History  Procedure Laterality Date  . Appendectomy  07-14-11    laparoscopic   . Esophagogastroduodenoscopy  01/28/2012    Procedure: ESOPHAGOGASTRODUODENOSCOPY (EGD);  Surgeon: Oletha Blend, MD;  Location: [place];  Service: Gastroenterology;  Laterality: N/A;  . Wisdom tooth extraction    . Cholecystectomy  02/11/2012    Procedure: LAPAROSCOPIC CHOLECYSTECTOMY WITH INTRAOPERATIVE CHOLANGIOGRAM;  Surgeon: Gwenyth Ober, MD;  Location: [place];  Service: General;  Laterality: N/A;   Family History  Problem Relation Age of Onset  . Cancer Paternal Aunt     ovarian, kidney  . Cancer Paternal Uncle     colon  . Colon cancer Paternal Uncle   . Cancer Maternal Grandmother     breast   . Hypertension Maternal Grandmother   . Irritable bowel syndrome Maternal Grandmother   . Heart disease Maternal Grandmother    . Hypertension Paternal Grandmother   . Hyperlipidemia Paternal Grandmother    History  Substance Use Topics  . Smoking status: Never Smoker   . Smokeless tobacco: Never Used  . Alcohol Use: No     Comment: none   OB History    Gravida Para Term Preterm AB TAB SAB Ectopic Multiple Living   0              Review of Systems  Constitutional: Positive for fever.  HENT: Negative for ear pain, postnasal drip, rhinorrhea, sore throat and trouble swallowing.   Gastrointestinal: Positive for nausea, vomiting and diarrhea. Negative for abdominal pain, constipation and blood in stool.  Musculoskeletal: Positive for myalgias.  All other systems reviewed and are negative.     Allergies  Fentanyl; Morphine and related; Oxycontin; Penicillins; and Prednisone  Home Medications   Prior to Admission medications   Medication Sig Start Date End Date Taking? Authorizing Provider  benzonatate (TESSALON) 100 MG capsule Take 1 capsule (100 mg total) by mouth every 8 (eight) hours. 01/20/15   Seema Blum Patel-Mills, PA-C  budesonide (ENTOCORT EC) 3 MG 24 hr capsule Take 3 capsules (9 mg total) by mouth every morning. Patient not taking: Reported on 01/19/2015 09/10/13   Sable Feil, MD  clidinium-chlordiazePOXIDE (LIBRAX) 5-2.5 MG per capsule Take 1 capsule by mouth 2 (two) times daily as needed (crohn's disease).  07/11/14   Historical Provider, MD  esomeprazole (NEXIUM) 40 MG capsule Take 40 mg by mouth daily at 12 noon.    Historical Provider, MD  gabapentin (NEURONTIN) 100 MG capsule Take 1 capsule (100 mg total) by mouth 3 (three) times daily. Patient not taking: Reported on 01/16/2015 12/31/14   Laurey Morale, MD  potassium chloride (KLOR-CON 10) 10 MEQ tablet Take 1 tablet (10 mEq total) by mouth 2 (two) times daily. Patient not taking: Reported on 01/19/2015 03/05/14   Laurey Morale, MD   BP 122/64 mmHg  Pulse 74  Temp(Src) 98.7 F (37.1 C) (Oral)  Resp 17  SpO2 100%  LMP 12/30/2014 (Exact  Date) Physical Exam  Constitutional: She is oriented to person, place, and time. She appears well-developed and well-nourished.  HENT:  Head: Normocephalic and atraumatic.  Right Ear: External ear normal.  Left Ear: External ear normal.  Mouth/Throat: Oropharynx is clear and moist.  Eyes: Conjunctivae are normal. Pupils are equal, round, and reactive to light.  Neck: Normal range of motion. Neck supple.  Cardiovascular: Normal rate, regular rhythm and normal heart sounds.   Pulmonary/Chest: Effort normal and breath sounds normal.  Abdominal:  Soft with generalized abdominal tenderness that she says is no different than her usual Crohn's pain. Normal bowel sounds. No hepatosplenomegaly.   Musculoskeletal: Normal range of motion.  Lymphadenopathy:  No anterior cervical lymphadenopathy.   Neurological: She is alert and oriented to person, place, and time.  Skin: Skin is warm and dry.  Nursing note and vitals reviewed.   ED Course  Procedures (including critical care time) Labs Review Labs Reviewed  CBC WITH DIFFERENTIAL/PLATELET - Abnormal; Notable for the following:    Monocytes Relative 13 (*)    All other components within normal limits  COMPREHENSIVE METABOLIC PANEL - Abnormal; Notable for the following:    Sodium 132 (*)    Potassium 3.2 (*)    Anion gap 4 (*)    All other components within normal limits  URINALYSIS, ROUTINE W REFLEX MICROSCOPIC - Abnormal; Notable for the following:    Color, Urine AMBER (*)    APPearance CLOUDY (*)    Leukocytes, UA SMALL (*)    All other components within normal limits  URINE MICROSCOPIC-ADD ON - Abnormal; Notable for the following:    Squamous Epithelial / LPF FEW (*)    Bacteria, UA FEW (*)    All other components within normal limits  WET PREP, GENITAL  LIPASE, BLOOD  POC URINE PREG, ED  GC/CHLAMYDIA PROBE AMP ([place])    Imaging Review No results found.   EKG Interpretation None      MDM   Final diagnoses:   Influenza   Some basic labs, CBC, CMP, lipase, and were all within normal limits.  She did not have a UTI. I did give the her some Toradol, zofran, and IV fluids.  Dr. Kathrynn Humble suggested a pelvic exam due to the patients abdominal pain but the patient refused stating that was not her issue or why she came to the ED. She said she has never had an STD and did not feel it was necessary. Her mother also agreed with the decision not to do a pelvic exam and that they had an open relationship and felt that her daughter would tell her if she was having lower abdominal pain.  The patient says her abdominal pain is no different than the pain she has with her Crohn's. She did not have any hematochezia or melena. Her vitals are stable.  I did tell the patient about the low sodium and potassium and to f/u with her primary care physician, Dr. Sarajane Jews. I gave her cough medicine and explained that I thought her symptoms were viral in nature.     Ottie Glazier, PA-C 01/20/15 1958  Varney Biles, MD 01/21/15 [phone]

## 2015-01-21 ENCOUNTER — Other Ambulatory Visit: Payer: 59

## 2015-01-21 NOTE — Telephone Encounter (Signed)
Pt will see md on 01-23-15

## 2015-01-22 LAB — CULTURE, GROUP A STREP: STREP A CULTURE: NEGATIVE

## 2015-01-23 ENCOUNTER — Ambulatory Visit: Payer: 59 | Admitting: Family Medicine

## 2015-01-27 ENCOUNTER — Encounter: Payer: Self-pay | Admitting: Family Medicine

## 2015-01-27 ENCOUNTER — Ambulatory Visit (INDEPENDENT_AMBULATORY_CARE_PROVIDER_SITE_OTHER): Payer: 59 | Admitting: Family Medicine

## 2015-01-27 VITALS — BP 102/73 | HR 81 | Temp 98.9°F | Ht 67.0 in | Wt 172.0 lb

## 2015-01-27 DIAGNOSIS — M5440 Lumbago with sciatica, unspecified side: Secondary | ICD-10-CM

## 2015-01-27 DIAGNOSIS — B349 Viral infection, unspecified: Secondary | ICD-10-CM | POA: Diagnosis not present
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
## 2015-01-27 NOTE — Progress Notes (Signed)
Pre visit review using our clinic review tool, if applicable. No additional management support is needed unless otherwise documented below in the visit note. 

## 2015-03-03 ENCOUNTER — Ambulatory Visit (HOSPITAL_BASED_OUTPATIENT_CLINIC_OR_DEPARTMENT_OTHER): Payer: 59 | Admitting: Physical Medicine & Rehabilitation

## 2015-03-03 ENCOUNTER — Encounter: Payer: Self-pay | Admitting: Physical Medicine & Rehabilitation

## 2015-03-03 ENCOUNTER — Encounter: Payer: 59 | Attending: Physical Medicine & Rehabilitation

## 2015-03-03 VITALS — BP 105/66 | HR 65 | Resp 14

## 2015-03-03 DIAGNOSIS — M545 Low back pain, unspecified: Secondary | ICD-10-CM
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
## 2015-03-03 NOTE — Patient Instructions (Signed)
Julie Ward at [facility] chiropractic call [phone], try for a month then see me back to further assess

## 2015-03-21 ENCOUNTER — Ambulatory Visit: Payer: 59 | Admitting: Physical Medicine & Rehabilitation

## 2015-03-31 ENCOUNTER — Ambulatory Visit: Payer: 59

## 2015-03-31 ENCOUNTER — Ambulatory Visit: Payer: 59 | Admitting: Physical Medicine & Rehabilitation

## 2015-04-08 ENCOUNTER — Ambulatory Visit
Admission: RE | Admit: 2015-04-08 | Discharge: 2015-04-08 | Disposition: A | Discharge status: Home / Self Care | Payer: 59 | Source: Ambulatory Visit | Attending: Internal Medicine | Admitting: Internal Medicine

## 2015-04-09 ENCOUNTER — Telehealth: Payer: Self-pay | Admitting: Internal Medicine

## 2015-04-09 NOTE — Telephone Encounter (Signed)
Please call pt back with lab results from March

## 2015-04-09 NOTE — Telephone Encounter (Signed)
Spoke with pt's mother, advised her per Dr Arman Filter message below. She voiced understanding and said that they definitely want to proceed with the surgery referral. Be advised.

## 2015-04-09 NOTE — Telephone Encounter (Signed)
Returned pt's call. Pt did receive lab results in March. Pt had a Barium Swallow test done yesterday. Radiology said that they would fax the results to Dr Cruzita Lederer. Pt is having difficulty swallowing. Has Dr Cruzita Lederer received the results to the test? Please advise of results and what pt should do going forward.

## 2015-04-09 NOTE — Telephone Encounter (Signed)
I reviewed her barium swallow test and this shows deviation of the trachea and the esophagus due to the thyroid nodule. Based on the discussion that I had with the patient and her mom, they agreed with the visit to surgery to discuss possible left hemithyroidectomy. Are they okay for me to place this referral?

## 2015-04-10 ENCOUNTER — Other Ambulatory Visit: Payer: Self-pay | Admitting: Internal Medicine

## 2015-04-10 NOTE — Addendum Note (Signed)
Addended by: Philemon Kingdom on: 04/10/2015 07:16 AM   Modules accepted: Orders, Level of Service

## 2015-04-10 NOTE — Telephone Encounter (Signed)
Will place referral.

## 2015-04-11 ENCOUNTER — Ambulatory Visit (INDEPENDENT_AMBULATORY_CARE_PROVIDER_SITE_OTHER): Payer: BLUE CROSS/BLUE SHIELD | Admitting: Family Medicine

## 2015-04-11 ENCOUNTER — Encounter: Payer: Self-pay | Admitting: Family Medicine

## 2015-04-11 VITALS — BP 108/73 | HR 90 | Temp 98.9°F | Ht 67.0 in | Wt 180.0 lb

## 2015-04-11 DIAGNOSIS — R490 Dysphonia: Secondary | ICD-10-CM

## 2015-04-11 DIAGNOSIS — E041 Nontoxic single thyroid nodule: Secondary | ICD-10-CM | POA: Diagnosis not present

## 2015-04-11 NOTE — Progress Notes (Signed)
Pre visit review using our clinic review tool, if applicable. No additional management support is needed unless otherwise documented below in the visit note. 

## 2015-04-11 NOTE — Progress Notes (Signed)
   Subjective:    Patient ID: Julie Ward, female    DOB: 29-Sep-1996, 19 y.o.   MRN: [id number]  HPI Here asking for help with her hoarse voice and difficulty talking. She has a large thyroid nodule and is scheduled for surgery to remove it next week. 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 She asks me to give her a note excusing her from work.    Review of Systems  Constitutional: Negative.   HENT: Positive for voice change. Negative for sore throat and trouble swallowing.   Respiratory: Negative.        Objective:   Physical Exam  Constitutional: She appears well-developed and well-nourished.  HENT:  Right Ear: External ear normal.  Left Ear: External ear normal.  Nose: Nose normal.  Mouth/Throat: Oropharynx is clear and moist.  Eyes: Conjunctivae are normal.  Neck:  The thyroid is enlarged   Pulmonary/Chest: Effort normal and breath sounds normal. No respiratory distress. She has no wheezes. She has no rales.  Her voice is hoarse  Lymphadenopathy:    She has no cervical adenopathy.          Assessment & Plan:  She cannot work due to hoarseness so we wrote her a note excusing her from work from 04-10-15 until 04-21-15. After her surgery we will leave it up to her surgeon to determine when she could return to work

## 2015-04-15 ENCOUNTER — Telehealth: Payer: Self-pay | Admitting: Internal Medicine

## 2015-04-15 NOTE — Telephone Encounter (Signed)
Pt coming in to see Dr Cruzita Lederer on Tuesday.

## 2015-04-15 NOTE — Telephone Encounter (Signed)
Pt called wanting to be worked in today to see DR. Gherghe to talk about the up coming surgery possibly this week, we have no openings for pt to be worked in. Can Dr. Cruzita Lederer call and speak with pt on her concerns.

## 2015-04-16 ENCOUNTER — Telehealth: Payer: Self-pay | Admitting: Internal Medicine

## 2015-04-16 ENCOUNTER — Encounter: Payer: Self-pay | Admitting: Internal Medicine

## 2015-04-16 ENCOUNTER — Ambulatory Visit (INDEPENDENT_AMBULATORY_CARE_PROVIDER_SITE_OTHER): Payer: BLUE CROSS/BLUE SHIELD | Admitting: Internal Medicine

## 2015-04-16 VITALS — BP 114/68 | HR 94 | Temp 98.8°F | Resp 12 | Wt 179.8 lb

## 2015-04-16 DIAGNOSIS — R946 Abnormal results of thyroid function studies: Secondary | ICD-10-CM | POA: Diagnosis not present

## 2015-04-16 DIAGNOSIS — E041 Nontoxic single thyroid nodule: Secondary | ICD-10-CM

## 2015-04-16 DIAGNOSIS — R7989 Other specified abnormal findings of blood chemistry: Secondary | ICD-10-CM

## 2015-04-16 LAB — TSH: TSH: 3.15 u[IU]/mL (ref 0.35–5.50)

## 2015-04-16 LAB — T3, FREE: T3, Free: 3.6 pg/mL (ref 2.3–4.2)

## 2015-04-16 LAB — T4, FREE: Free T4: 0.71 ng/dL (ref 0.60–1.60)

## 2015-04-16 NOTE — Progress Notes (Signed)
Patient ID: Julie Ward, female   DOB: [birth year], 19 y.o.   MRN: [id number]   HPI  Julie Ward is a 19 y.o.-year-old female, returning for follow-up for subclinical hyperthyroidism and goiter with a large L thyroid nodule. She is accompanied by her mother and grandmother, who offer part of the history. Last visit 2.5 mo ago.  Reviewed hx:  She had a sore throat in 01/2014 >> saw PCP >> noted to have a goiter. She was sent for a thyroid U/S:  L thyroid nodule: Thyroid U/S (06/06/2014): L dominant 53 x 35 x 42 mm complex mostly solid mass, inferior pole. Several other smaller  thyroid nodules, largest 6x7 mm.  A FNA of the dominant L thyroid nodule was benign.  At last visit, she again complained of + neck compression sxs: no dysphagia but sore throat and odynophagia, no hoarseness, + worsening of choking.  A barium swallow test returned positive for esophageal compression from the thyroid nodule: 04/08/2015: There is deviation of the tracheal air shadow to the right of midline. Rapid sequence spot films show deviation of the lower cervical and upper thoracic esophagus as well to the right of midline presumably due to the large left thyroid nodule. No mucosal irregularity is seen at that site. Esophageal peristalsis is normal. No hiatal hernia is seen. No gastroesophageal reflux is demonstrated. A barium pill passed into the stomach at the end of this study without delay.  IMPRESSION: 1. There is deviation of the lower cervical and upper thoracic esophagus as well as the adjacent trachea to the right of midline presumably by the known large left thyroid nodule. 2. No hiatal hernia. No gastroesophageal reflux.  In the context of the above imaging findings (both Th and Es deviated 2/2 large L thyroid nodule) and due to her neck compression sxs >> I referred her to Sx for L hemithyroidectomy.  She apparently has a balance due with [facility] >> will call them and  inquire.  Subclinical Hyperthyroidism: Discovered incidentally, during investigation for goiter. The TSH was minimally suppressed, and stable between the last 2 checks. At last visit, off Entocort, which is taking for Crohn's disease, the TSH was normal. We have discussed in the past that her low TSH might be due to her Entocort use. At the time of the last thyroid test she was off the Entocort for at least a month, now still off the med >> feels better.  I reviewed pt's thyroid tests: Lab Results  Component Value Date   TSH 1.95 01/16/2015   TSH 0.33* 07/19/2014   TSH 0.32* 06/06/2014   FREET4 0.85 01/16/2015   FREET4 1.01 07/19/2014   FREET4 0.94 06/06/2014     Pt c/o: - no excessive sweating/heat intolerance - no weight changes recently per her report, but she has been trying to lose weight - no tremors - + anxiety (not new) - no palpitations - + fatigue - no N/V/D/C - no weight loss/gain  ROS: Constitutional: see HPI, + poor sleep Eyes: no blurry vision, no xerophthalmia ENT: no sore throat, + nodules palpated in throat, + dysphagia and choking/no odynophagia, + hoarseness Cardiovascular: + CP/+ SOB/no palpitations/leg swelling Respiratory: no cough/+ SOB Gastrointestinal: no N/V/D/no C Musculoskeletal: no muscle/joint aches Skin: no rashes, + easy bruising Neurological: no tremors/numbness/tingling/dizziness  I reviewed pt's medications, allergies, PMH, social hx, family hx, and changes were documented in the history of present illness. Otherwise, unchanged from my initial visit note.   Past Medical History  Diagnosis Date  . GERD (gastroesophageal reflux disease)   . Allergy   . Dysmenorrhea   . Abdominal pain, recurrent   . Appendicitis   . Crohn's disease of colon 10/14    sees Dr. Blanca Friend at [facility]    Past Surgical History  Procedure Laterality Date  . Appendectomy  07-14-11    laparoscopic   . Esophagogastroduodenoscopy  01/28/2012     Procedure: ESOPHAGOGASTRODUODENOSCOPY (EGD);  Surgeon: Oletha Blend, MD;  Location: [place];  Service: Gastroenterology;  Laterality: N/A;  . Wisdom tooth extraction    . Cholecystectomy  02/11/2012    Procedure: LAPAROSCOPIC CHOLECYSTECTOMY WITH INTRAOPERATIVE CHOLANGIOGRAM;  Surgeon: Gwenyth Ober, MD;  Location: [place];  Service: General;  Laterality: N/A;   History   Social History  . Marital Status: Single    Spouse Name: N/A    Number of Children: 0   Occupational History  . student    Social History Main Topics  . Smoking status: Never Smoker   . Smokeless tobacco: Never Used  . Alcohol Use: No     Comment: none  . Drug Use: No   Current Outpatient Prescriptions on File Prior to Visit  Medication Sig Dispense Refill  . benzonatate (TESSALON) 100 MG capsule Take 1 capsule (100 mg total) by mouth every 8 (eight) hours. (Patient not taking: Reported on 04/11/2015) 21 capsule 0  . clidinium-chlordiazePOXIDE (LIBRAX) 5-2.5 MG per capsule Take 1 capsule by mouth 2 (two) times daily as needed (crohn's disease).     . [DISCONTINUED] metoCLOPramide (REGLAN) 10 MG tablet Take 1 tablet (10 mg total) by mouth every 6 (six) hours. 30 tablet 0   No current facility-administered medications on file prior to visit.   Allergies  Allergen Reactions  . Fentanyl Other (See Comments)    anxiety  . Morphine And Related     Caused stomach to burn   . Oxycontin [Oxycodone Hcl] Nausea And Vomiting    vomiting  . Penicillins Nausea Only and Other (See Comments)    Made her sick  . Prednisone Hives   Family History  Problem Relation Age of Onset  . Cancer Paternal Aunt     ovarian, kidney  . Cancer Paternal Uncle     colon  . Colon cancer Paternal Uncle   . Cancer Maternal Grandmother     breast   . Hypertension Maternal Grandmother   . Irritable bowel syndrome Maternal Grandmother   . Heart disease Maternal Grandmother   . Hypertension Paternal Grandmother   . Hyperlipidemia Paternal  Grandmother    PE: BP 114/68 mmHg  Pulse 94  Temp(Src) 98.8 F (37.1 C) (Oral)  Resp 12  Wt 179 lb 12.8 oz (81.557 kg) Body mass index is 28.15 kg/(m^2). Wt Readings from Last 3 Encounters:  04/16/15 179 lb 12.8 oz (81.557 kg)  04/11/15 180 lb (81.647 kg)  01/27/15 172 lb (78.019 kg)   Constitutional: overweight, in NAD Eyes: PERRLA, EOMI, mild exophthalmos, no lid lag, no stare ENT: moist mucous membranes, + thyromegaly L>R, no cervical lymphadenopathy Cardiovascular: tachycardia, RR, No MRG Respiratory: CTA B Gastrointestinal: abdomen soft, NT, ND, BS+ Musculoskeletal: no deformities, strength intact in all 4 Skin: moist, warm, no rashes Neurological: no tremor with outstretched hands, DTR normal in all 4  ASSESSMENT: 1. Subclinical hyperthyroidism  2. MNG - large L thyroid nodule: Thyroid U/S (06/06/2014):  Right thyroid lobe Measurements: 67 x 22 x 21 mm. Inhomogeneous background echotexture. Multiple small hypo echo regions.  Several small nodules, largest 7 x 6 x 7 mm, mid lobe.   Left thyroid lobe Measurements: 70 x 38 x 46 mm. There is a dominant 53 x 35 x 42 mm complex mostly solid mass, inferior pole. There is a small adjacent 6 x 7 mm hypoechoic nodule, mid lobe.   Isthmus Thickness: 4 mm. 13 x 3 x 10 mm cyst on the right.   Lymphadenopathy None visualized.  06/11/2014: FNA L dominant nodule: benign  PLAN: 1. MNG  - We reviewed the report of her latest thyroid ultrasound and barium swallow test along with the patient and her grandmother. The left thyroid nodule is large, benign, but causing compression of her trachea and esophagus. Since she has neck compression symptoms (choking), I suggested a hemithyroidectomy (lobectomy) or total thyroidectomy. She apparently has a balance due with [facility] >> will call and inquire. - We discussed about the possibility of her needing thyroid hormone therapy after the hemithyroidectomy - We will need to check  TFTs 1.5-2 months after her surgery. - I'll see her back in 4 mo, but we will stay in touch about the results  2. Patient with 2 low TSH levels, with normal free thyroid hormones, without thyrotoxic sxs. She does have anxiety, which is not new. She has weight gain + loss, depending on her Crohn's ds activity, she mentions. - We again discussed that her subclinical hyperthyroidism was very mild, and probably do to previous Entocort use. - Today, we will check TSH, fT3 and fT4  -  RTC in 4 mo  Office Visit on 04/16/2015  Component Date Value Ref Range Status  . TSH 04/16/2015 3.15  0.35 - 5.50 uIU/mL Final  . Free T4 04/16/2015 0.71  0.60 - 1.60 ng/dL Final  . T3, Free 04/16/2015 3.6  2.3 - 4.2 pg/mL Final   TFTs normal.

## 2015-04-16 NOTE — Patient Instructions (Addendum)
Please stop at the lab.  Please come back for labs 1.5 months after the surgery.  Please return in 4 months with your sugar log.

## 2015-04-16 NOTE — Telephone Encounter (Signed)
Pt calling with concerns that [facility] cannot get her in until 04/29/15. States before this week [facility] had an appt for her that was cancelled at a sooner slot but now 04/29/15 is the soonest they can get her in.  Called Jessica at [facility] Pt has paid off her balance, Janett Billow did offer the pt the 04/29/15 slot which the pt did not take. I asked Janett Billow if there was an appt previously set for this pt to be seen sooner and Janett Billow stated she has not scheduled this pt in a while due to the pending balance she had with them.  Pt would like [place] to try to get back with her today if possible regarding getting her a sooner appt, she is wanting to see if Dr. Cruzita Lederer can speak with someone at [facility].  Please advise

## 2015-04-17 NOTE — Telephone Encounter (Signed)
Patient is returning your call.  

## 2015-04-17 NOTE — Telephone Encounter (Signed)
Returned pt's call. Advised her that we have scheduled her an appt with Dr Hulen Skains on 04/22/15 at 9:00 am. Per Lysbeth Galas at [facility], pt has to see Dr Hulen Skains. There policy is that if the pt has been seen in the last 3 yrs, they must see the physician who they saw previously. Pt ok with seeing Dr Hulen Skains. Be advised.

## 2015-04-22 ENCOUNTER — Telehealth: Payer: Self-pay | Admitting: Internal Medicine

## 2015-04-22 ENCOUNTER — Other Ambulatory Visit: Payer: Self-pay | Admitting: General Surgery

## 2015-04-22 ENCOUNTER — Ambulatory Visit
Admission: RE | Admit: 2015-04-22 | Discharge: 2015-04-22 | Disposition: A | Discharge status: Home / Self Care | Payer: BLUE CROSS/BLUE SHIELD | Source: Ambulatory Visit | Attending: General Surgery | Admitting: General Surgery

## 2015-04-22 DIAGNOSIS — E041 Nontoxic single thyroid nodule: Secondary | ICD-10-CM

## 2015-04-22 MED ORDER — IOPAMIDOL (ISOVUE-300) INJECTION 61%
75.0000 mL | Freq: Once | INTRAVENOUS | Status: AC | PRN
  Administered 2015-04-22: 75 mL via INTRAVENOUS

## 2015-04-22 NOTE — Telephone Encounter (Signed)
OK, we can give her a letter until next week - can you please write it and I will sign? Thank you,.

## 2015-04-22 NOTE — Telephone Encounter (Signed)
Pt is calling to see if we can write her out of work for a longer time. PCP wrote her out previously

## 2015-04-22 NOTE — Telephone Encounter (Signed)
When is her surgery?  She should not be out of work from the thyroid pov. From anxiety pov, this will need to come from PCP.

## 2015-04-22 NOTE — Telephone Encounter (Signed)
Please read message below and advise.

## 2015-04-22 NOTE — Telephone Encounter (Signed)
Called pt and advised her per Dr Arman Filter message below. Pt stated that she is having trouble talking a lot, she gets choked up. She said her job requires her to be on the phone all day. Pt stated she sees the surgeon next week. Please advise.

## 2015-04-23 ENCOUNTER — Encounter: Payer: Self-pay | Admitting: *Deleted

## 2015-04-23 NOTE — Telephone Encounter (Signed)
Letter signed and faxed to her work: [phone].

## 2015-04-23 NOTE — Telephone Encounter (Signed)
Done

## 2015-04-23 NOTE — Telephone Encounter (Signed)
Letter routed to Dr Cruzita Lederer for her to edit.

## 2015-04-28 ENCOUNTER — Encounter (HOSPITAL_COMMUNITY): Payer: Self-pay | Admitting: *Deleted

## 2015-04-29 ENCOUNTER — Encounter (HOSPITAL_COMMUNITY)
Admission: RE | Disposition: A | Discharge status: Home / Self Care | Payer: Self-pay | Source: Ambulatory Visit | Attending: General Surgery

## 2015-04-29 ENCOUNTER — Ambulatory Visit (HOSPITAL_COMMUNITY): Payer: BLUE CROSS/BLUE SHIELD | Admitting: Certified Registered Nurse Anesthetist

## 2015-04-29 ENCOUNTER — Ambulatory Visit (HOSPITAL_COMMUNITY)
Admission: RE | Admit: 2015-04-29 | Discharge: 2015-04-30 | Disposition: A | Discharge status: Home / Self Care | Payer: BLUE CROSS/BLUE SHIELD | Source: Ambulatory Visit | Attending: General Surgery | Admitting: General Surgery

## 2015-04-29 ENCOUNTER — Encounter (HOSPITAL_COMMUNITY): Payer: Self-pay | Admitting: *Deleted

## 2015-04-29 ENCOUNTER — Ambulatory Visit (HOSPITAL_COMMUNITY): Payer: BLUE CROSS/BLUE SHIELD

## 2015-04-29 DIAGNOSIS — E063 Autoimmune thyroiditis: Secondary | ICD-10-CM | POA: Insufficient documentation

## 2015-04-29 DIAGNOSIS — E041 Nontoxic single thyroid nodule: Secondary | ICD-10-CM | POA: Diagnosis present

## 2015-04-29 DIAGNOSIS — Z01818 Encounter for other preprocedural examination: Secondary | ICD-10-CM

## 2015-04-29 DIAGNOSIS — K219 Gastro-esophageal reflux disease without esophagitis: Secondary | ICD-10-CM | POA: Diagnosis not present

## 2015-04-29 DIAGNOSIS — Z885 Allergy status to narcotic agent status: Secondary | ICD-10-CM | POA: Diagnosis not present

## 2015-04-29 DIAGNOSIS — Z888 Allergy status to other drugs, medicaments and biological substances status: Secondary | ICD-10-CM | POA: Diagnosis not present

## 2015-04-29 DIAGNOSIS — D34 Benign neoplasm of thyroid gland: Secondary | ICD-10-CM | POA: Diagnosis not present

## 2015-04-29 DIAGNOSIS — E049 Nontoxic goiter, unspecified: Secondary | ICD-10-CM | POA: Diagnosis present

## 2015-04-29 DIAGNOSIS — K5090 Crohn's disease, unspecified, without complications: Secondary | ICD-10-CM | POA: Insufficient documentation

## 2015-04-29 DIAGNOSIS — Z880 Allergy status to penicillin: Secondary | ICD-10-CM | POA: Insufficient documentation

## 2015-04-29 HISTORY — DX: Anxiety disorder, unspecified: F41.9

## 2015-04-29 HISTORY — DX: Reserved for inherently not codable concepts without codable children: IMO0001

## 2015-04-29 HISTORY — PX: THYROID LOBECTOMY: SHX420

## 2015-04-29 LAB — GLUCOSE, CAPILLARY: GLUCOSE-CAPILLARY: 119 mg/dL — AB (ref 65–99)

## 2015-04-29 LAB — CBC
HCT: 38.5 % (ref 36.0–46.0)
Hemoglobin: 13.3 g/dL (ref 12.0–15.0)
MCH: 30.4 pg (ref 26.0–34.0)
MCHC: 34.5 g/dL (ref 30.0–36.0)
MCV: 87.9 fL (ref 78.0–100.0)
PLATELETS: 202 10*3/uL (ref 150–400)
RBC: 4.38 MIL/uL (ref 3.87–5.11)
RDW: 12 % (ref 11.5–15.5)
WBC: 3.2 10*3/uL — AB (ref 4.0–10.5)

## 2015-04-29 LAB — HCG, SERUM, QUALITATIVE: Preg, Serum: NEGATIVE

## 2015-04-29 SURGERY — LOBECTOMY, THYROID
Anesthesia: General | Laterality: Left

## 2015-04-29 MED ORDER — FENTANYL CITRATE (PF) 250 MCG/5ML IJ SOLN
INTRAMUSCULAR | Status: AC
  Filled 2015-04-29: qty 5

## 2015-04-29 MED ORDER — DEXMEDETOMIDINE HCL IN NACL 200 MCG/50ML IV SOLN
INTRAVENOUS | Status: AC
  Filled 2015-04-29: qty 50

## 2015-04-29 MED ORDER — PROPOFOL 10 MG/ML IV BOLUS
INTRAVENOUS | Status: DC | PRN
  Administered 2015-04-29: 200 mg via INTRAVENOUS

## 2015-04-29 MED ORDER — NEOSTIGMINE METHYLSULFATE 10 MG/10ML IV SOLN
INTRAVENOUS | Status: DC | PRN
  Administered 2015-04-29: 1 mg via INTRAVENOUS
  Administered 2015-04-29: 2 mg via INTRAVENOUS

## 2015-04-29 MED ORDER — ONDANSETRON HCL 4 MG/2ML IJ SOLN
4.0000 mg | Freq: Four times a day (QID) | INTRAMUSCULAR | Status: DC | PRN
  Filled 2015-04-29: qty 2

## 2015-04-29 MED ORDER — FENTANYL CITRATE (PF) 100 MCG/2ML IJ SOLN
INTRAMUSCULAR | Status: DC | PRN
  Administered 2015-04-29 (×4): 50 ug via INTRAVENOUS

## 2015-04-29 MED ORDER — ACETAMINOPHEN 325 MG PO TABS: 650.0000 mg | ORAL_TABLET | ORAL | Status: DC | PRN

## 2015-04-29 MED ORDER — HYDROMORPHONE HCL 1 MG/ML IJ SOLN
0.5000 mg | INTRAMUSCULAR | Status: DC | PRN
  Administered 2015-04-29 – 2015-04-30 (×2): 1 mg via INTRAVENOUS
  Administered 2015-04-30 (×2): 0.5 mg via INTRAVENOUS
  Filled 2015-04-29 (×4): qty 1

## 2015-04-29 MED ORDER — MIDAZOLAM HCL 2 MG/2ML IJ SOLN
INTRAMUSCULAR | Status: AC
  Filled 2015-04-29: qty 2

## 2015-04-29 MED ORDER — DEXMEDETOMIDINE HCL 200 MCG/2ML IV SOLN
INTRAVENOUS | Status: DC | PRN
  Administered 2015-04-29 (×3): 4 ug via INTRAVENOUS

## 2015-04-29 MED ORDER — GLYCOPYRROLATE 0.2 MG/ML IJ SOLN
INTRAMUSCULAR | Status: DC | PRN
  Administered 2015-04-29 (×2): .2 mg via INTRAVENOUS

## 2015-04-29 MED ORDER — HYDROMORPHONE HCL 1 MG/ML IJ SOLN
INTRAMUSCULAR | Status: AC
  Filled 2015-04-29: qty 1

## 2015-04-29 MED ORDER — CEFAZOLIN SODIUM-DEXTROSE 2-3 GM-% IV SOLR
INTRAVENOUS | Status: DC | PRN
  Administered 2015-04-29: 2 g via INTRAVENOUS

## 2015-04-29 MED ORDER — ONDANSETRON HCL 4 MG/2ML IJ SOLN
INTRAMUSCULAR | Status: DC | PRN
  Administered 2015-04-29 (×2): 4 mg via INTRAVENOUS

## 2015-04-29 MED ORDER — DEXAMETHASONE SODIUM PHOSPHATE 4 MG/ML IJ SOLN
INTRAMUSCULAR | Status: DC | PRN
  Administered 2015-04-29: 4 mg via INTRAVENOUS

## 2015-04-29 MED ORDER — ONDANSETRON HCL 4 MG/2ML IJ SOLN: 4.0000 mg | Freq: Once | INTRAMUSCULAR | Status: DC | PRN

## 2015-04-29 MED ORDER — ONDANSETRON HCL 4 MG PO TABS: 4.0000 mg | ORAL_TABLET | Freq: Four times a day (QID) | ORAL | Status: DC | PRN

## 2015-04-29 MED ORDER — LIDOCAINE HCL (CARDIAC) 20 MG/ML IV SOLN
INTRAVENOUS | Status: DC | PRN
  Administered 2015-04-29: 40 mg via INTRAVENOUS

## 2015-04-29 MED ORDER — HEMOSTATIC AGENTS (NO CHARGE) OPTIME
TOPICAL | Status: DC | PRN
  Administered 2015-04-29: 1 via TOPICAL

## 2015-04-29 MED ORDER — KCL IN DEXTROSE-NACL 20-5-0.45 MEQ/L-%-% IV SOLN
INTRAVENOUS | Status: DC
  Administered 2015-04-29: via INTRAVENOUS
  Filled 2015-04-29 (×4): qty 1000

## 2015-04-29 MED ORDER — POVIDONE-IODINE 10 % EX OINT
TOPICAL_OINTMENT | CUTANEOUS | Status: AC
  Filled 2015-04-29: qty 28.35

## 2015-04-29 MED ORDER — PROPOFOL 10 MG/ML IV BOLUS
INTRAVENOUS | Status: AC
  Filled 2015-04-29: qty 20

## 2015-04-29 MED ORDER — GLYCOPYRROLATE 0.2 MG/ML IJ SOLN
INTRAMUSCULAR | Status: AC
  Filled 2015-04-29: qty 2

## 2015-04-29 MED ORDER — HYDROMORPHONE HCL 1 MG/ML IJ SOLN
0.2500 mg | INTRAMUSCULAR | Status: DC | PRN
  Administered 2015-04-29 (×2): 0.5 mg via INTRAVENOUS

## 2015-04-29 MED ORDER — LACTATED RINGERS IV SOLN
INTRAVENOUS | Status: DC
  Administered 2015-04-29 (×2): via INTRAVENOUS

## 2015-04-29 MED ORDER — ROCURONIUM BROMIDE 100 MG/10ML IV SOLN
INTRAVENOUS | Status: DC | PRN
  Administered 2015-04-29: 50 mg via INTRAVENOUS

## 2015-04-29 MED ORDER — MIDAZOLAM HCL 5 MG/5ML IJ SOLN
INTRAMUSCULAR | Status: DC | PRN
  Administered 2015-04-29: 2 mg via INTRAVENOUS

## 2015-04-29 SURGICAL SUPPLY — 53 items
BLADE SURG 10 STRL SS (BLADE) ×2 IMPLANT
BLADE SURG 15 STRL LF DISP TIS (BLADE) IMPLANT
BLADE SURG 15 STRL SS (BLADE) ×3
CANISTER SUCTION 2500CC (MISCELLANEOUS) ×3 IMPLANT
CHLORAPREP W/TINT 26ML (MISCELLANEOUS) ×3 IMPLANT
CLIP TI MEDIUM 24 (CLIP) ×3 IMPLANT
CLIP TI WIDE RED SMALL 24 (CLIP) ×3 IMPLANT
CLOSURE WOUND 1/4X4 (GAUZE/BANDAGES/DRESSINGS) ×1
CONT SPEC 4OZ CLIKSEAL STRL BL (MISCELLANEOUS) IMPLANT
COVER SURGICAL LIGHT HANDLE (MISCELLANEOUS) ×3 IMPLANT
CRADLE DONUT ADULT HEAD (MISCELLANEOUS) ×3 IMPLANT
DRAPE PED LAPAROTOMY (DRAPES) ×3 IMPLANT
DRAPE UTILITY XL STRL (DRAPES) ×6 IMPLANT
ELECT CAUTERY BLADE 6.4 (BLADE) ×3 IMPLANT
ELECT REM PT RETURN 9FT ADLT (ELECTROSURGICAL) ×3
ELECTRODE REM PT RTRN 9FT ADLT (ELECTROSURGICAL) ×1 IMPLANT
GAUZE SPONGE 4X4 12PLY STRL (GAUZE/BANDAGES/DRESSINGS) ×3 IMPLANT
GAUZE SPONGE 4X4 16PLY XRAY LF (GAUZE/BANDAGES/DRESSINGS) ×5 IMPLANT
GLOVE BIOGEL PI IND STRL 8 (GLOVE) ×1 IMPLANT
GLOVE BIOGEL PI INDICATOR 8 (GLOVE) ×4
GLOVE ECLIPSE 7.5 STRL STRAW (GLOVE) ×3 IMPLANT
GLOVE SURG ORTHO 8.0 STRL STRW (GLOVE) ×2 IMPLANT
GLOVE SURG SS PI 7.5 STRL IVOR (GLOVE) ×2 IMPLANT
GOWN STRL REUS W/ TWL LRG LVL3 (GOWN DISPOSABLE) ×3 IMPLANT
GOWN STRL REUS W/TWL LRG LVL3 (GOWN DISPOSABLE) ×9
HEMOSTAT SURGICEL 2X14 (HEMOSTASIS) ×5 IMPLANT
KIT BASIN OR (CUSTOM PROCEDURE TRAY) ×3 IMPLANT
KIT ROOM TURNOVER OR (KITS) ×3 IMPLANT
NS IRRIG 1000ML POUR BTL (IV SOLUTION) ×3 IMPLANT
PACK SURGICAL SETUP 50X90 (CUSTOM PROCEDURE TRAY) ×3 IMPLANT
PAD ARMBOARD 7.5X6 YLW CONV (MISCELLANEOUS) ×6 IMPLANT
PENCIL BUTTON HOLSTER BLD 10FT (ELECTRODE) ×3 IMPLANT
SHEARS HARMONIC 9CM CVD (BLADE) ×2 IMPLANT
SPECIMEN JAR MEDIUM (MISCELLANEOUS) IMPLANT
SPONGE INTESTINAL PEANUT (DISPOSABLE) ×3 IMPLANT
STAPLER VISISTAT 35W (STAPLE) IMPLANT
STRIP CLOSURE SKIN 1/4X4 (GAUZE/BANDAGES/DRESSINGS) ×2 IMPLANT
SUT ETHILON 5 0 P 3 18 (SUTURE) ×2
SUT NYLON ETHILON 5-0 P-3 1X18 (SUTURE) ×1 IMPLANT
SUT SILK 0 FSL (SUTURE) ×3 IMPLANT
SUT SILK 2 0 SH (SUTURE) ×3 IMPLANT
SUT SILK 3 0 (SUTURE) ×3
SUT SILK 3-0 18XBRD TIE 12 (SUTURE) ×1 IMPLANT
SUT VIC AB 3-0 SH 18 (SUTURE) ×5 IMPLANT
SUT VICRYL AB 3 0 TIES (SUTURE) ×3 IMPLANT
SUT VICRYL AB 4 0 18 (SUTURE) IMPLANT
SYR BULB 3OZ (MISCELLANEOUS) ×3 IMPLANT
TAPE CLOTH SURG 4X10 WHT LF (GAUZE/BANDAGES/DRESSINGS) ×2 IMPLANT
TOWEL OR 17X24 6PK STRL BLUE (TOWEL DISPOSABLE) ×3 IMPLANT
TOWEL OR 17X26 10 PK STRL BLUE (TOWEL DISPOSABLE) ×3 IMPLANT
TUBE CONNECTING 12'X1/4 (SUCTIONS) ×1
TUBE CONNECTING 12X1/4 (SUCTIONS) ×2 IMPLANT
WATER STERILE IRR 1000ML POUR (IV SOLUTION) IMPLANT

## 2015-04-29 NOTE — H&P (Signed)
Julie Ward 04/22/2015 9:35 AM Location: [facility] Patient #: [id number] DOB: 17-May-1996 Single / Language: Julie Ward Molt / Race: Black or African American Female   The patient is a 19 year old female    Other Problems Elbert Ewings, [place]; 04/22/2015 9:36 AM) Back Pain Crohn's Disease Gastroesophageal Reflux Disease Thyroid Disease  Past Surgical History Elbert Ewings, CMA; 04/22/2015 9:36 AM) Appendectomy Gallbladder Surgery - Laparoscopic  Diagnostic Studies History Elbert Ewings, CMA; 04/22/2015 9:36 AM) Colonoscopy 1-5 years ago Mammogram never Pap Smear 1-5 years ago  Allergies Elbert Ewings, CMA; 04/22/2015 9:36 AM) No Known Drug Allergies06/05/2015  Medication History Elbert Ewings, CMA; 04/22/2015 9:36 AM) No Current Medications Medications Reconciled  Social History Elbert Ewings, CMA; 04/22/2015 9:36 AM) Alcohol use Occasional alcohol use. Caffeine use Carbonated beverages. No drug use Tobacco use Never smoker.  Family History Elbert Ewings, [place]; 04/22/2015 9:36 AM) Heart disease in female family member before age 59  Pregnancy / Birth History Elbert Ewings, [place]; 04/22/2015 9:36 AM) Age at menarche 70 years. Gravida 0 Para 0 Regular periods  Review of Systems Elbert Ewings CMA; 04/22/2015 9:36 AM) General Not Present- Appetite Loss, Chills, Fatigue, Fever, Night Sweats, Weight Gain and Weight Loss. Skin Not Present- Change in Wart/Mole, Dryness, Hives, Jaundice, New Lesions, Non-Healing Wounds, Rash and Ulcer. HEENT Present- Hoarseness. Not Present- Earache, Hearing Loss, Nose Bleed, Oral Ulcers, Ringing in the Ears, Seasonal Allergies, Sinus Pain, Sore Throat, Visual Disturbances, Wears glasses/contact lenses and Yellow Eyes. Respiratory Present- Difficulty Breathing. Not Present- Bloody sputum, Chronic Cough, Snoring and Wheezing. Breast Not Present- Breast Mass, Breast Pain, Nipple Discharge and Skin Changes. Cardiovascular Present- Shortness of  Breath. Not Present- Chest Pain, Difficulty Breathing Lying Down, Leg Cramps, Palpitations, Rapid Heart Rate and Swelling of Extremities. Gastrointestinal Not Present- Abdominal Pain, Bloating, Bloody Stool, Change in Bowel Habits, Chronic diarrhea, Constipation, Difficulty Swallowing, Excessive gas, Gets full quickly at meals, Hemorrhoids, Indigestion, Nausea, Rectal Pain and Vomiting. Female Genitourinary Not Present- Frequency, Nocturia, Painful Urination, Pelvic Pain and Urgency. Musculoskeletal Present- Back Pain. Not Present- Joint Pain, Joint Stiffness, Muscle Pain, Muscle Weakness and Swelling of Extremities. Neurological Not Present- Decreased Memory, Fainting, Headaches, Numbness, Seizures, Tingling, Tremor, Trouble walking and Weakness. Psychiatric Not Present- Anxiety, Bipolar, Change in Sleep Pattern, Depression, Fearful and Frequent crying. Endocrine Not Present- Cold Intolerance, Excessive Hunger, Hair Changes, Heat Intolerance, Hot flashes and New Diabetes. Hematology Not Present- Easy Bruising, Excessive bleeding, Gland problems, HIV and Persistent Infections.   Vitals Elbert Ewings CMA; 04/22/2015 9:37 AM) 04/22/2015 9:36 AM Weight: 180 lb Height: 67in Body Surface Area: 1.96 m Body Mass Index: 28.19 kg/m Temp.: 98.47F(Oral)  Resp.: 17 (Unlabored)  BP: 128/78 (Sitting, Left Arm, Standard)    Physical Exam (Milina Pagett O. Shaka Zech MD; 04/22/2015 10:03 AM) Head and Neck Thyroid Gland Characteristics - enlarged, tender, asymmetric, smooth and boggy. Note: Large left thyroind mass/goiter.. Her trachea and esophagus are deviated to the right. Has not had a CT scan to visualize this yet.     Assessment & Plan Jeneen Rinks O. Osiris Charles MD; 04/22/2015 10:02 AM) THYROID NODULE (241.0  E04.1) Impression: 4-5 cm left thyroid nodule, massive asymmetric goiter. Rubbery, tender, causing tracheal and esophageal deviation. Will need CT scan and then likely schedule for surgery next week, possible  Thursday. Current Plans:  Patient had a CT of her neck folowing her clinic appointment and this confirms the presence of a 4x4 cm left thyroid, multinodular and cystic goiter with tracheal and esophageal compression.  Prior biopsies have been  negatvie for malignancy.  For total left thyroid lobectomy today.

## 2015-04-29 NOTE — Interval H&P Note (Signed)
History and Physical Interval Note: Left side marked.  Family present.  No additional complaints or concerns. 04/29/2015 2:19 PM  Julie Ward  has presented today for surgery, with the diagnosis of left thyroid nodule, massive asymmetric goiter  The various methods of treatment have been discussed with the patient and family. After consideration of risks, benefits and other options for treatment, the patient has consented to  Procedure(s): LEFT TOTAL THYROID LOBECTOMY (Left) as a surgical intervention .  The patient's history has been reviewed, patient examined, no change in status, stable for surgery.  I have reviewed the patient's chart and labs.  Questions were answered to the patient's satisfaction.     Aphrodite Harpenau

## 2015-04-29 NOTE — Transfer of Care (Signed)
Immediate Anesthesia Transfer of Care Note  Patient: Julie Ward  Procedure(s) Performed: Procedure(s): LEFT TOTAL THYROID LOBECTOMY (Left)  Patient Location: PACU  Anesthesia Type:General  Level of Consciousness: sedated  Airway & Oxygen Therapy: Patient Spontanous Breathing  Post-op Assessment: Report given to RN and Post -op Vital signs reviewed and stable  Post vital signs: Reviewed and stable  Last Vitals:  Filed Vitals:   04/29/15 1637  BP: 114/53  Pulse: 73  Temp: 36.9 C  Resp: 19    Complications: No apparent anesthesia complications   Pt 24% SPO2 on room air. No stridor. Comfortable. HR 73, BP 114/53.

## 2015-04-29 NOTE — Anesthesia Postprocedure Evaluation (Signed)
  Anesthesia Post-op Note  Patient: Julie Ward  Procedure(s) Performed: Procedure(s): LEFT TOTAL THYROID LOBECTOMY (Left)  Patient Location: PACU  Anesthesia Type:General  Level of Consciousness: awake, alert  and oriented  Airway and Oxygen Therapy: Patient Spontanous Breathing and Patient connected to nasal cannula oxygen  Post-op Pain: mild  Post-op Assessment: Post-op Vital signs reviewed, Patient's Cardiovascular Status Stable, Respiratory Function Stable, Patent Airway and Pain level controlled              Post-op Vital Signs: stable  Last Vitals:  Filed Vitals:   04/29/15 1821  BP: 126/83  Pulse:   Temp: 36.5 C  Resp: 15    Complications: No apparent anesthesia complications

## 2015-04-29 NOTE — Anesthesia Procedure Notes (Signed)
Procedure Name: Intubation Date/Time: 04/29/2015 2:58 PM Performed by: Merdis Delay Pre-anesthesia Checklist: Patient identified, Timeout performed, Emergency Drugs available, Suction available and Patient being monitored Patient Re-evaluated:Patient Re-evaluated prior to inductionOxygen Delivery Method: Circle system utilized Preoxygenation: Pre-oxygenation with 100% oxygen Intubation Type: IV induction Ventilation: Mask ventilation without difficulty and Oral airway inserted - appropriate to patient size Laryngoscope Size: Mac and 3 Grade View: Grade I Tube size: 7.0 mm Number of attempts: 1 Airway Equipment and Method: Stylet Placement Confirmation: ETT inserted through vocal cords under direct vision,  breath sounds checked- equal and bilateral,  positive ETCO2 and CO2 detector Secured at: 23 cm Tube secured with: Tape Dental Injury: Teeth and Oropharynx as per pre-operative assessment

## 2015-04-29 NOTE — Anesthesia Preprocedure Evaluation (Signed)
Anesthesia Evaluation  Patient identified by MRN, date of birth, ID band Patient awake    Reviewed: Allergy & Precautions, NPO status , Patient's Chart, lab work & pertinent test results  Airway Mallampati: II  TM Distance: >3 FB Neck ROM: Full    Dental  (+) Teeth Intact, Dental Advisory Given   Pulmonary  breath sounds clear to auscultation        Cardiovascular Rhythm:Regular Rate:Normal     Neuro/Psych    GI/Hepatic   Endo/Other    Renal/GU      Musculoskeletal   Abdominal   Peds  Hematology   Anesthesia Other Findings   Reproductive/Obstetrics                             Anesthesia Physical Anesthesia Plan  ASA: II  Anesthesia Plan: General   Post-op Pain Management:    Induction: Intravenous  Airway Management Planned: Oral ETT  Additional Equipment:   Intra-op Plan:   Post-operative Plan:   Informed Consent: I have reviewed the patients History and Physical, chart, labs and discussed the procedure including the risks, benefits and alternatives for the proposed anesthesia with the patient or authorized representative who has indicated his/her understanding and acceptance.   Dental advisory given  Plan Discussed with: CRNA and Anesthesiologist  Anesthesia Plan Comments:         Anesthesia Quick Evaluation

## 2015-04-29 NOTE — Op Note (Signed)
OPERATIVE REPORT  DATE OF OPERATION: 04/29/2015  PATIENT:  Julie Ward  19 y.o. female  PRE-OPERATIVE DIAGNOSIS:  left thyroid nodule, massive asymmetric goiter  POST-OPERATIVE DIAGNOSIS:  left thyroid nodule, massive asymmetric goiter  PROCEDURE:  Procedure(s): LEFT TOTAL THYROID LOBECTOMY  SURGEON:  Surgeon(s): Judeth Horn, MD Armandina Gemma, MD  ASSISTANT: Harlow Asa, M.D.  ANESTHESIA:   general  EBL: <20 ml  BLOOD ADMINISTERED: none  DRAINS: none   SPECIMEN:  Source of Specimen:  Left thyroid gland  COUNTS CORRECT:  YES  PROCEDURE DETAILS: The patient was taken to the operating room and placed on the table in the supine position. After an adequate general endotracheal anesthesia was administered she was prepped and draped in the usual sterile manner exposing her neck.  A proper timeout was performed identifying the patient and procedure to be performed. The left side of the patient's body of been marked for the left thyroid lobe that was to be removed.  The area of the incision was marked and measured. A #10 blade was used to make the initial incision transversely across the neck. It was taken down to and through the subcutaneous tissue into the platysmas muscle which was divided down to the strap muscles.  Superior and inferior flaps were made using electrocautery. On the right side the inferior anterior jugular vein was isolated and ligated between 3-0 silk ties.  Once the flaps were made a self-retaining retractor was placed. We dissected between the strap muscles down to the thyroid gland in the midline most of which appeared to be the left thyroid lobe which is markedly enlarged. Once we had dissected the strap muscles from the anterior portion of the thyroid on the left side we began with blunt and sharp dissection using a Harmonic Scalpel, a right angle clamp, and electrocautery.  The middle thyroid vein was initially isolated and taken between a Hemoclip and the  harmonic scalpel. We then dissected out the strap muscles from the superior lobe and then isolated the superior vessels using right angle clamp and medium size hemoclips and on the gland side the harmonic scalpel. Care was taken to stay very close to the thyroid gland in the superior pole during this dissection. This allowed [place] to rotate the gland medially and inferiorly as we took down more areolar connections and smaller veins closer to the lateral and posterior aspect.  We dissected away what appeared to be fibers from the recurrent laryngeal nerve on the left side away from the body of the gland on the lateral posterior aspect. While staying close to the gland again the harmonic scalpel was used to take down smaller vessels.  The pyramidal lobe was isolated superiorly and between hemoclips and the harmonic scalpel was taken down in order to rotate the again completely inferiorly. The inferior vessels were taken between right angle clamp, hemoclips, and the harmonic scalpel. As he rotated the gland more medially as all the lateral structures were pushed away we got to the trachea and dissected the gland away from the anterior lateral aspect of the trachea using blunt dissection and electrocautery. Once we got to the isthmus we came across it with the harmonic scalpel.  The gland was marked with a suture at the superior pole. There was minimal bleeding. 2 pieces of Surgicel were placed in the cavity where the gland had been removed. We irrigated with saline then closed. The strap muscles were reapproximated using figure-of-eight stitches of 3-0 Vicryl. The platysmas muscle was subsequently reapproximated  using interrupted simple stitches of 3-0 Vicryl. Once this was done the skin was closed using interrupted simple stitches of 5-0 nylon. A sterile dressing was applied including Betadine ointment and 4 x 4's.  All needle counts, sponge counts, then instrument counts were correct.  PATIENT DISPOSITION:   PACU - hemodynamically stable.   Julie Ward 6/14/20164:29 PM

## 2015-04-30 ENCOUNTER — Encounter (HOSPITAL_COMMUNITY): Payer: Self-pay | Admitting: General Surgery

## 2015-04-30 DIAGNOSIS — D34 Benign neoplasm of thyroid gland: Secondary | ICD-10-CM | POA: Diagnosis not present

## 2015-04-30 MED ORDER — DIPHENHYDRAMINE HCL 12.5 MG/5ML PO ELIX
25.0000 mg | ORAL_SOLUTION | Freq: Once | ORAL | Status: AC
  Administered 2015-04-30: 25 mg via ORAL
  Filled 2015-04-30: qty 10

## 2015-04-30 MED ORDER — TRAMADOL 5 MG/ML ORAL SUSPENSION: 25.0000 mg | Freq: Four times a day (QID) | ORAL | Status: DC | PRN

## 2015-04-30 MED ORDER — MENTHOL 3 MG MT LOZG
1.0000 | LOZENGE | OROMUCOSAL | Status: DC | PRN
  Filled 2015-04-30: qty 9

## 2015-04-30 MED ORDER — MENTHOL 3 MG MT LOZG: 1.0000 | LOZENGE | OROMUCOSAL | Status: DC | PRN

## 2015-04-30 MED ORDER — SODIUM CHLORIDE 0.9 % IJ SOLN: 3.0000 mL | INTRAMUSCULAR | Status: DC | PRN

## 2015-04-30 MED ORDER — TRAMADOL HCL 50 MG PO TABS: 50.0000 mg | ORAL_TABLET | Freq: Four times a day (QID) | ORAL | Status: DC | PRN

## 2015-04-30 NOTE — Discharge Instructions (Signed)
Thyroidectomy Care After Refer to this sheet in the next few weeks. These instructions provide you with general information on caring for yourself after you leave the hospital. Your caregiver also may give you specific instructions. Your treatment has been planned according to the most current medical practices available, but problems sometimes occur. Call your caregiver if you have any problems or questions after your procedure. HOME CARE INSTRUCTIONS   It is normal to be sore for a few weeks following surgery. See your caregiver if your pain seems to be getting worse rather than better.  Only take over-the-counter or prescription medicines for pain, discomfort, or fever as directed by your caregiver. Avoid taking medicines that contain aspirin and ibuprofen because they increase the risk of bleeding.  Shower rather than bathe until instructed otherwise by your caregiver.  Change your bandages (dressings) as directed by your caregiver.  You may resume a normal diet and activities as directed by your caregiver.  Avoid lifting weight greater than 20 lb (9 kg) or participating in heavy exercise or contact sports for 10 days or as instructed by your caregiver.  Make an appointment to see your caregiver for stitch (suture) or staple removal. SEEK MEDICAL CARE IF:   You have increased bleeding from your wound.  You have redness, swelling, or increasing pain from your wound or in your neck.  There is pus coming from your wound.  You have an oral temperature above 102 F (38.9 C).  There is a bad smell coming from the wound or dressing.  You develop lightheadedness or feel faint.  You develop numbness, tingling, or muscle spasms in your arms, hands, feet, or face.  You have difficulty swallowing. SEEK IMMEDIATE MEDICAL CARE IF:   You develop a rash.  You have difficulty breathing.  You hear whistling noises that come from your chest.  You develop a cough that becomes increasingly  worse.  You develop any reaction or side effects to medicines given.  There is swelling in your neck.  You develop changes in speech or hoarseness, which is getting worse. MAKE SURE YOU:   Understand these instructions.  Will watch your condition.  Will get help right away if you are not doing well or get worse. Document Released: 05/21/2005 Document Revised: 01/24/2012 Document Reviewed: 01/08/2011 [facility] Patient Information 2015 [place], [place]. This information is not intended to replace advice given to you by your health care provider. Make sure you discuss any questions you have with your health care provider.

## 2015-04-30 NOTE — Discharge Summary (Signed)
Physician Discharge Summary  Patient ID: Julie Ward MRN: [id number] DOB/AGE: 11/15/1996 19 y.o.  Admit date: 04/29/2015 Discharge date: 04/30/2015  Admission Diagnoses:  Discharge Diagnoses:  Active Problems:   Goiter, non-toxic   Discharged Condition: good  Hospital Course: Admitted after left total thyroid lobectomy.  Did wll postoperatively without vocal changes.  Able to swallow liquids well having some discomfort.  Consults: None  Significant Diagnostic Studies: pathology for lobectomy  Treatments: IV hydration, analgesia: acetaminophen and Ultram and surgery: Left thyroid lobectomy  Discharge Exam: Blood pressure 126/78, pulse 79, temperature 98.7 F (37.1 C), temperature source Oral, resp. rate 16, height 5' 7"  (1.702 m), weight 81.194 kg (179 lb), last menstrual period 04/22/2015, SpO2 100 %. General appearance: alert, cooperative, appears stated age and no distress Neck: no adenopathy, no carotid bruit, no JVD, supple, symmetrical, trachea midline and thyroid incision clean and dry.  Removed half of the sutures today.  Willl have the other half removed on Tuesday.  Disposition: 01-Home or Self Care      Discharge Instructions    Call MD for:  difficulty breathing, headache or visual disturbances    Complete by:  As directed      Call MD for:  extreme fatigue    Complete by:  As directed      Call MD for:  hives    Complete by:  As directed      Call MD for:  persistant dizziness or light-headedness    Complete by:  As directed      Call MD for:  persistant nausea and vomiting    Complete by:  As directed      Call MD for:  redness, tenderness, or signs of infection (pain, swelling, redness, odor or green/yellow discharge around incision site)    Complete by:  As directed      Call MD for:  severe uncontrolled pain    Complete by:  As directed      Call MD for:  temperature >100.4    Complete by:  As directed      Change dressing (specify)    Complete by:   As directed   Dressing change: once times per day using dry 4x4 gauze.     Diet general    Complete by:  As directed      Discharge instructions    Complete by:  As directed   See specific post-thyroidectomy instructions     Driving Restrictions    Complete by:  As directed   One week     Increase activity slowly    Complete by:  As directed      Lifting restrictions    Complete by:  As directed   2 weeks     Other Restrictions    Complete by:  As directed   No work for 3 weeks     Sexual Activity Restrictions    Complete by:  As directed   2 weeks            Medication List    TAKE these medications        acetaminophen 500 MG tablet  Commonly known as:  TYLENOL  Take 1,000 mg by mouth 2 (two) times daily.     menthol-cetylpyridinium 3 MG lozenge  Commonly known as:  CEPACOL  Take 1 lozenge (3 mg total) by mouth as needed for sore throat.     traMADol 5 mg/mL Susp  Commonly known as:  ULTRAM  Take 5  mLs (25 mg total) by mouth every 6 (six) hours as needed for moderate pain.       Follow-up Information    Follow up with [facility] [place] On 05/06/2015.   Why:  For suture removal, to see Nicoletta Ba information:   [address] [id number] [phone]      SignedJudeth Horn 04/30/2015, 11:02 AM

## 2015-04-30 NOTE — Progress Notes (Signed)
Discharge home. Home discharge instruction given, no question verbalized. 

## 2015-05-02 ENCOUNTER — Encounter (HOSPITAL_COMMUNITY): Payer: Self-pay | Admitting: *Deleted

## 2015-05-02 ENCOUNTER — Emergency Department (HOSPITAL_COMMUNITY)
Admission: EM | Admit: 2015-05-02 | Discharge: 2015-05-03 | Disposition: A | Discharge status: Home / Self Care | Payer: BLUE CROSS/BLUE SHIELD | Attending: Emergency Medicine | Admitting: Emergency Medicine

## 2015-05-02 ENCOUNTER — Emergency Department (HOSPITAL_COMMUNITY): Payer: BLUE CROSS/BLUE SHIELD

## 2015-05-02 DIAGNOSIS — F419 Anxiety disorder, unspecified: Secondary | ICD-10-CM | POA: Diagnosis present

## 2015-05-02 DIAGNOSIS — Z8742 Personal history of other diseases of the female genital tract: Secondary | ICD-10-CM | POA: Insufficient documentation

## 2015-05-02 DIAGNOSIS — M542 Cervicalgia: Secondary | ICD-10-CM | POA: Diagnosis not present

## 2015-05-02 DIAGNOSIS — Z880 Allergy status to penicillin: Secondary | ICD-10-CM | POA: Diagnosis not present

## 2015-05-02 DIAGNOSIS — Z9889 Other specified postprocedural states: Secondary | ICD-10-CM

## 2015-05-02 DIAGNOSIS — R05 Cough: Secondary | ICD-10-CM | POA: Insufficient documentation

## 2015-05-02 DIAGNOSIS — E890 Postprocedural hypothyroidism: Secondary | ICD-10-CM

## 2015-05-02 DIAGNOSIS — Z8719 Personal history of other diseases of the digestive system: Secondary | ICD-10-CM | POA: Diagnosis not present

## 2015-05-02 DIAGNOSIS — Z79899 Other long term (current) drug therapy: Secondary | ICD-10-CM | POA: Diagnosis not present

## 2015-05-02 DIAGNOSIS — R059 Cough, unspecified: Secondary | ICD-10-CM

## 2015-05-02 MED ORDER — DIAZEPAM 5 MG/ML IJ SOLN: 5.0000 mg | Freq: Once | INTRAMUSCULAR | Status: DC

## 2015-05-02 MED ORDER — SODIUM CHLORIDE 0.9 % IV BOLUS (SEPSIS)
2000.0000 mL | Freq: Once | INTRAVENOUS | Status: AC
Start: 2015-05-02 — End: 2015-05-03
  Administered 2015-05-02: 2000 mL via INTRAVENOUS

## 2015-05-02 NOTE — ED Notes (Signed)
Heat packs provided and placed on pt's shoulders for muscle pain.

## 2015-05-02 NOTE — ED Provider Notes (Signed)
CSN: [id number]     Arrival date & time 05/02/15  2048 History   First MD Initiated Contact with Patient 05/02/15 2202     Chief Complaint  Patient presents with  . Post-op Problem     (Consider location/radiation/quality/duration/timing/severity/associated sxs/prior Treatment) The history is provided by the patient and medical records. No language interpreter was used.     Julie Ward is a 19 y.o. female  with a hx of dysmenorrhea presents to the Emergency Department complaining of mild anterior neck swelling and a "strange sensation" around her surgical site. Patient is 4 days s/p partial thyroidectomy. Patient was discharged home with pain medicine however she has not taken any of it. She reports anterior neck pain with swallowing but is not having difficulty breathing or a choking feeling.  Mother is at bedside and reports they have been consistent with wound care. She reports there is a small red spot over the patient's left clavicle where they have been taping the bandage. She also reports that patient has not been drinking much fluid over the last several days because of the pain. Patient reports the site hurts. She denies fevers or chills, palpitations. She reports that she has not been taking her pain pills because they make her anxious. She reports that she has been coughing a lot. She reports that her surgeon told her this was normal but she is worried about it. She reports she has been using her incentive spirometer as directed.  Past Medical History  Diagnosis Date  . Allergy   . Dysmenorrhea   . Abdominal pain, recurrent   . Appendicitis   . Crohn's disease of colon 10/14    sees Dr. Blanca Friend at [place]   . Shortness of breath dyspnea     ith thyroid problem  . Anxiety   . GERD (gastroesophageal reflux disease)     not taking any medication   Past Surgical History  Procedure Laterality Date  . Appendectomy  07-14-11    laparoscopic   .  Esophagogastroduodenoscopy  01/28/2012    Procedure: ESOPHAGOGASTRODUODENOSCOPY (EGD);  Surgeon: Oletha Blend, MD;  Location: [facility] [place];  Service: Gastroenterology;  Laterality: N/A;  . Wisdom tooth extraction    . Cholecystectomy  02/11/2012    Procedure: LAPAROSCOPIC CHOLECYSTECTOMY WITH INTRAOPERATIVE CHOLANGIOGRAM;  Surgeon: Gwenyth Ober, MD;  Location: [place];  Service: General;  Laterality: N/A;  . Thyroid lobectomy Left 04/29/2015    Procedure: LEFT TOTAL THYROID LOBECTOMY;  Surgeon: Judeth Horn, MD;  Location: [facility];  Service: General;  Laterality: Left;   Family History  Problem Relation Age of Onset  . Cancer Paternal Aunt     ovarian, kidney  . Cancer Paternal Uncle     colon  . Colon cancer Paternal Uncle   . Cancer Maternal Grandmother     breast   . Hypertension Maternal Grandmother   . Irritable bowel syndrome Maternal Grandmother   . Heart disease Maternal Grandmother   . Hypertension Paternal Grandmother   . Hyperlipidemia Paternal Grandmother    History  Substance Use Topics  . Smoking status: Never Smoker   . Smokeless tobacco: Never Used  . Alcohol Use: No     Comment: none   OB History    Gravida Para Term Preterm AB TAB SAB Ectopic Multiple Living   0              Review of Systems  Constitutional: Negative for fever, diaphoresis, appetite change, fatigue and  unexpected weight change.  HENT: Negative for mouth sores.        Painful swallowing  Eyes: Negative for visual disturbance.  Respiratory: Negative for cough, chest tightness, shortness of breath and wheezing.   Cardiovascular: Negative for chest pain.  Gastrointestinal: Negative for nausea, vomiting, abdominal pain, diarrhea and constipation.  Endocrine: Negative for polydipsia, polyphagia and polyuria.  Genitourinary: Negative for dysuria, urgency, frequency and hematuria.  Musculoskeletal: Negative for back pain and neck stiffness.  Skin: Negative for rash.       Incision to the central base of  the neck  Allergic/Immunologic: Negative for immunocompromised state.  Neurological: Negative for syncope, light-headedness and headaches.  Hematological: Does not bruise/bleed easily.  Psychiatric/Behavioral: Negative for sleep disturbance. The patient is nervous/anxious.       Allergies  Fentanyl; Morphine and related; Oxycontin; Penicillins; Prednisone; and Valium  Home Medications   Prior to Admission medications   Medication Sig Start Date End Date Taking? Authorizing Provider  acetaminophen (TYLENOL) 500 MG tablet Take 1,000 mg by mouth 2 (two) times daily.   Yes Historical Provider, MD  traMADol (ULTRAM) 50 MG tablet Take 50 mg by mouth every 6 (six) hours as needed for moderate pain.   Yes Historical Provider, MD  menthol-cetylpyridinium (CEPACOL) 3 MG lozenge Take 1 lozenge (3 mg total) by mouth as needed for sore throat. Patient not taking: Reported on 05/02/2015 04/30/15   Judeth Horn, MD  traMADol (ULTRAM) 5 mg/mL SUSP Take 5 mLs (25 mg total) by mouth every 6 (six) hours as needed for moderate pain. Patient not taking: Reported on 05/02/2015 04/30/15   Judeth Horn, MD   BP 130/74 mmHg  Pulse 68  Temp(Src) 98.3 F (36.8 C) (Oral)  Resp 20  Ht 5' 7"  (1.702 m)  Wt 179 lb (81.194 kg)  BMI 28.03 kg/m2  SpO2 100%  LMP 04/22/2015 Physical Exam  Constitutional: She appears well-developed and well-nourished. No distress.  Awake, alert, nontoxic appearance  HENT:  Head: Normocephalic and atraumatic.  Mouth/Throat: Oropharynx is clear and moist. No oropharyngeal exudate.  Eyes: Conjunctivae are normal. No scleral icterus.  Neck: Normal range of motion. Neck supple.  Midline surgical incision at the base of the neck, sutures in place with granulation tissue, no erythema, edema or purulent drainage of the surgical incision site. Mild swelling of the anterior neck around the location of her thyroid with tenderness to palpation Patient reports paresthesias around the surgical  incision site but has objective sensation to normal touch in these locations Normal phonation No stridor Handling secretions without difficulty Slightly decreased range of motion due to anterior neck pain  Cardiovascular: Normal rate, regular rhythm, normal heart sounds and intact distal pulses.   No murmur heard. Pulmonary/Chest: Effort normal and breath sounds normal. No respiratory distress. She has no wheezes.  Equal chest expansion Clear and equal breath sounds  Abdominal: Soft. Bowel sounds are normal. She exhibits no mass. There is no tenderness. There is no rebound and no guarding.  Soft and nontender  Musculoskeletal: Normal range of motion. She exhibits no edema.  Neurological: She is alert.  Speech is clear and goal oriented Moves extremities without ataxia  Skin: Skin is warm and dry. She is not diaphoretic.  Psychiatric: Her mood appears anxious.  Patient is anxious and tearful.   Nursing note and vitals reviewed.   ED Course  Procedures (including critical care time) Labs Review Labs Reviewed - No data to display  Imaging Review Dg Chest 2 View  05/02/2015  CLINICAL DATA:  19 year old female recently status post thyroid lobectomy with cough and swelling at the incision. Difficulty swallowing. Initial encounter.  EXAM: CHEST  2 VIEW  COMPARISON:  Neck CT 04/22/2015.  Chest radiographs 04/29/2015.  FINDINGS: Numerous surgical clips at the left thoracic inlet compatible with left hemithyroidectomy. Less rightward shift of the trachea. Small focus of gas at the left thoracic inlet could be postoperative or within the hypopharynx.  No pneumothorax or pneumomediastinum. Trace pleural effusions. Otherwise the lungs remain clear. No acute osseous abnormality identified. Stable cholecystectomy clips.  IMPRESSION: 1. Sequelae of left hemithyroidectomy with resolved rightward mass effect on the trachea. 2. Trace pleural effusions, otherwise no acute cardiopulmonary abnormality.    Electronically Signed   By: Genevie Ann M.D.   On: 05/02/2015 22:40     EKG Interpretation None      MDM   Final diagnoses:  Cough  S/P partial thyroidectomy  Anxiety   Julie Ward presents with anterior neck pain at her surgical incision site.  She arrives anxious and tearful.  She reports that her shoulders hurt from holding them up and that she feels anxious about the new tingling sensation around the surgical site.  She adamantly refuses pain control and anxiety medication stating that it will make her feel worse.  We'll give fluids and heat packs and reassess.  12:50 AM Patient is resting comfortably. She reports that the heat packs made her shoulders and neck feel much better. She is without signs of systemic infection. Her wound is clean without evidence of secondary infection. She has been drinking ginger ale here in the emergency department without difficulty. No choking episodes. No vomiting. Patient is well-appearing and I highly believe that symptoms are normal for 4 days postop. I believe the patient is anxious about these. She has a follow-up with her surgeon in 3 days. Recommend that she keep this appointment and return to the emergency department for worsening symptoms.  BP 130/74 mmHg  Pulse 68  Temp(Src) 98.3 F (36.8 C) (Oral)  Resp 20  Ht 5' 7"  (1.702 m)  Wt 179 lb (81.194 kg)  BMI 28.03 kg/m2  SpO2 100%  LMP 04/22/2015      Jarrett Soho Jaesean Litzau, PA-C 05/03/15 4742  Pamella Pert, MD 05/03/15 1050

## 2015-05-02 NOTE — ED Notes (Signed)
Pt states that she had thyroid surgery on Tues; pt states that she feels like her throat is swollen and feels like she is having trouble swallowing; pt has had minimal intake of food and fluid; pt also reports coughing up phlegm that is yellow in color; mom states that the left of the incision area appears swollen

## 2015-05-03 NOTE — Discharge Instructions (Signed)
1. Medications: usual home medications including the pain medication prescribed to your 2. Treatment: rest, drink plenty of fluids, continue wound care 3. Follow Up: Please followup with your surgeon at your scheduled appointment on Tuesday;Please return to the ER for worsening symptoms

## 2015-06-16 ENCOUNTER — Ambulatory Visit: Payer: BLUE CROSS/BLUE SHIELD | Admitting: Internal Medicine

## 2015-06-16 DIAGNOSIS — Z0289 Encounter for other administrative examinations: Secondary | ICD-10-CM

## 2015-08-18 ENCOUNTER — Ambulatory Visit: Payer: BLUE CROSS/BLUE SHIELD | Admitting: Internal Medicine

## 2015-08-18 DIAGNOSIS — Z0289 Encounter for other administrative examinations: Secondary | ICD-10-CM

## 2015-09-23 ENCOUNTER — Encounter (HOSPITAL_COMMUNITY): Payer: Self-pay | Admitting: Emergency Medicine

## 2015-09-23 ENCOUNTER — Emergency Department (HOSPITAL_COMMUNITY): Payer: 59

## 2015-09-23 ENCOUNTER — Emergency Department (HOSPITAL_COMMUNITY)
Admission: EM | Admit: 2015-09-23 | Discharge: 2015-09-23 | Disposition: A | Discharge status: Home / Self Care | Payer: 59 | Attending: Emergency Medicine | Admitting: Emergency Medicine

## 2015-09-23 DIAGNOSIS — R112 Nausea with vomiting, unspecified: Secondary | ICD-10-CM | POA: Diagnosis not present

## 2015-09-23 DIAGNOSIS — Z8719 Personal history of other diseases of the digestive system: Secondary | ICD-10-CM | POA: Insufficient documentation

## 2015-09-23 DIAGNOSIS — Z880 Allergy status to penicillin: Secondary | ICD-10-CM | POA: Diagnosis not present

## 2015-09-23 DIAGNOSIS — F419 Anxiety disorder, unspecified: Secondary | ICD-10-CM | POA: Diagnosis not present

## 2015-09-23 DIAGNOSIS — Z8742 Personal history of other diseases of the female genital tract: Secondary | ICD-10-CM | POA: Insufficient documentation

## 2015-09-23 DIAGNOSIS — R519 Headache, unspecified: Secondary | ICD-10-CM

## 2015-09-23 DIAGNOSIS — R51 Headache: Secondary | ICD-10-CM | POA: Insufficient documentation

## 2015-09-23 DIAGNOSIS — Z79899 Other long term (current) drug therapy: Secondary | ICD-10-CM | POA: Diagnosis not present

## 2015-09-23 MED ORDER — METOCLOPRAMIDE HCL 5 MG/ML IJ SOLN
5.0000 mg | Freq: Once | INTRAMUSCULAR | Status: AC
  Administered 2015-09-23: 5 mg via INTRAVENOUS
  Filled 2015-09-23: qty 2

## 2015-09-23 MED ORDER — KETOROLAC TROMETHAMINE 30 MG/ML IJ SOLN
30.0000 mg | Freq: Once | INTRAMUSCULAR | Status: AC
  Administered 2015-09-23: 30 mg via INTRAVENOUS
  Filled 2015-09-23: qty 1

## 2015-09-23 MED ORDER — DIPHENHYDRAMINE HCL 50 MG/ML IJ SOLN
25.0000 mg | Freq: Once | INTRAMUSCULAR | Status: AC
  Administered 2015-09-23: 25 mg via INTRAVENOUS
  Filled 2015-09-23: qty 1

## 2015-09-23 MED ORDER — METOCLOPRAMIDE HCL 10 MG PO TABS: 10.0000 mg | ORAL_TABLET | Freq: Once | ORAL | Status: DC

## 2015-09-23 MED ORDER — DIPHENHYDRAMINE HCL 25 MG PO CAPS: 25.0000 mg | ORAL_CAPSULE | Freq: Once | ORAL | Status: DC

## 2015-09-23 NOTE — Discharge Instructions (Signed)
General Headache Without Cause A headache is pain or discomfort felt around the head or neck area. There are many causes and types of headaches. In some cases, the cause may not be found.  HOME CARE  Managing Pain  Take over-the-counter and prescription medicines only as told by your doctor.  Lie down in a dark, quiet room when you have a headache.  If directed, apply ice to the head and neck area:  Put ice in a plastic bag.  Place a towel between your skin and the bag.  Leave the ice on for 20 minutes, 2-3 times per day.  Use a heating pad or hot shower to apply heat to the head and neck area as told by your doctor.  Keep lights dim if bright lights bother you or make your headaches worse. Eating and Drinking  Eat meals on a regular schedule.  Lessen how much alcohol you drink.  Lessen how much caffeine you drink, or stop drinking caffeine. General Instructions  Keep all follow-up visits as told by your doctor. This is important.  Keep a journal to find out if certain things bring on headaches. For example, write down:  What you eat and drink.  How much sleep you get.  Any change to your diet or medicines.  Relax by getting a massage or doing other relaxing activities.  Lessen stress.  Sit up straight. Do not tighten (tense) your muscles.  Do not use tobacco products. This includes cigarettes, chewing tobacco, or e-cigarettes. If you need help quitting, ask your doctor.  Exercise regularly as told by your doctor.  Get enough sleep. This often means 7-9 hours of sleep. GET HELP IF:  Your symptoms are not helped by medicine.  You have a headache that feels different than the other headaches.  You feel sick to your stomach (nauseous) or you throw up (vomit).  You have a fever. GET HELP RIGHT AWAY IF:   Your headache becomes really bad.  You keep throwing up.  You have a stiff neck.  You have trouble seeing.  You have trouble speaking.  You have  pain in the eye or ear.  Your muscles are weak or you lose muscle control.  You lose your balance or have trouble walking.  You feel like you will pass out (faint) or you pass out.  You have confusion.   This information is not intended to replace advice given to you by your health care provider. Make sure you discuss any questions you have with your health care provider.   Document Released: 08/10/2008 Document Revised: 07/23/2015 Document Reviewed: 02/24/2015 Elsevier Interactive Patient Education [employer].  Please read attached information. If you experience any new or worsening signs or symptoms please return to the emergency room for evaluation. Please follow-up with your primary care provider or specialist as discussed. Please use medication prescribed only as directed and discontinue taking if you have any concerning signs or symptoms.

## 2015-09-23 NOTE — ED Notes (Signed)
Patient c/o headache x3 days, and now has generalized pain. Patient states she vomited this am as well. Patient took tylenol at home, last dose at 2200 on  09/22/2015.

## 2015-09-23 NOTE — ED Provider Notes (Signed)
CSN: [id number]     Arrival date & time 09/23/15  1025 History   First MD Initiated Contact with Patient 09/23/15 1049     Chief Complaint  Patient presents with  . Headache    x3 days   HPI    19 year old female presents today with headache. Patient reports that on Sunday she started developing headache around the left forehead, this slowly progressed through the evening and throughout her entire forehead. She reports that she went to bed which relieved her symptoms. She woke the next morning with return of the headache that persisted throughout the day, this was relieved with Tylenol. Patient reports light sensitivity, nausea with 1 episode of vomiting today. She denies previous history of the same, denies head trauma, fever, weight loss neck stiffness, changes in vision smell or taste, any focal neurological deficits.    Past Medical History  Diagnosis Date  . Allergy   . Dysmenorrhea   . Abdominal pain, recurrent   . Appendicitis   . Crohn's disease of colon ([place]) 10/14    sees Dr. Blanca Friend at [facility]   . Shortness of breath dyspnea     ith thyroid problem  . Anxiety   . GERD (gastroesophageal reflux disease)     not taking any medication   Past Surgical History  Procedure Laterality Date  . Appendectomy  07-14-11    laparoscopic   . Esophagogastroduodenoscopy  01/28/2012    Procedure: ESOPHAGOGASTRODUODENOSCOPY (EGD);  Surgeon: Oletha Blend, MD;  Location: [facility];  Service: Gastroenterology;  Laterality: N/A;  . Wisdom tooth extraction    . Cholecystectomy  02/11/2012    Procedure: LAPAROSCOPIC CHOLECYSTECTOMY WITH INTRAOPERATIVE CHOLANGIOGRAM;  Surgeon: Gwenyth Ober, MD;  Location: [facility];  Service: General;  Laterality: N/A;  . Thyroid lobectomy Left 04/29/2015    Procedure: LEFT TOTAL THYROID LOBECTOMY;  Surgeon: Judeth Horn, MD;  Location: [facility] OR;  Service: General;  Laterality: Left;   Family History  Problem Relation Age of Onset  . Cancer Paternal Aunt    ovarian, kidney  . Cancer Paternal Uncle     colon  . Colon cancer Paternal Uncle   . Cancer Maternal Grandmother     breast   . Hypertension Maternal Grandmother   . Irritable bowel syndrome Maternal Grandmother   . Heart disease Maternal Grandmother   . Hypertension Paternal Grandmother   . Hyperlipidemia Paternal Grandmother    Social History  Substance Use Topics  . Smoking status: Never Smoker   . Smokeless tobacco: Never Used  . Alcohol Use: 0.0 oz/week    0 Standard drinks or equivalent per week     Comment: occ   OB History    Gravida Para Term Preterm AB TAB SAB Ectopic Multiple Living   0              Review of Systems  All other systems reviewed and are negative.   Allergies  Fentanyl; Morphine and related; Oxycontin; Penicillins; Prednisone; and Valium  Home Medications   Prior to Admission medications   Medication Sig Start Date End Date Taking? Authorizing Provider  acetaminophen (TYLENOL) 500 MG tablet Take 1,000 mg by mouth 2 (two) times daily.   Yes Historical Provider, MD  menthol-cetylpyridinium (CEPACOL) 3 MG lozenge Take 1 lozenge (3 mg total) by mouth as needed for sore throat. Patient not taking: Reported on 05/02/2015 04/30/15   Judeth Horn, MD  traMADol (ULTRAM) 5 mg/mL SUSP Take 5 mLs (25 mg total) by  mouth every 6 (six) hours as needed for moderate pain. Patient not taking: Reported on 05/02/2015 04/30/15   Judeth Horn, MD   BP 114/88 mmHg  Pulse 74  Temp(Src) 98.4 F (36.9 C) (Oral)  Resp 18  Ht 5' 7"  (1.702 m)  Wt 185 lb (83.915 kg)  BMI 28.97 kg/m2  SpO2 100%  LMP 09/16/2015 (Approximate)   Physical Exam  Constitutional: She is oriented to person, place, and time. She appears well-developed and well-nourished. No distress.  HENT:  Head: Normocephalic and atraumatic.  Eyes: Conjunctivae and EOM are normal. Pupils are equal, round, and reactive to light. Right eye exhibits no discharge. Left eye exhibits no discharge. No scleral  icterus.  Neck: Normal range of motion. Neck supple. No JVD present. No tracheal deviation present.  Pulmonary/Chest: Effort normal. No stridor.  Musculoskeletal: Normal range of motion. She exhibits no edema or tenderness.  Lymphadenopathy:    She has no cervical adenopathy.  Neurological: She is alert and oriented to person, place, and time. She has normal strength. She displays no atrophy and no tremor. No cranial nerve deficit or sensory deficit. She exhibits normal muscle tone. She displays a negative Romberg sign. She displays no seizure activity. Coordination and gait normal. GCS eye subscore is 4. GCS verbal subscore is 5. GCS motor subscore is 6.  Reflex Scores:      Patellar reflexes are 2+ on the right side and 2+ on the left side. Skin: She is not diaphoretic.  Psychiatric: She has a normal mood and affect. Her behavior is normal. Judgment and thought content normal.  Nursing note and vitals reviewed.   ED Course  Procedures (including critical care time) Labs Review Labs Reviewed - No data to display  Imaging Review No results found. I have personally reviewed and evaluated these images and lab results as part of my medical decision-making.   EKG Interpretation None      MDM   Final diagnoses:  Acute nonintractable headache, unspecified headache type    Labs:  Imaging: CT head without contrast shows no acute intracranial abnormalities  Consults:  Therapeutics: Reglan, Benadryl, Toradol  Discharge Meds:   Assessment/Plan: Patient presents with headache, no significant past medical history of the same. Slow onset low suspicion for subarachnoid hemorrhage, no fever altered mental status and neck stiffness that would indicate meningitis. Patient has no focal neurological deficits, headache significantly improved here in the ED with above medications. CT shows no abnormalities. Patient will be discharged home with instructions to follow-up with primary care for  reevaluation and further management. She is given strict return precautions, verbalized understanding and agreement for today's plan with no further questions or concerns at the time of discharge        Okey Regal, PA-C 09/25/15 [place], DO 09/26/15 1519

## 2015-12-02 ENCOUNTER — Ambulatory Visit (INDEPENDENT_AMBULATORY_CARE_PROVIDER_SITE_OTHER): Payer: 59 | Admitting: Family Medicine

## 2015-12-02 ENCOUNTER — Encounter: Payer: Self-pay | Admitting: Family Medicine

## 2015-12-02 VITALS — BP 132/64 | Temp 97.7°F | Ht 67.0 in | Wt 196.8 lb

## 2015-12-02 DIAGNOSIS — R1030 Lower abdominal pain, unspecified: Secondary | ICD-10-CM | POA: Diagnosis not present

## 2015-12-02 DIAGNOSIS — J069 Acute upper respiratory infection, unspecified: Secondary | ICD-10-CM

## 2015-12-02 LAB — POCT URINALYSIS DIPSTICK
BILIRUBIN UA: NEGATIVE
GLUCOSE UA: NEGATIVE
Ketones, UA: NEGATIVE
Leukocytes, UA: NEGATIVE
NITRITE UA: NEGATIVE
Protein, UA: NEGATIVE
RBC UA: NEGATIVE
Spec Grav, UA: >=1.03
UROBILINOGEN UA: 1
pH, UA: 6

## 2015-12-02 LAB — POCT URINE PREGNANCY: PREG TEST UR: NEGATIVE

## 2015-12-02 NOTE — Progress Notes (Signed)
   Subjective:    Patient ID: Julie Ward, female    DOB: [birth year], 20 y.o.   MRN: [id number]  HPI Here for 3 days of fatigue and feeling chilled. No fever or sweats. She was hoarse the first 2 days but this resolved. No NVD. No cough ot ST. She has had some lower abdominal cramps today as if she were starting her menses, but there has been no vaginal bleeding as yet.    Review of Systems  Constitutional: Positive for fatigue. Negative for fever, chills and diaphoresis.  HENT: Positive for voice change. Negative for congestion, ear pain, postnasal drip and sore throat.   Eyes: Negative.   Respiratory: Negative.   Gastrointestinal: Positive for abdominal pain. Negative for nausea, vomiting, diarrhea, constipation, blood in stool, abdominal distention, anal bleeding and rectal pain.  Genitourinary: Negative.        Objective:   Physical Exam  Constitutional: She appears well-developed and well-nourished. No distress.  HENT:  Right Ear: External ear normal.  Nose: Nose normal.  Mouth/Throat: Oropharynx is clear and moist.  Eyes: Conjunctivae are normal.  Neck: No thyromegaly present.  Cardiovascular: Normal rate, regular rhythm, normal heart sounds and intact distal pulses.   Pulmonary/Chest: Effort normal and breath sounds normal.  Abdominal: Soft. Bowel sounds are normal. She exhibits no distension and no mass. There is no tenderness. There is no rebound and no guarding.  Lymphadenopathy:    She has no cervical adenopathy.          Assessment & Plan:  She has a viral URI and it seems to be on the wane. Rest, drink fluids. Use Tylenol for cramps prn. Written out of work today and tomorrow.

## 2015-12-16 ENCOUNTER — Telehealth: Payer: Self-pay | Admitting: Family Medicine

## 2015-12-16 NOTE — Telephone Encounter (Signed)
Patient came in today needing a work accommodation for [employer] Disease.

## 2015-12-16 NOTE — Telephone Encounter (Signed)
I spoke with pt and she is requesting to take a bathroom break 2 x hour if needed and also be allowed to have snacks at desk.

## 2015-12-17 NOTE — Telephone Encounter (Signed)
Note is ready and I spoke with pt.

## 2015-12-17 NOTE — Telephone Encounter (Signed)
Such a note was written

## 2016-01-24 ENCOUNTER — Encounter (HOSPITAL_COMMUNITY): Payer: Self-pay | Admitting: Emergency Medicine

## 2016-01-24 ENCOUNTER — Emergency Department (INDEPENDENT_AMBULATORY_CARE_PROVIDER_SITE_OTHER)
Admission: EM | Admit: 2016-01-24 | Discharge: 2016-01-24 | Disposition: A | Discharge status: Home / Self Care | Payer: 59 | Source: Home / Self Care | Attending: Emergency Medicine | Admitting: Emergency Medicine

## 2016-01-24 DIAGNOSIS — S96911A Strain of unspecified muscle and tendon at ankle and foot level, right foot, initial encounter: Secondary | ICD-10-CM

## 2016-01-24 DIAGNOSIS — M7751 Other enthesopathy of right foot: Secondary | ICD-10-CM

## 2016-01-24 DIAGNOSIS — M7671 Peroneal tendinitis, right leg: Secondary | ICD-10-CM

## 2016-01-24 NOTE — ED Provider Notes (Signed)
CSN: [id number]     Arrival date & time 01/24/16  31 History   First MD Initiated Contact with Patient 01/24/16 1514     Chief Complaint  Patient presents with  . Ankle Pain   (Consider location/radiation/quality/duration/timing/severity/associated sxs/prior Treatment) HPI Comments: 20 year old female complaining of right ankle pain since yesterday. She woke yesterday morning with pain to the lateral aspect of the right ankle. There is also pain to the base of the right great toe. Denies any known injury. No fall, no twisting of the ankle she is aware of, no apparent force trauma. 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 She has tried heat and ice and OTC pain medicines.   Past Medical History  Diagnosis Date  . Allergy   . Dysmenorrhea   . Abdominal pain, recurrent   . Appendicitis   . Crohn's disease of colon ([place]) 10/14    sees Dr. Blanca Friend at [place]   . Shortness of breath dyspnea     ith thyroid problem  . Anxiety   . GERD (gastroesophageal reflux disease)     not taking any medication   Past Surgical History  Procedure Laterality Date  . Appendectomy  07-14-11    laparoscopic   . Esophagogastroduodenoscopy  01/28/2012    Procedure: ESOPHAGOGASTRODUODENOSCOPY (EGD);  Surgeon: Oletha Blend, MD;  Location: [place];  Service: Gastroenterology;  Laterality: N/A;  . Wisdom tooth extraction    . Cholecystectomy  02/11/2012    Procedure: LAPAROSCOPIC CHOLECYSTECTOMY WITH INTRAOPERATIVE CHOLANGIOGRAM;  Surgeon: Gwenyth Ober, MD;  Location: [place];  Service: General;  Laterality: N/A;  . Thyroid lobectomy Left 04/29/2015    Procedure: LEFT TOTAL THYROID LOBECTOMY;  Surgeon: Judeth Horn, MD;  Location: [facility] OR;  Service: General;  Laterality: Left;   Family History  Problem Relation Age of Onset  . Cancer Paternal Aunt     ovarian, kidney  . Cancer Paternal Uncle     colon  . Colon cancer Paternal Uncle   . Cancer  Maternal Grandmother     breast   . Hypertension Maternal Grandmother   . Irritable bowel syndrome Maternal Grandmother   . Heart disease Maternal Grandmother   . Hypertension Paternal Grandmother   . Hyperlipidemia Paternal Grandmother    Social History  Substance Use Topics  . Smoking status: Never Smoker   . Smokeless tobacco: Never Used  . Alcohol Use: 0.0 oz/week    0 Standard drinks or equivalent per week     Comment: occ   OB History    Gravida Para Term Preterm AB TAB SAB Ectopic Multiple Living   0              Review of Systems  Constitutional: Negative for fever, chills and activity change.  HENT: Negative.   Respiratory: Negative.   Cardiovascular: Negative for chest pain.  Musculoskeletal: Positive for gait problem. Negative for back pain, neck pain and neck stiffness.       As per HPI  Skin: Negative for color change, pallor and rash.  Neurological: Negative.     Allergies  Fentanyl; Morphine and related; Oxycontin; Penicillins; Prednisone; and Valium  Home Medications   Prior to Admission medications   Medication Sig Start Date End Date Taking? Authorizing Provider  acetaminophen (TYLENOL) 500 MG tablet Take 1,000 mg by mouth 2 (two) times daily. Reported on 12/02/2015    Historical Provider, MD  menthol-cetylpyridinium (CEPACOL) 3 MG lozenge Take 1 lozenge (3 mg total) by mouth as needed for sore throat. Patient not taking: Reported on 12/02/2015 04/30/15   Judeth Horn, MD  traMADol (ULTRAM) 5 mg/mL SUSP Take 5 mLs (25 mg total) by mouth every 6 (six) hours as needed for moderate pain. Patient not taking: Reported on 12/02/2015 04/30/15   Judeth Horn, MD   Meds Ordered and Administered this Visit  Medications - No data to display  BP 84/42 mmHg  Pulse 86  Temp(Src) 97.6 F (36.4 C) (Oral)  SpO2 99%  LMP 01/04/2016 No data found.   Physical Exam  Constitutional: She is oriented to person, place, and time. She appears well-developed and  well-nourished. No distress.  HENT:  Head: Normocephalic and atraumatic.  Eyes: EOM are normal. Pupils are equal, round, and reactive to light.  Neck: Normal range of motion. Neck supple.  Cardiovascular: Normal rate.   Pulmonary/Chest: Effort normal. No respiratory distress.  Musculoskeletal: Normal range of motion. She exhibits tenderness. She exhibits no edema.  Right ankle with minimal puffiness just distal to the lateral malleolus. There is tenderness directly over the peroneus brevis and longus. Lesser tenderness over the lateral malleolus. No deformity. Dorsiflexion and plantarflexion is intact. No tenderness over the metatarsals. There is tenderness and mild pain over the base of the great toe/head of the first metatarsal. No deformity or swelling or discoloration.  Neurological: She is alert and oriented to person, place, and time. No cranial nerve deficit. She exhibits normal muscle tone.  Skin: Skin is warm and dry.  Psychiatric: She has a normal mood and affect.  Nursing note and vitals reviewed.   ED Course  Procedures (including critical care time)  Labs Review Labs Reviewed - No data to display  Imaging Review No results found.   Visual Acuity Review  Right Eye Distance:   Left Eye Distance:   Bilateral Distance:    Right Eye Near:   Left Eye Near:    Bilateral Near:         MDM   1. Ankle strain, right, initial encounter   2. Peroneus brevis tendinitis, right    Your injury is more likely a strain rather than a sprain but the treatment is similar. Wear comfortable low heel shoes Ice Ankle splint for a few days for support and comfort. ASO splint RICE    Janne Napoleon, NP 01/24/16 1550

## 2016-01-24 NOTE — Discharge Instructions (Signed)
Ankle Sprain Your injury is more likely a strain rather than a sprain but the treatment is similar. Wear comfortable low heel shoes Ice Ankle splint for a few days for support and comfort. An ankle sprain is an injury to the strong, fibrous tissues (ligaments) that hold your ankle bones together.  HOME CARE   Put ice on your ankle for 1-2 days or as told by your doctor.  Put ice in a plastic bag.  Place a towel between your skin and the bag.  Leave the ice on for 15-20 minutes at a time, every 2 hours while you are awake.  Only take medicine as told by your doctor.  Raise (elevate) your injured ankle above the level of your heart as much as possible for 2-3 days.  Use crutches if your doctor tells you to. Slowly put your own weight on the affected ankle. Use the crutches until you can walk without pain.  If you have a plaster splint:  Do not rest it on anything harder than a pillow for 24 hours.  Do not put weight on it.  Do not get it wet.  Take it off to shower or bathe.  If given, use an elastic wrap or support stocking for support. Take the wrap off if your toes lose feeling (numb), tingle, or turn cold or blue.  If you have an air splint:  Add or let out air to make it comfortable.  Take it off at night and to shower and bathe.  Wiggle your toes and move your ankle up and down often while you are wearing it. GET HELP IF:  You have rapidly increasing bruising or puffiness (swelling).  Your toes feel very cold.  You lose feeling in your foot.  Your medicine does not help your pain. GET HELP RIGHT AWAY IF:   Your toes lose feeling (numb) or turn blue.  You have severe pain that is increasing. MAKE SURE YOU:   Understand these instructions.  Will watch your condition.  Will get help right away if you are not doing well or get worse.   This information is not intended to replace advice given to you by your health care provider. Make sure you discuss any  questions you have with your health care provider.   Document Released: 04/19/2008 Document Revised: 11/22/2014 Document Reviewed: 05/15/2012 Elsevier Interactive Patient Education [employer].

## 2016-01-24 NOTE — ED Notes (Signed)
C/o right ankle pain associated w/swelling onset yest am when she woke up... Reports the night before she was fine Denies inj/trauma... Pain increases w/activity and when she bears wt

## 2016-01-28 ENCOUNTER — Emergency Department (HOSPITAL_COMMUNITY)
Admission: EM | Admit: 2016-01-28 | Discharge: 2016-01-28 | Disposition: A | Discharge status: Home / Self Care | Payer: 59 | Attending: Emergency Medicine | Admitting: Emergency Medicine

## 2016-01-28 ENCOUNTER — Encounter (HOSPITAL_COMMUNITY): Payer: Self-pay | Admitting: Emergency Medicine

## 2016-01-28 DIAGNOSIS — Z880 Allergy status to penicillin: Secondary | ICD-10-CM | POA: Diagnosis not present

## 2016-01-28 DIAGNOSIS — B349 Viral infection, unspecified: Secondary | ICD-10-CM | POA: Diagnosis not present

## 2016-01-28 DIAGNOSIS — Z8719 Personal history of other diseases of the digestive system: Secondary | ICD-10-CM | POA: Diagnosis not present

## 2016-01-28 DIAGNOSIS — Z8659 Personal history of other mental and behavioral disorders: Secondary | ICD-10-CM | POA: Diagnosis not present

## 2016-01-28 DIAGNOSIS — Z8742 Personal history of other diseases of the female genital tract: Secondary | ICD-10-CM | POA: Insufficient documentation

## 2016-01-28 DIAGNOSIS — J029 Acute pharyngitis, unspecified: Secondary | ICD-10-CM | POA: Diagnosis present

## 2016-01-28 LAB — RAPID STREP SCREEN (MED CTR MEBANE ONLY): Streptococcus, Group A Screen (Direct): NEGATIVE

## 2016-01-28 MED ORDER — ACETAMINOPHEN 325 MG PO TABS
650.0000 mg | ORAL_TABLET | Freq: Once | ORAL | Status: AC
  Administered 2016-01-28: 650 mg via ORAL

## 2016-01-28 MED ORDER — ACETAMINOPHEN 325 MG PO TABS
ORAL_TABLET | ORAL | Status: AC
  Filled 2016-01-28: qty 2

## 2016-01-28 NOTE — ED Notes (Signed)
Discharge instructions and follow up care reviewed with patient. Patient verbalized understanding. 

## 2016-01-28 NOTE — ED Notes (Signed)
Bed: DK20 Expected date:  Expected time:  Means of arrival:  Comments:

## 2016-01-28 NOTE — Discharge Instructions (Signed)
Viral Infections A viral infection can be caused by different types of viruses.Most viral infections are not serious and resolve on their own. However, some infections may cause severe symptoms and may lead to further complications. SYMPTOMS Viruses can frequently cause:  Minor sore throat.  Aches and pains.  Headaches.  Runny nose.  Different types of rashes.  Watery eyes.  Tiredness.  Cough.  Loss of appetite.  Gastrointestinal infections, resulting in nausea, vomiting, and diarrhea. These symptoms do not respond to antibiotics because the infection is not caused by bacteria. However, you might catch a bacterial infection following the viral infection. This is sometimes called a "superinfection." Symptoms of such a bacterial infection may include:  Worsening sore throat with pus and difficulty swallowing.  Swollen neck glands.  Chills and a high or persistent fever.  Severe headache.  Tenderness over the sinuses.  Persistent overall ill feeling (malaise), muscle aches, and tiredness (fatigue).  Persistent cough.  Yellow, green, or brown mucus production with coughing. HOME CARE INSTRUCTIONS   Only take over-the-counter or prescription medicines for pain, discomfort, diarrhea, or fever as directed by your caregiver.  Drink enough water and fluids to keep your urine clear or pale yellow. Sports drinks can provide valuable electrolytes, sugars, and hydration.  Get plenty of rest and maintain proper nutrition. Soups and broths with crackers or rice are fine. SEEK IMMEDIATE MEDICAL CARE IF:   You have severe headaches, shortness of breath, chest pain, neck pain, or an unusual rash.  You have uncontrolled vomiting, diarrhea, or you are unable to keep down fluids.  You or your child has an oral temperature above 102 F (38.9 C), not controlled by medicine.  Your baby is older than 3 months with a rectal temperature of 102 F (38.9 C) or higher.  Your baby is 74  months old or younger with a rectal temperature of 100.4 F (38 C) or higher. MAKE SURE YOU:   Understand these instructions.  Will watch your condition.  Will get help right away if you are not doing well or get worse.   This information is not intended to replace advice given to you by your health care provider. Make sure you discuss any questions you have with your health care provider.   Document Released: 08/11/2005 Document Revised: 01/24/2012 Document Reviewed: 04/09/2015 Elsevier Interactive Patient Education [employer].

## 2016-01-28 NOTE — ED Provider Notes (Signed)
CSN: [id number]     Arrival date & time 01/28/16  0240 History   First MD Initiated Contact with Patient 01/28/16 0518     Chief Complaint  Patient presents with  . Influenza     (Consider location/radiation/quality/duration/timing/severity/associated sxs/prior Treatment) HPI Comments: 20 year old female with past medical history including Crohn's disease not currently on medication, GERD, anxiety who presents with sore throat. Patient states that earlier this afternoon, she began having a sore throat associated with not feeling well and body aches. She had one episode of vomiting. She denies any cough or nasal congestion. No fevers or diarrhea. She states she is not currently on any medication for Crohn's as it has been under control. She took Tylenol earlier this afternoon.  Patient is a 20 y.o. female presenting with flu symptoms. The history is provided by the patient.  Influenza   Past Medical History  Diagnosis Date  . Allergy   . Dysmenorrhea   . Abdominal pain, recurrent   . Appendicitis   . Crohn's disease of colon ([place]) 10/14    sees Dr. Blanca Friend at [facility]   . Shortness of breath dyspnea     ith thyroid problem  . Anxiety   . GERD (gastroesophageal reflux disease)     not taking any medication   Past Surgical History  Procedure Laterality Date  . Appendectomy  07-14-11    laparoscopic   . Esophagogastroduodenoscopy  01/28/2012    Procedure: ESOPHAGOGASTRODUODENOSCOPY (EGD);  Surgeon: Oletha Blend, MD;  Location: [place];  Service: Gastroenterology;  Laterality: N/A;  . Wisdom tooth extraction    . Cholecystectomy  02/11/2012    Procedure: LAPAROSCOPIC CHOLECYSTECTOMY WITH INTRAOPERATIVE CHOLANGIOGRAM;  Surgeon: Gwenyth Ober, MD;  Location: [place];  Service: General;  Laterality: N/A;  . Thyroid lobectomy Left 04/29/2015    Procedure: LEFT TOTAL THYROID LOBECTOMY;  Surgeon: Judeth Horn, MD;  Location: [facility] OR;  Service: General;  Laterality: Left;   Family  History  Problem Relation Age of Onset  . Cancer Paternal Aunt     ovarian, kidney  . Cancer Paternal Uncle     colon  . Colon cancer Paternal Uncle   . Cancer Maternal Grandmother     breast   . Hypertension Maternal Grandmother   . Irritable bowel syndrome Maternal Grandmother   . Heart disease Maternal Grandmother   . Hypertension Paternal Grandmother   . Hyperlipidemia Paternal Grandmother    Social History  Substance Use Topics  . Smoking status: Never Smoker   . Smokeless tobacco: Never Used  . Alcohol Use: 0.0 oz/week    0 Standard drinks or equivalent per week     Comment: occ   OB History    Gravida Para Term Preterm AB TAB SAB Ectopic Multiple Living   0              Review of Systems 10 Systems reviewed and are negative for acute change except as noted in the HPI.    Allergies  Fentanyl; Morphine and related; Oxycontin; Penicillins; Prednisone; and Valium  Home Medications   Prior to Admission medications   Medication Sig Start Date End Date Taking? Authorizing Provider  acetaminophen (TYLENOL) 500 MG tablet Take 1,500 mg by mouth every 8 (eight) hours as needed for mild pain, moderate pain or headache. Reported on 12/02/2015   Yes Historical Provider, MD   BP 121/71 mmHg  Pulse 91  Temp(Src) 99.5 F (37.5 C) (Oral)  Resp 18  Ht 5'  7" (1.702 m)  Wt 177 lb (80.287 kg)  BMI 27.72 kg/m2  SpO2 100%  LMP 01/04/2016 Physical Exam  Constitutional: She is oriented to person, place, and time. She appears well-developed and well-nourished. No distress.  HENT:  Head: Normocephalic and atraumatic.  Mouth/Throat: Oropharynx is clear and moist.  Moist mucous membranes  Eyes: Conjunctivae are normal. Pupils are equal, round, and reactive to light.  Neck: Neck supple.  Cardiovascular: Normal rate, regular rhythm and normal heart sounds.   No murmur heard. Pulmonary/Chest: Effort normal and breath sounds normal.  Abdominal: Soft. Bowel sounds are normal. She  exhibits no distension. There is no tenderness.  Musculoskeletal: She exhibits no edema.  Neurological: She is alert and oriented to person, place, and time.  Fluent speech  Skin: Skin is warm and dry. No rash noted.  Psychiatric: Judgment normal.  Flat affect  Nursing note and vitals reviewed.   ED Course  Procedures (including critical care time) Labs Review Labs Reviewed  RAPID STREP SCREEN (NOT AT [facility])  CULTURE, GROUP A STREP [facility])    Imaging Review No results found. I have personally reviewed and evaluated these lab results as part of my medical decision-making.   EKG Interpretation None     Medications  acetaminophen (TYLENOL) tablet 650 mg (650 mg Oral Given 01/28/16 0450)    MDM   Final diagnoses:  Viral syndrome   Pt w/ sore throat, malaise, and body aches that began this afternoon. She was well-appearing with normal vital signs at presentation. Reassuring physical exam. Rapid strep negative. Gave Tylenol in triage. Symptoms are consistent with a viral syndrome. Discussed supportive care including hydration and Tylenol/Motrin as needed. Reviewed return precautions including worsening symptoms, shortness of breath, or intractable vomiting. Patient voiced understanding and was discharged in satisfactory condition.  Sharlett Iles, MD 01/28/16 [phone]

## 2016-01-28 NOTE — ED Notes (Signed)
Patient stated at work her throat started feeling funny. Patient woke up this am and felt sick. She was having body aches and sore throat.

## 2016-01-30 LAB — CULTURE, GROUP A STREP (THRC)

## 2016-03-03 ENCOUNTER — Encounter: Payer: Self-pay | Admitting: Family Medicine

## 2016-03-03 ENCOUNTER — Ambulatory Visit (INDEPENDENT_AMBULATORY_CARE_PROVIDER_SITE_OTHER): Payer: 59 | Admitting: Family Medicine

## 2016-03-03 VITALS — BP 102/77 | HR 80 | Temp 98.4°F | Ht 67.0 in | Wt 198.0 lb

## 2016-03-03 DIAGNOSIS — M546 Pain in thoracic spine: Secondary | ICD-10-CM

## 2016-03-03 DIAGNOSIS — R5383 Other fatigue: Secondary | ICD-10-CM | POA: Diagnosis not present

## 2016-03-03 LAB — HEPATIC FUNCTION PANEL
ALK PHOS: 71 U/L (ref 39–117)
ALT: 9 U/L (ref 0–35)
AST: 13 U/L (ref 0–37)
Albumin: 3.9 g/dL (ref 3.5–5.2)
BILIRUBIN DIRECT: 0.1 mg/dL (ref 0.0–0.3)
BILIRUBIN TOTAL: 0.6 mg/dL (ref 0.2–1.2)
Total Protein: 7.5 g/dL (ref 6.0–8.3)

## 2016-03-03 LAB — BASIC METABOLIC PANEL
BUN: 11 mg/dL (ref 6–23)
CHLORIDE: 104 meq/L (ref 96–112)
CO2: 29 meq/L (ref 19–32)
Calcium: 9.2 mg/dL (ref 8.4–10.5)
Creatinine, Ser: 0.67 mg/dL (ref 0.40–1.20)
GFR: 141.96 mL/min (ref >60.00)
Glucose, Bld: 83 mg/dL (ref 70–99)
Potassium: 3.7 mEq/L (ref 3.5–5.1)
SODIUM: 139 meq/L (ref 135–145)

## 2016-03-03 LAB — CBC WITH DIFFERENTIAL/PLATELET
BASOS PCT: 0.8 % (ref 0.0–3.0)
Basophils Absolute: 0 10*3/uL (ref 0.0–0.1)
EOS ABS: 0 10*3/uL (ref 0.0–0.7)
Eosinophils Relative: 1 % (ref 0.0–5.0)
HEMATOCRIT: 38.8 % (ref 36.0–46.0)
Hemoglobin: 13.3 g/dL (ref 12.0–15.0)
LYMPHS PCT: 37.2 % (ref 12.0–46.0)
Lymphs Abs: 1.3 10*3/uL (ref 0.7–4.0)
MCHC: 34.4 g/dL (ref 30.0–36.0)
MCV: 88.3 fl (ref 78.0–100.0)
MONO ABS: 0.3 10*3/uL (ref 0.1–1.0)
Monocytes Relative: 8.5 % (ref 3.0–12.0)
NEUTROS ABS: 1.9 10*3/uL (ref 1.4–7.7)
Neutrophils Relative %: 52.5 % (ref 43.0–77.0)
PLATELETS: 277 10*3/uL (ref 150.0–400.0)
RBC: 4.39 Mil/uL (ref 3.87–5.11)
RDW: 12.4 % (ref 11.5–15.5)
WBC: 3.6 10*3/uL — ABNORMAL LOW (ref 4.0–10.5)

## 2016-03-03 LAB — TSH: TSH: 3.18 u[IU]/mL (ref 0.35–4.50)

## 2016-03-03 MED ORDER — CYCLOBENZAPRINE HCL 10 MG PO TABS: 10.0000 mg | ORAL_TABLET | Freq: Three times a day (TID) | ORAL | Status: DC | PRN

## 2016-03-03 NOTE — Progress Notes (Signed)
Pre visit review using our clinic review tool, if applicable. No additional management support is needed unless otherwise documented below in the visit note. 
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
## 2016-03-16 ENCOUNTER — Other Ambulatory Visit: Payer: Self-pay | Admitting: Orthopedic Surgery

## 2016-03-16 DIAGNOSIS — M545 Low back pain: Secondary | ICD-10-CM

## 2016-03-25 ENCOUNTER — Other Ambulatory Visit: Payer: 59
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
## 2016-08-17 ENCOUNTER — Ambulatory Visit (INDEPENDENT_AMBULATORY_CARE_PROVIDER_SITE_OTHER): Payer: 59 | Admitting: Family Medicine

## 2016-08-17 ENCOUNTER — Encounter: Payer: Self-pay | Admitting: Family Medicine

## 2016-08-17 VITALS — BP 106/77 | HR 69 | Temp 98.3°F | Ht 67.0 in | Wt 213.0 lb

## 2016-08-17 DIAGNOSIS — J029 Acute pharyngitis, unspecified: Secondary | ICD-10-CM | POA: Diagnosis not present

## 2016-08-17 MED ORDER — CEPHALEXIN 500 MG PO CAPS: 500.0000 mg | ORAL_CAPSULE | Freq: Three times a day (TID) | ORAL | 0 refills | Status: AC

## 2016-08-17 NOTE — Progress Notes (Signed)
   Subjective:    Patient ID: Julie Ward, female    DOB: 1996/05/21, 20 y.o.   MRN: [id number]  HPI Here for 4 days of PND, ST, and a dry cough. No fever. Taking Tylenol.    Review of Systems  Constitutional: Negative.   HENT: Positive for congestion, postnasal drip and sore throat. Negative for sinus pressure.   Eyes: Negative.   Respiratory: Positive for cough.        Objective:   Physical Exam  Constitutional: She appears well-developed and well-nourished.  HENT:  Right Ear: External ear normal.  Left Ear: External ear normal.  Nose: Nose normal.  Posterior OP is red without exudate   Eyes: Conjunctivae are normal.  Neck: Neck supple. No thyromegaly present.  Pulmonary/Chest: Effort normal and breath sounds normal.  Lymphadenopathy:    She has no cervical adenopathy.          Assessment & Plan:  Pharyngitis, treat with Keflex.  Laurey Morale, MD

## 2016-08-17 NOTE — Progress Notes (Signed)
Pre visit review using our clinic review tool, if applicable. No additional management support is needed unless otherwise documented below in the visit note. 

## 2016-11-20 ENCOUNTER — Encounter (HOSPITAL_COMMUNITY): Payer: Self-pay | Admitting: Emergency Medicine

## 2016-11-20 ENCOUNTER — Emergency Department (HOSPITAL_COMMUNITY)
Admission: EM | Admit: 2016-11-20 | Discharge: 2016-11-20 | Disposition: A | Discharge status: Home / Self Care | Payer: Managed Care, Other (non HMO) | Attending: Emergency Medicine | Admitting: Emergency Medicine

## 2016-11-20 DIAGNOSIS — Z79899 Other long term (current) drug therapy: Secondary | ICD-10-CM | POA: Insufficient documentation

## 2016-11-20 DIAGNOSIS — R1032 Left lower quadrant pain: Secondary | ICD-10-CM | POA: Insufficient documentation

## 2016-11-20 DIAGNOSIS — R109 Unspecified abdominal pain: Secondary | ICD-10-CM

## 2016-11-20 DIAGNOSIS — R112 Nausea with vomiting, unspecified: Secondary | ICD-10-CM | POA: Diagnosis present

## 2016-11-20 LAB — COMPREHENSIVE METABOLIC PANEL
ALK PHOS: 73 U/L (ref 38–126)
ALT: 52 U/L (ref 14–54)
AST: 36 U/L (ref 15–41)
Albumin: 4 g/dL (ref 3.5–5.0)
Anion gap: 8 (ref 5–15)
BUN: 9 mg/dL (ref 6–20)
CALCIUM: 8.7 mg/dL — AB (ref 8.9–10.3)
CO2: 26 mmol/L (ref 22–32)
CREATININE: 0.69 mg/dL (ref 0.44–1.00)
Chloride: 101 mmol/L (ref 101–111)
GFR calc non Af Amer: >60 mL/min (ref >60)
Glucose, Bld: 81 mg/dL (ref 65–99)
Potassium: 3.4 mmol/L — ABNORMAL LOW (ref 3.5–5.1)
SODIUM: 135 mmol/L (ref 135–145)
Total Bilirubin: 0.6 mg/dL (ref 0.3–1.2)
Total Protein: 8 g/dL (ref 6.5–8.1)

## 2016-11-20 LAB — URINALYSIS, ROUTINE W REFLEX MICROSCOPIC
BILIRUBIN URINE: NEGATIVE
GLUCOSE, UA: NEGATIVE mg/dL
HGB URINE DIPSTICK: NEGATIVE
Ketones, ur: NEGATIVE mg/dL
Leukocytes, UA: NEGATIVE
Nitrite: NEGATIVE
PROTEIN: NEGATIVE mg/dL
Specific Gravity, Urine: 1.03 (ref 1.005–1.030)
pH: 6 (ref 5.0–8.0)

## 2016-11-20 LAB — CBC
HCT: 38.9 % (ref 36.0–46.0)
Hemoglobin: 13 g/dL (ref 12.0–15.0)
MCH: 29.3 pg (ref 26.0–34.0)
MCHC: 33.4 g/dL (ref 30.0–36.0)
MCV: 87.6 fL (ref 78.0–100.0)
PLATELETS: 266 10*3/uL (ref 150–400)
RBC: 4.44 MIL/uL (ref 3.87–5.11)
RDW: 12.1 % (ref 11.5–15.5)
WBC: 5.4 10*3/uL (ref 4.0–10.5)

## 2016-11-20 LAB — I-STAT BETA HCG BLOOD, ED (MC, WL, AP ONLY)

## 2016-11-20 LAB — LIPASE, BLOOD: Lipase: 27 U/L (ref 11–51)

## 2016-11-20 MED ORDER — MORPHINE SULFATE (PF) 4 MG/ML IV SOLN
4.0000 mg | Freq: Once | INTRAVENOUS | Status: DC
  Filled 2016-11-20: qty 1

## 2016-11-20 MED ORDER — ONDANSETRON HCL 4 MG/2ML IJ SOLN
4.0000 mg | Freq: Once | INTRAMUSCULAR | Status: DC
  Filled 2016-11-20: qty 2

## 2016-11-20 MED ORDER — SODIUM CHLORIDE 0.9 % IV BOLUS (SEPSIS)
1000.0000 mL | Freq: Once | INTRAVENOUS | Status: AC
Start: 2016-11-20 — End: 2016-11-20
  Administered 2016-11-20: 1000 mL via INTRAVENOUS

## 2016-11-20 NOTE — ED Notes (Signed)
Pt waiting for U/S IV.

## 2016-11-20 NOTE — Discharge Instructions (Signed)
Continue taking your home medications as prescribed. Continue drinking fluids at home to remain hydrated. I recommend following up with the gastroenterology clinic listed below to establish care for further management of your Crohn's. Please return to the Emergency Department if symptoms worsen or new onset of fever, chest pain, difficulty breathing, vomiting, unable to keep fluids down, blood in vomit or stool.

## 2016-11-20 NOTE — ED Triage Notes (Signed)
Pt complaint of abdominal pain and nausea starting yesterday. Pt verbalizes "just one episode of vomiting" just PTA. Pt hx of Chron's and believes this is a flare. Pt denies diarrhea.

## 2016-11-20 NOTE — ED Provider Notes (Signed)
[facility] DEPT Provider Note   CSN: [id number] Arrival date & time: 11/20/16  1257     History   Chief Complaint Chief Complaint  Patient presents with  . Abdominal Pain  . Nausea    HPI Julie Ward is a 21 y.o. female.  HPI   Pt is a 21 yo female with PMH of Crohn's and GERD who presents to the ED with complaint of abdominal pain, onset last night. Pt reports waking up last night while she was sleeping due to onset of abdominal pain. Pt reports having diffuse waxing and wanning sharp/stabbing/burning pain across her abdomen which she states is worse on the left side. Pain is worse when laying supine. Denies radiation. Endorses associated nausea and NBNB vomiting x1. Pt reports her abdominal pain feel consistent with a Crohn's flare. Denies fever, chills, cough, SOB, CP, hematemesis, diarrhea, constipation, urinary sxs, vaginal bleeding, vaginal d/c, flank pain. Denies taking any medications at home for her sxs. Pt reports she was taking bentyl, pepcid, budesonide for her Crohn's but states she stopped taking them a few weeks ago due to the medications making her feel worse. Pt reports she was followed by GI at [facility] but notes she has not seen them in the past 2 years, last colonoscopy appx 3 years ago. Reports abdominal surgical hx of appendectomy and cholecystectomy. LMP 10 days ago.   Past Medical History:  Diagnosis Date  . Abdominal pain, recurrent   . Allergy   . Anxiety   . Appendicitis   . Crohn's disease of colon ([place]) 10/14   sees Dr. Blanca Friend at [facility]   . Dysmenorrhea   . GERD (gastroesophageal reflux disease)    not taking any medication  . Shortness of breath dyspnea    ith thyroid problem    Patient Active Problem List   Diagnosis Date Noted  . Goiter, non-toxic 04/29/2015  . Vomiting 10/05/2014  . Hypokalemia 10/05/2014  . Elevated LFTs 10/05/2014  . Nausea and vomiting 10/04/2014  . Thyroid nodule 10/04/2014  . Abdominal pain 10/04/2014    . Low TSH level 07/18/2014  . Thyromegaly 06/06/2014  . Hives 03/08/2014  . Crohn's disease ([place]) 03/01/2014  . Heme + stool 08/28/2013  . Abdominal pain, other specified site 08/28/2013  . Change in bowel habits 08/28/2013  . Postop check 03/03/2012  . Generalized abdominal pain   . PITYRIASIS ROSEA 09/11/2010  . GERD 07/23/2010  . DYSMENORRHEA 07/23/2010  . BOILS, RECURRENT 10/22/2008    Past Surgical History:  Procedure Laterality Date  . APPENDECTOMY  07-14-11   laparoscopic   . CHOLECYSTECTOMY  02/11/2012   Procedure: LAPAROSCOPIC CHOLECYSTECTOMY WITH INTRAOPERATIVE CHOLANGIOGRAM;  Surgeon: Gwenyth Ober, MD;  Location: [facility];  Service: General;  Laterality: N/A;  . ESOPHAGOGASTRODUODENOSCOPY  01/28/2012   Procedure: ESOPHAGOGASTRODUODENOSCOPY (EGD);  Surgeon: Oletha Blend, MD;  Location: [facility];  Service: Gastroenterology;  Laterality: N/A;  . THYROID LOBECTOMY Left 04/29/2015   Procedure: LEFT TOTAL THYROID LOBECTOMY;  Surgeon: Judeth Horn, MD;  Location: [facility];  Service: General;  Laterality: Left;  . WISDOM TOOTH EXTRACTION      OB History    Gravida Para Term Preterm AB Living   0             SAB TAB Ectopic Multiple Live Births                   Home Medications    Prior to Admission medications   Medication  Sig Start Date End Date Taking? Authorizing Provider  acetaminophen (TYLENOL) 500 MG tablet Take 1,000 mg by mouth every 8 (eight) hours as needed for mild pain, moderate pain or headache. Reported on 12/02/2015   Yes Historical Provider, MD    Family History Family History  Problem Relation Age of Onset  . Cancer Paternal Aunt     ovarian, kidney  . Cancer Paternal Uncle     colon  . Colon cancer Paternal Uncle   . Cancer Maternal Grandmother     breast   . Hypertension Maternal Grandmother   . Irritable bowel syndrome Maternal Grandmother   . Heart disease Maternal Grandmother   . Hypertension Paternal Grandmother   . Hyperlipidemia Paternal  Grandmother     Social History Social History  Substance Use Topics  . Smoking status: Never Smoker  . Smokeless tobacco: Never Used  . Alcohol use 0.0 oz/week     Comment: occ     Allergies   Fentanyl; Morphine and related; Oxycontin [oxycodone hcl]; Penicillins; Prednisone; and Valium [diazepam]   Review of Systems Review of Systems  Gastrointestinal: Positive for abdominal pain, nausea and vomiting.  All other systems reviewed and are negative.    Physical Exam Updated Vital Signs BP 127/68 (BP Location: Right Arm)   Pulse 82   Temp 98.3 F (36.8 C) (Oral)   Resp 12   Ht 5' 7"  (1.702 m)   Wt 98.9 kg   LMP 11/08/2016   SpO2 100%   BMI 34.14 kg/m   Physical Exam  Constitutional: She is oriented to person, place, and time. She appears well-developed and well-nourished. No distress.  HENT:  Head: Normocephalic and atraumatic.  Mouth/Throat: Uvula is midline, oropharynx is clear and moist and mucous membranes are normal. No oropharyngeal exudate, posterior oropharyngeal edema, posterior oropharyngeal erythema or tonsillar abscesses. No tonsillar exudate.  Eyes: Conjunctivae and EOM are normal. Right eye exhibits no discharge. Left eye exhibits no discharge. No scleral icterus.  Neck: Normal range of motion. Neck supple.  Cardiovascular: Normal rate, regular rhythm, normal heart sounds and intact distal pulses.   Pulmonary/Chest: Effort normal and breath sounds normal. No respiratory distress. She has no wheezes. She has no rales. She exhibits no tenderness.  Abdominal: Soft. Bowel sounds are normal. She exhibits no distension and no mass. There is tenderness. There is no rigidity, no rebound, no guarding and no CVA tenderness. No hernia.  Mild diffuse abdominal tenderness, worse over LLQ  Musculoskeletal: She exhibits no edema.  Neurological: She is alert and oriented to person, place, and time.  Skin: Skin is warm and dry. She is not diaphoretic.  Nursing note and  vitals reviewed.    ED Treatments / Results  Labs (all labs ordered are listed, but only abnormal results are displayed) Labs Reviewed  COMPREHENSIVE METABOLIC PANEL - Abnormal; Notable for the following:       Result Value   Potassium 3.4 (*)    Calcium 8.7 (*)    All other components within normal limits  LIPASE, BLOOD  CBC  URINALYSIS, ROUTINE W REFLEX MICROSCOPIC  I-STAT BETA HCG BLOOD, ED ([facility], [facility], [facility] ONLY)    EKG  EKG Interpretation None       Radiology No results found.  Procedures Procedures (including critical care time)  Medications Ordered in ED Medications  ondansetron (ZOFRAN) injection 4 mg (4 mg Intravenous Refused 11/20/16 1754)  morphine 4 MG/ML injection 4 mg (4 mg Intravenous Refused 11/20/16 1754)  sodium chloride 0.9 %  bolus 1,000 mL (1,000 mLs Intravenous New Bag/Given 11/20/16 1754)     Initial Impression / Assessment and Plan / ED Course  I have reviewed the triage vital signs and the nursing notes.  Pertinent labs & imaging results that were available during my care of the patient were reviewed by me and considered in my medical decision making (see chart for details).  Clinical Course     Patient presents with left sided abdominal pain that started last night. Reports pain feels similar to Crohn's flare she has had in the past. Patient reports she was seen GI at [facility] but notes she has not seen them over the past 2 years. Denies taking any of her home meds for her Crohn's. Denies being on any steroids due to allergic reaction. Denies fever. VSS. Exam revealed mild diffuse abdominal tenderness, slightly worse over left lower quadrant. Remaining exam unremarkable. Patient given IV fluids, Zofran and morphine. Pregnancy negative. Labs revealed potassium 3.4, plan of giving patient oral potassium replacement. Remaining labs and urine unremarkable. On reevaluation patient reports she refused medications due to being upset after having a difficult  IV placed and states she feels slightly better and wants to go home. On repeat exam patient does not have a surgical abdomin and there are no peritoneal signs.  No indication of infection, appendicitis, bowel obstruction, bowel perforation, cholecystitis, diverticulitis, PID or ectopic pregnancy. Nurse reports that patient began shouting and was refusing medications. Patient has only received IV fluids while in the ED. Discussed results with patient. Patient states she is not wants take any medications in the ED and will take her home prescription of potassium when she gets home tonight. Patient able to tolerate by mouth in the ED. Plan to discharge patient home with symptomatic treatment and advised her to continue taking her home medications as prescribed. Patient given information to follow-up with gastroenterology outpatient. Discussed return precautions with patient. Patient reports understanding and agreement.     Final Clinical Impressions(s) / ED Diagnoses   Final diagnoses:  Abdominal pain, unspecified abdominal location    New Prescriptions New Prescriptions   No medications on file     Nona Dell, PA-C 11/20/16 1903    Quintella Reichert, MD 11/21/16 [phone]

## 2016-11-20 NOTE — ED Notes (Signed)
I attempted to collect labs and was unsuccessful. 

## 2017-05-06 ENCOUNTER — Encounter (HOSPITAL_COMMUNITY): Payer: Self-pay | Admitting: *Deleted

## 2017-05-06 ENCOUNTER — Emergency Department (HOSPITAL_COMMUNITY)
Admission: EM | Admit: 2017-05-06 | Discharge: 2017-05-06 | Disposition: A | Discharge status: Home / Self Care | Payer: 59 | Attending: Emergency Medicine | Admitting: Emergency Medicine

## 2017-05-06 DIAGNOSIS — R1110 Vomiting, unspecified: Secondary | ICD-10-CM

## 2017-05-06 DIAGNOSIS — K5090 Crohn's disease, unspecified, without complications: Secondary | ICD-10-CM | POA: Insufficient documentation

## 2017-05-06 DIAGNOSIS — R112 Nausea with vomiting, unspecified: Secondary | ICD-10-CM | POA: Diagnosis not present

## 2017-05-06 DIAGNOSIS — R197 Diarrhea, unspecified: Secondary | ICD-10-CM | POA: Insufficient documentation

## 2017-05-06 LAB — URINALYSIS, ROUTINE W REFLEX MICROSCOPIC
BILIRUBIN URINE: NEGATIVE
Glucose, UA: NEGATIVE mg/dL
KETONES UR: 5 mg/dL — AB
LEUKOCYTES UA: NEGATIVE
Nitrite: NEGATIVE
PH: 5 (ref 5.0–8.0)
Protein, ur: NEGATIVE mg/dL
Specific Gravity, Urine: 1.023 (ref 1.005–1.030)

## 2017-05-06 LAB — COMPREHENSIVE METABOLIC PANEL
ALK PHOS: 68 U/L (ref 38–126)
ALT: 12 U/L — AB (ref 14–54)
AST: 20 U/L (ref 15–41)
Albumin: 3.9 g/dL (ref 3.5–5.0)
Anion gap: 9 (ref 5–15)
BUN: 7 mg/dL (ref 6–20)
CALCIUM: 8.6 mg/dL — AB (ref 8.9–10.3)
CO2: 22 mmol/L (ref 22–32)
CREATININE: 0.67 mg/dL (ref 0.44–1.00)
Chloride: 105 mmol/L (ref 101–111)
GFR calc non Af Amer: >60 mL/min (ref >60)
Glucose, Bld: 87 mg/dL (ref 65–99)
Potassium: 3.4 mmol/L — ABNORMAL LOW (ref 3.5–5.1)
Sodium: 136 mmol/L (ref 135–145)
Total Bilirubin: 0.9 mg/dL (ref 0.3–1.2)
Total Protein: 8.2 g/dL — ABNORMAL HIGH (ref 6.5–8.1)

## 2017-05-06 LAB — CBC
HCT: 37.5 % (ref 36.0–46.0)
Hemoglobin: 12.9 g/dL (ref 12.0–15.0)
MCH: 29.9 pg (ref 26.0–34.0)
MCHC: 34.4 g/dL (ref 30.0–36.0)
MCV: 86.8 fL (ref 78.0–100.0)
PLATELETS: 262 10*3/uL (ref 150–400)
RBC: 4.32 MIL/uL (ref 3.87–5.11)
RDW: 12.3 % (ref 11.5–15.5)
WBC: 4.5 10*3/uL (ref 4.0–10.5)

## 2017-05-06 LAB — I-STAT BETA HCG BLOOD, ED (MC, WL, AP ONLY): I-stat hCG, quantitative: <5 m[IU]/mL (ref <5)

## 2017-05-06 LAB — LIPASE, BLOOD: LIPASE: 31 U/L (ref 11–51)

## 2017-05-06 LAB — POC URINE PREG, ED: Preg Test, Ur: NEGATIVE

## 2017-05-06 MED ORDER — POTASSIUM CHLORIDE CRYS ER 20 MEQ PO TBCR: 40.0000 meq | EXTENDED_RELEASE_TABLET | Freq: Every day | ORAL | 0 refills | Status: DC

## 2017-05-06 MED ORDER — ONDANSETRON 4 MG PO TBDP: 4.0000 mg | ORAL_TABLET | ORAL | 0 refills | Status: DC | PRN

## 2017-05-06 MED ORDER — SODIUM CHLORIDE 0.9 % IV BOLUS (SEPSIS)
1000.0000 mL | Freq: Once | INTRAVENOUS | Status: AC
  Administered 2017-05-06: 1000 mL via INTRAVENOUS

## 2017-05-06 MED ORDER — FAMOTIDINE 20 MG PO TABS: 20.0000 mg | ORAL_TABLET | Freq: Two times a day (BID) | ORAL | 0 refills | Status: DC

## 2017-05-06 MED ORDER — FAMOTIDINE IN NACL 20-0.9 MG/50ML-% IV SOLN
20.0000 mg | Freq: Once | INTRAVENOUS | Status: AC
  Administered 2017-05-06: 20 mg via INTRAVENOUS
  Filled 2017-05-06: qty 50

## 2017-05-06 MED ORDER — ONDANSETRON HCL 4 MG/2ML IJ SOLN
4.0000 mg | Freq: Once | INTRAMUSCULAR | Status: AC
  Administered 2017-05-06: 4 mg via INTRAVENOUS
  Filled 2017-05-06: qty 2

## 2017-05-06 NOTE — ED Notes (Signed)
Patient ambulated to bathroom.

## 2017-05-06 NOTE — ED Provider Notes (Signed)
[facility] DEPT Provider Note   CSN: [id number] Arrival date & time: 05/06/17  0086     History   Chief Complaint No chief complaint on file.   HPI Julie Ward is a 21 y.o. female.  HPI Patient reports symptoms started quite suddenly yesterday evening. She got recurrent vomiting and diarrhea. No blood in the stool. She reports sharp cramping and lancinating pain from the left upper abdomen. No fever. Patient reports the last thing she ate was yesterday afternoon with some [place] steak she made it home. She denies sick contact. She reports that is typical for her Crohn's to come on very suddenly in a similar fashion. Patient has not been taking any specific medication treatment because she reports that the medications she had been prescribed caused her to have thyroid dysfunction and have to have surgery. She used to be seen at a gastroenterologist at [place] but has not been there in several years. Past Medical History:  Diagnosis Date  . Abdominal pain, recurrent   . Allergy   . Anxiety   . Appendicitis   . Crohn's disease of colon ([place]) 10/14   sees Dr. Blanca Friend at [place]   . Dysmenorrhea   . GERD (gastroesophageal reflux disease)    not taking any medication  . Shortness of breath dyspnea    ith thyroid problem    Patient Active Problem List   Diagnosis Date Noted  . Goiter, non-toxic 04/29/2015  . Vomiting 10/05/2014  . Hypokalemia 10/05/2014  . Elevated LFTs 10/05/2014  . Nausea and vomiting 10/04/2014  . Thyroid nodule 10/04/2014  . Abdominal pain 10/04/2014  . Low TSH level 07/18/2014  . Thyromegaly 06/06/2014  . Hives 03/08/2014  . Crohn's disease ([place]) 03/01/2014  . Heme + stool 08/28/2013  . Abdominal pain, other specified site 08/28/2013  . Change in bowel habits 08/28/2013  . Postop check 03/03/2012  . Generalized abdominal pain   . PITYRIASIS ROSEA 09/11/2010  . GERD 07/23/2010  . DYSMENORRHEA 07/23/2010  . BOILS,  RECURRENT 10/22/2008    Past Surgical History:  Procedure Laterality Date  . APPENDECTOMY  07-14-11   laparoscopic   . CHOLECYSTECTOMY  02/11/2012   Procedure: LAPAROSCOPIC CHOLECYSTECTOMY WITH INTRAOPERATIVE CHOLANGIOGRAM;  Surgeon: Gwenyth Ober, MD;  Location: [facility];  Service: General;  Laterality: N/A;  . ESOPHAGOGASTRODUODENOSCOPY  01/28/2012   Procedure: ESOPHAGOGASTRODUODENOSCOPY (EGD);  Surgeon: Oletha Blend, MD;  Location: [facility];  Service: Gastroenterology;  Laterality: N/A;  . THYROID LOBECTOMY Left 04/29/2015   Procedure: LEFT TOTAL THYROID LOBECTOMY;  Surgeon: Judeth Horn, MD;  Location: [facility];  Service: General;  Laterality: Left;  . WISDOM TOOTH EXTRACTION      OB History    Gravida Para Term Preterm AB Living   0             SAB TAB Ectopic Multiple Live Births                   Home Medications    Prior to Admission medications   Medication Sig Start Date End Date Taking? Authorizing Provider  acetaminophen (TYLENOL) 500 MG tablet Take 1,000 mg by mouth every 8 (eight) hours as needed for mild pain, moderate pain or headache. Reported on 12/02/2015   Yes [provider]  famotidine (PEPCID) 20 MG tablet Take 1 tablet (20 mg total) by mouth 2 (two) times daily. 05/06/17   Charlesetta Shanks, MD  ondansetron (ZOFRAN ODT) 4 MG disintegrating tablet Take  1 tablet (4 mg total) by mouth every 4 (four) hours as needed for nausea or vomiting. 05/06/17   Charlesetta Shanks, MD  potassium chloride SA (K-DUR,KLOR-CON) 20 MEQ tablet Take 2 tablets (40 mEq total) by mouth daily. 05/06/17   Charlesetta Shanks, MD    Family History Family History  Problem Relation Age of Onset  . Cancer Paternal Aunt        ovarian, kidney  . Cancer Paternal Uncle        colon  . Colon cancer Paternal Uncle   . Cancer Maternal Grandmother        breast   . Hypertension Maternal Grandmother   . Irritable bowel syndrome Maternal Grandmother   . Heart disease Maternal Grandmother   .  Hypertension Paternal Grandmother   . Hyperlipidemia Paternal Grandmother     Social History Social History  Substance Use Topics  . Smoking status: Never Smoker  . Smokeless tobacco: Never Used  . Alcohol use 0.0 oz/week     Comment: occ     Allergies   Fentanyl; Morphine and related; Oxycontin [oxycodone hcl]; Penicillins; Prednisone; and Valium [diazepam]   Review of Systems Review of Systems 10 Systems reviewed and are negative for acute change except as noted in the HPI.   Physical Exam Updated Vital Signs BP 115/69   Pulse 85   Temp 98.4 F (36.9 C) (Oral)   Resp 16   LMP 03/17/2017 (Approximate)   SpO2 100%   Physical Exam  Constitutional: She is oriented to person, place, and time. She appears well-developed and well-nourished. No distress.  Patient is alert and nontoxic. Moderate appearance of pain and discomfort. Clinically well and appearance. Moderate obesity.  HENT:  Head: Normocephalic and atraumatic.  Mouth/Throat: Oropharynx is clear and moist.  Eyes: Conjunctivae and EOM are normal.  Neck: Neck supple.  Cardiovascular: Normal rate and regular rhythm.   No murmur heard. Pulmonary/Chest: Effort normal and breath sounds normal. No respiratory distress.  Abdominal: Soft. There is tenderness.  Left upper quadrant tenderness to palpation. No guarding. Lower abdomen nontender.  Musculoskeletal: Normal range of motion. She exhibits no edema or tenderness.  Neurological: She is alert and oriented to person, place, and time. No cranial nerve deficit. She exhibits normal muscle tone. Coordination normal.  Skin: Skin is warm and dry.  Psychiatric: She has a normal mood and affect.  Nursing note and vitals reviewed.    ED Treatments / Results  Labs (all labs ordered are listed, but only abnormal results are displayed) Labs Reviewed  COMPREHENSIVE METABOLIC PANEL - Abnormal; Notable for the following:       Result Value   Potassium 3.4 (*)    Calcium  8.6 (*)    Total Protein 8.2 (*)    ALT 12 (*)    All other components within normal limits  URINALYSIS, ROUTINE W REFLEX MICROSCOPIC - Abnormal; Notable for the following:    APPearance HAZY (*)    Hgb urine dipstick LARGE (*)    Ketones, ur 5 (*)    Bacteria, UA RARE (*)    Squamous Epithelial / LPF 6-30 (*)    All other components within normal limits  URINE CULTURE  LIPASE, BLOOD  CBC  POC URINE PREG, ED  I-STAT BETA HCG BLOOD, ED ([facility], [facility], [facility] ONLY)    EKG  EKG Interpretation None       Radiology No results found.  Procedures Procedures (including critical care time)  Medications Ordered in ED Medications  ondansetron [facility]) injection 4 mg (4 mg Intravenous Given 05/06/17 1310)  sodium chloride 0.9 % bolus 1,000 mL (1,000 mLs Intravenous New Bag/Given 05/06/17 1311)  famotidine (PEPCID) IVPB 20 mg premix (0 mg Intravenous Stopped 05/06/17 1340)     Initial Impression / Assessment and Plan / ED Course  I have reviewed the triage vital signs and the nursing notes.  Pertinent labs & imaging results that were available during my care of the patient were reviewed by me and considered in my medical decision making (see chart for details).      Final Clinical Impressions(s) / ED Diagnoses   Final diagnoses:  Exacerbation of Crohn's disease without complication (HCC)  Vomiting and diarrhea   Symptoms improved. Patient is up and ambulatory in the emergency department. Abdominal examination is nonsurgical. I'll have her continue symptomatic treatment outpatient. Patient advises Crohn's flares are similar to this. A consideration however is also given to gastroenteritis with acuity of onset of both vomiting and diarrhea with no blood and no leukocytosis or fever. Patient counseled on following up with gastroenterology. New Prescriptions New Prescriptions   FAMOTIDINE (PEPCID) 20 MG TABLET    Take 1 tablet (20 mg total) by mouth 2 (two) times daily.   ONDANSETRON  (ZOFRAN ODT) 4 MG DISINTEGRATING TABLET    Take 1 tablet (4 mg total) by mouth every 4 (four) hours as needed for nausea or vomiting.   POTASSIUM CHLORIDE SA (K-DUR,KLOR-CON) 20 MEQ TABLET    Take 2 tablets (40 mEq total) by mouth daily.     Charlesetta Shanks, MD 05/06/17 [phone]

## 2017-05-06 NOTE — ED Triage Notes (Signed)
Pt reports a flare up of her chron's disease.  Pt reports n/v.  Pt a/o x 4 and ambulatory.

## 2017-05-08 LAB — URINE CULTURE

## 2017-05-31 ENCOUNTER — Ambulatory Visit (INDEPENDENT_AMBULATORY_CARE_PROVIDER_SITE_OTHER): Payer: 59 | Admitting: Family Medicine

## 2017-05-31 ENCOUNTER — Encounter: Payer: Self-pay | Admitting: Family Medicine

## 2017-05-31 VITALS — BP 110/66 | Temp 98.6°F | Ht 67.0 in | Wt 240.0 lb

## 2017-05-31 DIAGNOSIS — K5010 Crohn's disease of large intestine without complications: Secondary | ICD-10-CM | POA: Diagnosis not present

## 2017-05-31 NOTE — Patient Instructions (Signed)
WE NOW OFFER   [facility] FAST TRACK!!!  SAME DAY Appointments for ACUTE CARE  Such as: Sprains, Injuries, cuts, abrasions, rashes, muscle pain, joint pain, back pain Colds, flu, sore throats, headache, allergies, cough, fever  Ear pain, sinus and eye infections Abdominal pain, nausea, vomiting, diarrhea, upset stomach Animal/insect bites  3 Easy Ways to Schedule: Walk-In Scheduling Call in scheduling Mychart Sign-up: [email or web address]         

## 2017-05-31 NOTE — Progress Notes (Signed)
   Subjective:    Patient ID: Julie Ward, female    DOB: 07/19/1996, 21 y.o.   MRN: [id number]  HPI Here for help with forms from work to cover time missed for illness. She recently missed 3 days of work due to flares of her Crohns disease. This caused abdominal pains and diarrhea. She had to go got the ER on 2 of these days. She feels better now. Also she has been working hard to lose weight by eating smart and exercising, but she is not getting anywhere. She has had thyroid surgery. Her last TSH was done over a year ago.    Review of Systems  Constitutional: Negative.   Respiratory: Negative.   Cardiovascular: Negative.   Gastrointestinal: Positive for abdominal pain and diarrhea. Negative for anal bleeding, blood in stool, constipation and vomiting.       Objective:   Physical Exam  Constitutional: She appears well-developed and well-nourished.  Cardiovascular: Normal rate, regular rhythm, normal heart sounds and intact distal pulses.   Pulmonary/Chest: Effort normal and breath sounds normal.  Abdominal: Soft. Bowel sounds are normal. She exhibits no distension and no mass. There is no tenderness. There is no rebound and no guarding.          Assessment & Plan:  Her Crohns disease is doing well at the moment but she does have periods when it flares up. All the work forms are filled out today to permit her frequent bathroom breaks if needed. We will check a TSH today and will refer her to Nutrition to help with weight loss.  Alysia Penna, MD

## 2017-06-01 LAB — TSH: TSH: 3.43 u[IU]/mL (ref 0.35–4.50)

## 2017-06-09 ENCOUNTER — Encounter (HOSPITAL_COMMUNITY): Payer: Self-pay | Admitting: Emergency Medicine

## 2017-06-09 ENCOUNTER — Emergency Department (HOSPITAL_COMMUNITY)
Admission: EM | Admit: 2017-06-09 | Discharge: 2017-06-09 | Disposition: A | Discharge status: Home / Self Care | Payer: Managed Care, Other (non HMO) | Attending: Emergency Medicine | Admitting: Emergency Medicine

## 2017-06-09 DIAGNOSIS — R1084 Generalized abdominal pain: Secondary | ICD-10-CM | POA: Insufficient documentation

## 2017-06-09 DIAGNOSIS — R197 Diarrhea, unspecified: Secondary | ICD-10-CM | POA: Diagnosis not present

## 2017-06-09 DIAGNOSIS — R112 Nausea with vomiting, unspecified: Secondary | ICD-10-CM | POA: Diagnosis not present

## 2017-06-09 DIAGNOSIS — Z79899 Other long term (current) drug therapy: Secondary | ICD-10-CM | POA: Insufficient documentation

## 2017-06-09 DIAGNOSIS — R109 Unspecified abdominal pain: Secondary | ICD-10-CM

## 2017-06-09 LAB — CBC
HEMATOCRIT: 36.6 % (ref 36.0–46.0)
HEMOGLOBIN: 12.9 g/dL (ref 12.0–15.0)
MCH: 30.1 pg (ref 26.0–34.0)
MCHC: 35.2 g/dL (ref 30.0–36.0)
MCV: 85.3 fL (ref 78.0–100.0)
Platelets: 277 10*3/uL (ref 150–400)
RBC: 4.29 MIL/uL (ref 3.87–5.11)
RDW: 12.1 % (ref 11.5–15.5)
WBC: 5.9 10*3/uL (ref 4.0–10.5)

## 2017-06-09 LAB — URINALYSIS, ROUTINE W REFLEX MICROSCOPIC
Bilirubin Urine: NEGATIVE
Glucose, UA: NEGATIVE mg/dL
Ketones, ur: 80 mg/dL — AB
NITRITE: NEGATIVE
PROTEIN: 30 mg/dL — AB
Specific Gravity, Urine: 1.023 (ref 1.005–1.030)
pH: 5 (ref 5.0–8.0)

## 2017-06-09 LAB — COMPREHENSIVE METABOLIC PANEL
ALBUMIN: 3.7 g/dL (ref 3.5–5.0)
ALT: 11 U/L — ABNORMAL LOW (ref 14–54)
ANION GAP: 11 (ref 5–15)
AST: 17 U/L (ref 15–41)
Alkaline Phosphatase: 67 U/L (ref 38–126)
BILIRUBIN TOTAL: 0.8 mg/dL (ref 0.3–1.2)
BUN: 8 mg/dL (ref 6–20)
CHLORIDE: 104 mmol/L (ref 101–111)
CO2: 21 mmol/L — ABNORMAL LOW (ref 22–32)
Calcium: 8.7 mg/dL — ABNORMAL LOW (ref 8.9–10.3)
Creatinine, Ser: 0.72 mg/dL (ref 0.44–1.00)
GFR calc Af Amer: >60 mL/min (ref >60)
Glucose, Bld: 104 mg/dL — ABNORMAL HIGH (ref 65–99)
POTASSIUM: 3.3 mmol/L — AB (ref 3.5–5.1)
Sodium: 136 mmol/L (ref 135–145)
TOTAL PROTEIN: 8 g/dL (ref 6.5–8.1)

## 2017-06-09 LAB — I-STAT BETA HCG BLOOD, ED (MC, WL, AP ONLY)

## 2017-06-09 LAB — LIPASE, BLOOD: LIPASE: 30 U/L (ref 11–51)

## 2017-06-09 MED ORDER — HYDROMORPHONE HCL 1 MG/ML IJ SOLN
0.5000 mg | Freq: Once | INTRAMUSCULAR | Status: AC
  Administered 2017-06-09: 0.5 mg via INTRAVENOUS
  Filled 2017-06-09: qty 1

## 2017-06-09 MED ORDER — ONDANSETRON HCL 4 MG/2ML IJ SOLN
4.0000 mg | Freq: Once | INTRAMUSCULAR | Status: AC
  Administered 2017-06-09: 4 mg via INTRAVENOUS
  Filled 2017-06-09: qty 2

## 2017-06-09 MED ORDER — SODIUM CHLORIDE 0.9 % IV BOLUS (SEPSIS)
1000.0000 mL | Freq: Once | INTRAVENOUS | Status: AC
  Administered 2017-06-09: 1000 mL via INTRAVENOUS

## 2017-06-09 MED ORDER — DICYCLOMINE HCL 10 MG PO CAPS
20.0000 mg | ORAL_CAPSULE | Freq: Once | ORAL | Status: AC
  Administered 2017-06-09: 20 mg via ORAL
  Filled 2017-06-09: qty 2

## 2017-06-09 MED ORDER — FAMOTIDINE IN NACL 20-0.9 MG/50ML-% IV SOLN
20.0000 mg | INTRAVENOUS | Status: AC
  Administered 2017-06-09: 20 mg via INTRAVENOUS
  Filled 2017-06-09: qty 50

## 2017-06-09 MED ORDER — DICYCLOMINE HCL 10 MG/5ML PO SOLN: 20.0000 mg | Freq: Three times a day (TID) | ORAL | 0 refills | Status: DC | PRN

## 2017-06-09 MED ORDER — ONDANSETRON 4 MG PO TBDP: 4.0000 mg | ORAL_TABLET | Freq: Three times a day (TID) | ORAL | 0 refills | Status: DC | PRN

## 2017-06-09 NOTE — ED Provider Notes (Signed)
[facility] DEPT Provider Note   CSN: [id number] Arrival date & time: 06/09/17  1011     History   Chief Complaint Chief Complaint  Patient presents with  . Abdominal Pain    HPI Julie Ward is a 21 y.o. female.  The history is provided by the patient and medical records.  Abdominal Pain   Associated symptoms include diarrhea, nausea and vomiting.    21 year old female with history of anxiety, seasonal allergies, GERD, Crohn's disease, presenting to the ED for abdominal pain. Patient reports she feels she is having a Crohn's flare. States symptoms began on Monday of this week and have been progressively worsening. She reports associated nausea, vomiting, diarrhea. She has not had any blood in her emesis or stool. She denies any fever. Dates pain is mostly localized to her upper abdomen and periumbilical region, described as cramping with intermittent sharp, stabbing sensations. States she cannot take steroids during flares she had an allergic reaction to these in the past.  Past Medical History:  Diagnosis Date  . Abdominal pain, recurrent   . Allergy   . Anxiety   . Appendicitis   . Crohn's disease of colon ([place]) 10/14   sees Dr. Blanca Friend at [facility]   . Dysmenorrhea   . GERD (gastroesophageal reflux disease)    not taking any medication  . Shortness of breath dyspnea    ith thyroid problem    Patient Active Problem List   Diagnosis Date Noted  . Goiter, non-toxic 04/29/2015  . Vomiting 10/05/2014  . Hypokalemia 10/05/2014  . Elevated LFTs 10/05/2014  . Nausea and vomiting 10/04/2014  . Thyroid nodule 10/04/2014  . Abdominal pain 10/04/2014  . Low TSH level 07/18/2014  . Thyromegaly 06/06/2014  . Hives 03/08/2014  . Crohn's disease ([place]) 03/01/2014  . Heme + stool 08/28/2013  . Abdominal pain, other specified site 08/28/2013  . Change in bowel habits 08/28/2013  . Postop check 03/03/2012  . Generalized abdominal pain   . PITYRIASIS ROSEA  09/11/2010  . GERD 07/23/2010  . DYSMENORRHEA 07/23/2010  . BOILS, RECURRENT 10/22/2008    Past Surgical History:  Procedure Laterality Date  . APPENDECTOMY  07-14-11   laparoscopic   . CHOLECYSTECTOMY  02/11/2012   Procedure: LAPAROSCOPIC CHOLECYSTECTOMY WITH INTRAOPERATIVE CHOLANGIOGRAM;  Surgeon: Gwenyth Ober, MD;  Location: [facility];  Service: General;  Laterality: N/A;  . ESOPHAGOGASTRODUODENOSCOPY  01/28/2012   Procedure: ESOPHAGOGASTRODUODENOSCOPY (EGD);  Surgeon: Oletha Blend, MD;  Location: [facility];  Service: Gastroenterology;  Laterality: N/A;  . THYROID LOBECTOMY Left 04/29/2015   Procedure: LEFT TOTAL THYROID LOBECTOMY;  Surgeon: Judeth Horn, MD;  Location: [facility];  Service: General;  Laterality: Left;  . WISDOM TOOTH EXTRACTION      OB History    Gravida Para Term Preterm AB Living   0             SAB TAB Ectopic Multiple Live Births                   Home Medications    Prior to Admission medications   Medication Sig Start Date End Date Taking? Authorizing Provider  famotidine (PEPCID) 20 MG tablet Take 1 tablet (20 mg total) by mouth 2 (two) times daily. 05/06/17   Charlesetta Shanks, MD    Family History Family History  Problem Relation Age of Onset  . Cancer Paternal Aunt        ovarian, kidney  . Cancer Paternal Uncle  colon  . Colon cancer Paternal Uncle   . Cancer Maternal Grandmother        breast   . Hypertension Maternal Grandmother   . Irritable bowel syndrome Maternal Grandmother   . Heart disease Maternal Grandmother   . Hypertension Paternal Grandmother   . Hyperlipidemia Paternal Grandmother     Social History Social History  Substance Use Topics  . Smoking status: Never Smoker  . Smokeless tobacco: Never Used  . Alcohol use 0.0 oz/week     Comment: occ     Allergies   Fentanyl; Morphine and related; Oxycontin [oxycodone hcl]; Penicillins; Prednisone; and Valium [diazepam]   Review of Systems Review of Systems    Gastrointestinal: Positive for abdominal pain, diarrhea, nausea and vomiting.  All other systems reviewed and are negative.    Physical Exam Updated Vital Signs BP 121/72 (BP Location: Left Arm)   Pulse 75   Temp 99.3 F (37.4 C) (Oral)   Resp 16   Ht 5' 7"  (1.702 m)   Wt 99.8 kg (220 lb)   LMP 06/09/2017   SpO2 98%   BMI 34.46 kg/m   Physical Exam  Constitutional: She is oriented to person, place, and time. She appears well-developed and well-nourished.  HENT:  Head: Normocephalic and atraumatic.  Mouth/Throat: Oropharynx is clear and moist.  Eyes: Pupils are equal, round, and reactive to light. Conjunctivae and EOM are normal.  Neck: Normal range of motion.  Cardiovascular: Normal rate, regular rhythm and normal heart sounds.   Pulmonary/Chest: Effort normal and breath sounds normal. No respiratory distress. She has no wheezes.  Abdominal: Soft. Bowel sounds are normal. There is generalized tenderness and tenderness in the left upper quadrant. There is no rebound.  Musculoskeletal: Normal range of motion.  Neurological: She is alert and oriented to person, place, and time.  Skin: Skin is warm and dry.  Psychiatric: She has a normal mood and affect.  Nursing note and vitals reviewed.    ED Treatments / Results  Labs (all labs ordered are listed, but only abnormal results are displayed) Labs Reviewed  COMPREHENSIVE METABOLIC PANEL - Abnormal; Notable for the following:       Result Value   Potassium 3.3 (*)    CO2 21 (*)    Glucose, Bld 104 (*)    Calcium 8.7 (*)    ALT 11 (*)    All other components within normal limits  URINALYSIS, ROUTINE W REFLEX MICROSCOPIC - Abnormal; Notable for the following:    APPearance HAZY (*)    Hgb urine dipstick LARGE (*)    Ketones, ur 80 (*)    Protein, ur 30 (*)    Leukocytes, UA SMALL (*)    Bacteria, UA RARE (*)    Squamous Epithelial / LPF 6-30 (*)    All other components within normal limits  LIPASE, BLOOD  CBC   I-STAT BETA HCG BLOOD, ED ([facility], [facility], [facility] ONLY)    EKG  EKG Interpretation None       Radiology No results found.  Procedures Procedures (including critical care time)  Medications Ordered in ED Medications  sodium chloride 0.9 % bolus 1,000 mL (0 mLs Intravenous Stopped 06/09/17 1310)  ondansetron (ZOFRAN) injection 4 mg (4 mg Intravenous Given 06/09/17 1054)  famotidine (PEPCID) IVPB 20 mg premix (0 mg Intravenous Stopped 06/09/17 1127)  HYDROmorphone (DILAUDID) injection 0.5 mg (0.5 mg Intravenous Given 06/09/17 1054)  dicyclomine (BENTYL) capsule 20 mg (20 mg Oral Given 06/09/17 1310)  Initial Impression / Assessment and Plan / ED Course  I have reviewed the triage vital signs and the nursing notes.  Pertinent labs & imaging results that were available during my care of the patient were reviewed by me and considered in my medical decision making (see chart for details).  21 year old female here with abdominal pain. Reports history of Crohn's, feel she may be having a flare. She is afebrile and nontoxic. She has minimal discomfort in her epigastrium and left upper quadrant. No rebound or guarding. No peritoneal signs. She's not had any hematemesis or blood in the stool. We'll plan for screening labs. She was given IV fluids, Pepcid, Zofran, and small dose of pain medication.  Screening lab work overall reassuring. Potassium is mildly low at 3.3 which may be due to her vomiting. Normal white blood cell count. Lipase is normal. UA appears contaminated. Patient denies any urinary or gynecologic symptoms.  Patient is feeling better after treatment here. Her abdomen is soft and benign at this time. Symptoms today may represent Crohn's flare. She reports allergic reaction to prednisone the past so we'll treat symptomatically. I have strongly recommended that she follow-up with a GI physician as she has not seen one in about 2 years. Stepmother plans to help her establish care.  Discussed  plan with patient, she acknowledged understanding and agreed with plan of care.  Return precautions given for new or worsening symptoms.  Final Clinical Impressions(s) / ED Diagnoses   Final diagnoses:  Abdominal pain, unspecified abdominal location  Nausea vomiting and diarrhea    New Prescriptions Discharge Medication List as of 06/09/2017  1:19 PM    START taking these medications   Details  dicyclomine (BENTYL) 10 MG/5ML syrup Take 10 mLs (20 mg total) by mouth 3 (three) times daily as needed (abdominal pain)., Starting Thu 06/09/2017, Print    ondansetron (ZOFRAN ODT) 4 MG disintegrating tablet Take 1 tablet (4 mg total) by mouth every 8 (eight) hours as needed for nausea., Starting Thu 06/09/2017, Print         Larene Pickett, PA-C 06/09/17 1329    Long, Wonda Olds, MD 06/09/17 1501

## 2017-06-09 NOTE — Discharge Instructions (Signed)
Take the prescribed medication as directed. Follow-up with your primary care doctor. Recommend that you re-establish care with GI physician here locally. Return to the ED for new or worsening symptoms.

## 2017-06-09 NOTE — ED Triage Notes (Signed)
Pt complaint of ongoing generalized abd pain with associated n/v/d since Monday; denies GU symptoms.

## 2017-06-09 NOTE — ED Notes (Signed)
Patient was alert, oriented and stable upon discharge. RN went over AVS and patient had no further questions.  

## 2017-06-13 ENCOUNTER — Ambulatory Visit (INDEPENDENT_AMBULATORY_CARE_PROVIDER_SITE_OTHER): Payer: 59 | Admitting: Physician Assistant

## 2017-06-13 ENCOUNTER — Telehealth: Payer: Self-pay | Admitting: Gastroenterology

## 2017-06-13 ENCOUNTER — Encounter: Payer: Self-pay | Admitting: Physician Assistant

## 2017-06-13 ENCOUNTER — Other Ambulatory Visit (INDEPENDENT_AMBULATORY_CARE_PROVIDER_SITE_OTHER): Payer: 59

## 2017-06-13 VITALS — BP 100/74 | HR 80 | Ht 67.0 in | Wt 231.4 lb

## 2017-06-13 DIAGNOSIS — R110 Nausea: Secondary | ICD-10-CM | POA: Diagnosis not present

## 2017-06-13 DIAGNOSIS — K50018 Crohn's disease of small intestine with other complication: Secondary | ICD-10-CM

## 2017-06-13 DIAGNOSIS — R109 Unspecified abdominal pain: Secondary | ICD-10-CM

## 2017-06-13 DIAGNOSIS — R197 Diarrhea, unspecified: Secondary | ICD-10-CM

## 2017-06-13 LAB — SEDIMENTATION RATE: SED RATE: 52 mm/h — AB (ref 0–20)

## 2017-06-13 LAB — LIPASE: Lipase: 33 U/L (ref 11.0–59.0)

## 2017-06-13 MED ORDER — HYOSCYAMINE SULFATE SL 0.125 MG SL SUBL: SUBLINGUAL_TABLET | SUBLINGUAL | 0 refills | Status: DC

## 2017-06-13 NOTE — Patient Instructions (Addendum)
Please go to the basement level to have your labs drawn and stool studies. We have sent the following medications to your pharmacy for you to pick up at your convenience: [place]. 1. Levsin SL for cramping, abdominal pain.   You have been scheduled for a CT scan of the abdomen and pelvis at [facility] ([address]---this is in the same building as [facility]).   You are scheduled on Wed 06-15-2017 at 4:00 PM. You should arrive at 3:45 PM to your appointment time for registration. Please follow the written instructions below on the day of your exam:  WARNING: IF YOU ARE ALLERGIC TO IODINE/X-RAY DYE, PLEASE NOTIFY RADIOLOGY IMMEDIATELY AT [id number]! YOU WILL BE GIVEN A 13 HOUR PREMEDICATION PREP.  1) Do not eat  anything after 12:00 Noon (4 hours prior to your test) 2) You have been given 2 bottles of oral contrast to drink. The solution may taste               better if refrigerated, but do NOT add ice or any other liquid to this solution. Shake             well before drinking.    Drink 1 bottle of contrast @ 2:00 PM (2 hours prior to your exam)  Drink 1 bottle of contrast @ 3:00 PM (1 hour prior to your exam)  You may take any medications as prescribed with a small amount of water except for the following: Metformin, Glucophage, Glucovance, Avandamet, Riomet, Fortamet, Actoplus Met, Janumet, Glumetza or Metaglip. The above medications must be held the day of the exam AND 48 hours after the exam.  The purpose of you drinking the oral contrast is to aid in the visualization of your intestinal tract. The contrast solution may cause some diarrhea. Before your exam is started, you will be given a small amount of fluid to drink. Depending on your individual set of symptoms, you may also receive an intravenous injection of x-ray contrast/dye. Plan on being at [facility] for 30 minutes or long, depending on the type of exam you are having performed.  If you  have any questions regarding your exam or if you need to reschedule, you may call the CT department at [phone] between the hours of 8:00 am and 5:00 pm, Monday-Friday.  ________________________________________________________________________

## 2017-06-13 NOTE — Telephone Encounter (Signed)
Patient with a history of Crohn's disease.  She was evaluated in the ED over the weekend for abdomina pain.  She is a former patient of Dr. Sharlett Iles.  She will come in today and see Amy Esterwood PA at 2:30.  She has not been treated for her crohn's since 2014.

## 2017-06-13 NOTE — Progress Notes (Addendum)
Subjective:    Patient ID: Julie Ward, female    DOB: 11/10/1996, 21 y.o.   MRN: [id number]  HPI Julie Ward is a 21 year old African-American female, known remotely to Dr. Verl Ward who comes in today after recent ER visit on 06/08/2017 with complaints of abdominal pain. Patient had also been seen at [facility]/Dr. Nyoka Ward in 2015. She has history of Crohn's ileocolitis diagnosed on colonoscopy October 2014 per Dr. Sharlett Ward. She was noted to have cecal and terminal ileal inflammatory changes and biopsies were consistent with chronic active ileitis and chronic active ileocolitis. I do not believe she followed up in our office after diagnosis was made. She is also status post appendectomy, cholecystectomy and partial thyroidectomy. Most recent workup had been done with MR enterography in February 2016 per [facility] which was normal. She had EGD done at [facility] in 2015 normal and gastric emptying scan in 2015 abnormal at 2 hours with 82% retention and normal at 4 hours. Patient says she's not been on any medication over the past couple of years but had been treated with [facility] per [facility] he was left on this for about 2 years and feels that this caused problems with her thyroid. She says she has periodically felt like she has "flares" of Crohn's. She started having symptoms over the past week or so with upper abdominal pains which she describes as stabbing and cramping in nature and associated diarrhea with up to 7-8 bowel movements per day. She's not noted any melena or hematochezia. She says everything she eats goes straight through and her weight is down about 8 pounds. He has had some low-grade temps in the 99 range. She did not have any imaging done with recent ER visit on 06/08/2017 and also had been in the ER in June 2018. Labs are reviewed CBC was within normal limits, potassium 3.3. She was given a prescription for dicyclomine which she says she couldn't  tolerate and worsened her anxiety symptoms.  Review of Systems Pertinent positive and negative review of systems were noted in the above HPI section.  All other review of systems was otherwise negative.  Outpatient Encounter Prescriptions as of 06/13/2017  Medication Sig  . dicyclomine (BENTYL) 10 MG/5ML syrup Take 10 mLs (20 mg total) by mouth 3 (three) times daily as needed (abdominal pain). (Patient not taking: Reported on 06/13/2017)  . famotidine (PEPCID) 20 MG tablet Take 1 tablet (20 mg total) by mouth 2 (two) times daily. (Patient not taking: Reported on 06/09/2017)  . Hyoscyamine Sulfate SL (LEVSIN/SL) 0.125 MG SUBL Dissolve 1 tablet on the tongue every 6 hours as needed for cramping.  . ondansetron (ZOFRAN ODT) 4 MG disintegrating tablet Take 1 tablet (4 mg total) by mouth every 8 (eight) hours as needed for nausea. (Patient not taking: Reported on 06/13/2017)   No facility-administered encounter medications on file as of 06/13/2017.    Allergies  Allergen Reactions  . Fentanyl Other (See Comments)    anxiety  . Morphine And Related     Caused stomach to burn   . Oxycontin [Oxycodone Hcl] Nausea And Vomiting    vomiting  . Penicillins Nausea Only and Other (See Comments)    Made her sick Has patient had a PCN reaction causing immediate rash, facial/tongue/throat swelling, SOB or lightheadedness with hypotension:No Has patient had a PCN reaction causing severe rash involving mucus membranes or skin necrosis: No Has patient had a PCN reaction that required hospitalization: No Has patient had a  PCN reaction occurring within the last 10 years:No .  . Prednisone Hives  . Valium [Diazepam]     Hyperactivity    Patient Active Problem List   Diagnosis Date Noted  . Goiter, non-toxic 04/29/2015  . Vomiting 10/05/2014  . Hypokalemia 10/05/2014  . Elevated LFTs 10/05/2014  . Nausea and vomiting 10/04/2014  . Thyroid nodule 10/04/2014  . Abdominal pain 10/04/2014  . Low TSH level  07/18/2014  . Thyromegaly 06/06/2014  . Hives 03/08/2014  . Crohn's disease (Julie Ward) 03/01/2014  . Heme + stool 08/28/2013  . Abdominal pain, other specified site 08/28/2013  . Change in bowel habits 08/28/2013  . Postop check 03/03/2012  . Generalized abdominal pain   . PITYRIASIS ROSEA 09/11/2010  . GERD 07/23/2010  . DYSMENORRHEA 07/23/2010  . BOILS, RECURRENT 10/22/2008   Social History   Social History  . Marital status: Single    Spouse name: N/A  . Number of children: 0  . Years of education: N/A   Occupational History  . [employer]    Social History Main Topics  . Smoking status: Never Smoker  . Smokeless tobacco: Never Used  . Alcohol use 0.0 oz/week     Comment: occ  . Drug use: No  . Sexual activity: No   Other Topics Concern  . Not on file   Social History Narrative  . No narrative on file    Ms. Kimm's family history includes Cancer in her maternal grandmother, paternal aunt, and paternal uncle; Colon cancer in her paternal uncle; Heart disease in her maternal grandmother; Hyperlipidemia in her paternal grandmother; Hypertension in her maternal grandmother and paternal grandmother; Irritable bowel syndrome in her maternal grandmother.      Objective:    Vitals:   06/13/17 1426  BP: 100/74  Pulse: 80    Physical Exam   Well-developed young African-American female in no acute distress, blood pressure 100/74 pulse 80, height 5 foot 7, weight 231, BMI of 36.2. HEENT; nontraumatic, cephalic EOMI PERRLA sclerae anicteric, Cardiovascular; regular rate and rhythm with S1-S2 no murmur or gallop, Pulmonary ;clear bilaterally, Abdomen ;large soft she has some tenderness across the upper abdomen there is no guarding or rebound no palpable mass or hepatosplenomegaly, Rectal ;exam not done, Extremities ;no clubbing cyanosis or edema skin warm and dry, Neuropsych; mood and affect appropriate      Assessment & Plan:   #2 21 year old African-American female with  diagnosis of Crohn's ileocolitis made October 2014 and not seen in our office for follow-up since, who comes in today after ER visit on 06/08/2017 with 2 week history of upper abdominal cramping abdominal pain diarrhea and weight loss. She feels her symptoms are consistent with a Crohn's exacerbation. Exciting She's not had any recent abdominal imaging. Last imaging study done in February 2016 with MR enterography which was unremarkable. She certainly may be having an exacerbation of Crohn's, rule out other intra-abdominal inflammatory process, infectious etiologies #2 status post appendectomy #3 status post cholecystectomy #4 status post partial thyroidectomy #5 previously documented delayed gastric emptying 2015  Plan; check sedimentation rate, CRP, GI pathogen panel and stool for lactoferrin Schedule for CT of the abdomen and pelvis with contrast Full liquid to very soft bland diet Start Levsin sublingual one every 4-6 hours as needed for abdominal cramping Patient will be established with Dr. Carlean Purl, repeat colonoscopy may be indicated if CT is nondiagnostic. Calder Oblinger Genia Harold PA-C 06/13/2017  Agree with Ms. Genia Harold assessment and plan.  She had a CT via  ED 8/1 with mild ileitis and ? Transverse colon inflammation She needs different Tx for Crohn's probably a biologic - I will call her and discuss things soon  We can cancel stool studies and CT she has not done - will have this done Gatha Mayer, MD, [facility]   Cc: Laurey Morale, MD

## 2017-06-14 LAB — HCG, SERUM, QUALITATIVE: Preg, Serum: NEGATIVE

## 2017-06-15 ENCOUNTER — Inpatient Hospital Stay: Admission: RE | Admit: 2017-06-15 | Payer: 59 | Source: Ambulatory Visit

## 2017-06-15 ENCOUNTER — Emergency Department (HOSPITAL_COMMUNITY): Payer: 59

## 2017-06-15 ENCOUNTER — Emergency Department (HOSPITAL_COMMUNITY)
Admission: EM | Admit: 2017-06-15 | Discharge: 2017-06-15 | Disposition: A | Discharge status: Home / Self Care | Payer: 59 | Attending: Emergency Medicine | Admitting: Emergency Medicine

## 2017-06-15 DIAGNOSIS — R197 Diarrhea, unspecified: Secondary | ICD-10-CM | POA: Diagnosis not present

## 2017-06-15 DIAGNOSIS — R1013 Epigastric pain: Secondary | ICD-10-CM | POA: Diagnosis not present

## 2017-06-15 DIAGNOSIS — K5010 Crohn's disease of large intestine without complications: Secondary | ICD-10-CM | POA: Insufficient documentation

## 2017-06-15 DIAGNOSIS — R109 Unspecified abdominal pain: Secondary | ICD-10-CM | POA: Diagnosis not present

## 2017-06-15 DIAGNOSIS — K5090 Crohn's disease, unspecified, without complications: Secondary | ICD-10-CM | POA: Diagnosis not present

## 2017-06-15 LAB — COMPREHENSIVE METABOLIC PANEL
ALT: 14 U/L (ref 14–54)
AST: 19 U/L (ref 15–41)
Albumin: 4.1 g/dL (ref 3.5–5.0)
Alkaline Phosphatase: 72 U/L (ref 38–126)
Anion gap: 9 (ref 5–15)
BILIRUBIN TOTAL: 1.2 mg/dL (ref 0.3–1.2)
BUN: 7 mg/dL (ref 6–20)
CALCIUM: 9 mg/dL (ref 8.9–10.3)
CHLORIDE: 102 mmol/L (ref 101–111)
CO2: 28 mmol/L (ref 22–32)
CREATININE: 0.84 mg/dL (ref 0.44–1.00)
Glucose, Bld: 90 mg/dL (ref 65–99)
Potassium: 3.2 mmol/L — ABNORMAL LOW (ref 3.5–5.1)
Sodium: 139 mmol/L (ref 135–145)
TOTAL PROTEIN: 8.8 g/dL — AB (ref 6.5–8.1)

## 2017-06-15 LAB — I-STAT BETA HCG BLOOD, ED (MC, WL, AP ONLY)

## 2017-06-15 LAB — CBC
HCT: 38.5 % (ref 36.0–46.0)
Hemoglobin: 13.5 g/dL (ref 12.0–15.0)
MCH: 30.3 pg (ref 26.0–34.0)
MCHC: 35.1 g/dL (ref 30.0–36.0)
MCV: 86.3 fL (ref 78.0–100.0)
Platelets: 275 10*3/uL (ref 150–400)
RBC: 4.46 MIL/uL (ref 3.87–5.11)
RDW: 12.3 % (ref 11.5–15.5)
WBC: 4.1 10*3/uL (ref 4.0–10.5)

## 2017-06-15 LAB — LIPASE, BLOOD: LIPASE: 25 U/L (ref 11–51)

## 2017-06-15 MED ORDER — IOPAMIDOL (ISOVUE-300) INJECTION 61%
INTRAVENOUS | Status: AC
  Filled 2017-06-15: qty 100

## 2017-06-15 MED ORDER — FAMOTIDINE IN NACL 20-0.9 MG/50ML-% IV SOLN
20.0000 mg | Freq: Once | INTRAVENOUS | Status: AC
  Administered 2017-06-15: 20 mg via INTRAVENOUS
  Filled 2017-06-15: qty 50

## 2017-06-15 MED ORDER — HYDROMORPHONE HCL 1 MG/ML IJ SOLN
0.5000 mg | Freq: Once | INTRAMUSCULAR | Status: AC
  Administered 2017-06-15: 0.5 mg via INTRAVENOUS
  Filled 2017-06-15: qty 1

## 2017-06-15 MED ORDER — IOPAMIDOL (ISOVUE-300) INJECTION 61%
100.0000 mL | Freq: Once | INTRAVENOUS | Status: AC | PRN
  Administered 2017-06-15: 100 mL via INTRAVENOUS

## 2017-06-15 MED ORDER — SODIUM CHLORIDE 0.9 % IV BOLUS (SEPSIS)
1000.0000 mL | Freq: Once | INTRAVENOUS | Status: AC
  Administered 2017-06-15: 1000 mL via INTRAVENOUS

## 2017-06-15 MED ORDER — HYOSCYAMINE SULFATE SL 0.125 MG SL SUBL: 1.0000 | SUBLINGUAL_TABLET | SUBLINGUAL | 0 refills | Status: DC | PRN

## 2017-06-15 NOTE — ED Notes (Signed)
Pt family is requesting IV team or Ultrasound IV due to hx of many sticks and difficulty getting blood

## 2017-06-15 NOTE — Discharge Instructions (Signed)
Please read attached information regarding your condition. Take Levsin under tongue every 4 hours as needed. Collect stool sample as previous directed. Follow up with [facility] gastroenterology for further evaluation by the end of the week. Return to ED for worsening pain, blood in stool, loss of consciousness, increased vomiting.

## 2017-06-15 NOTE — ED Notes (Signed)
Pt ambulatory and independent at discharge.  Verbalized understanding of discharge instructions 

## 2017-06-15 NOTE — ED Provider Notes (Signed)
[facility] DEPT Provider Note   CSN: [id number] Arrival date & time: 06/15/17  1039     History   Chief Complaint Chief Complaint  Patient presents with  . Abdominal Pain    HPI Julie Ward is a 21 y.o. female.  HPI  Patient, with a past medical history of Crohn's disease, presents to ED for one week history of abdominal pain and multiple episodes of diarrhea per day. She denies any blood in stool, nausea, vomiting. She reports no improvement in her symptoms since being here last week for her Crohn's flare. She states that the Bentyl and by mouth Pepcid given to her have made her feel even more sick. He also reports recent 11 pound weight loss. States that she was recently established care with [facility]. Was scheduled for CT today but states that she does not want to take the by mouth contrast offered by the clinic. Does not had a colonoscopy done in several years.  Past Medical History:  Diagnosis Date  . Abdominal pain, recurrent   . Allergy   . Anxiety   . Appendicitis   . Crohn's disease of colon ([place]) 10/14   sees Dr. Blanca Friend at [facility]   . Dysmenorrhea   . GERD (gastroesophageal reflux disease)    not taking any medication  . Shortness of breath dyspnea    ith thyroid problem    Patient Active Problem List   Diagnosis Date Noted  . Goiter, non-toxic 04/29/2015  . Vomiting 10/05/2014  . Hypokalemia 10/05/2014  . Elevated LFTs 10/05/2014  . Nausea and vomiting 10/04/2014  . Thyroid nodule 10/04/2014  . Abdominal pain 10/04/2014  . Low TSH level 07/18/2014  . Thyromegaly 06/06/2014  . Hives 03/08/2014  . Crohn's disease ([place]) 03/01/2014  . Heme + stool 08/28/2013  . Abdominal pain, other specified site 08/28/2013  . Change in bowel habits 08/28/2013  . Postop check 03/03/2012  . Generalized abdominal pain   . PITYRIASIS [place] 09/11/2010  . GERD 07/23/2010  . DYSMENORRHEA 07/23/2010  . BOILS, RECURRENT 10/22/2008    Past Surgical  History:  Procedure Laterality Date  . APPENDECTOMY  07-14-11   laparoscopic   . CHOLECYSTECTOMY  02/11/2012   Procedure: LAPAROSCOPIC CHOLECYSTECTOMY WITH INTRAOPERATIVE CHOLANGIOGRAM;  Surgeon: Gwenyth Ober, MD;  Location: [facility];  Service: General;  Laterality: N/A;  . ESOPHAGOGASTRODUODENOSCOPY  01/28/2012   Procedure: ESOPHAGOGASTRODUODENOSCOPY (EGD);  Surgeon: Oletha Blend, MD;  Location: [facility];  Service: Gastroenterology;  Laterality: N/A;  . THYROID LOBECTOMY Left 04/29/2015   Procedure: LEFT TOTAL THYROID LOBECTOMY;  Surgeon: Judeth Horn, MD;  Location: [facility];  Service: General;  Laterality: Left;  . WISDOM TOOTH EXTRACTION      OB History    Gravida Para Term Preterm AB Living   0             SAB TAB Ectopic Multiple Live Births                   Home Medications    Prior to Admission medications   Medication Sig Start Date End Date Taking? Authorizing Provider  dicyclomine (BENTYL) 10 MG/5ML syrup Take 10 mLs (20 mg total) by mouth 3 (three) times daily as needed (abdominal pain). Patient not taking: Reported on 06/13/2017 06/09/17   Larene Pickett, PA-C  famotidine (PEPCID) 20 MG tablet Take 1 tablet (20 mg total) by mouth 2 (two) times daily. Patient not taking: Reported on 06/09/2017 05/06/17  Charlesetta Shanks, MD  Hyoscyamine Sulfate SL (LEVSIN/SL) 0.125 MG SUBL Place 1 tablet under the tongue every 4 (four) hours as needed. 06/15/17   Tavish Gettis, PA-C  ondansetron (ZOFRAN ODT) 4 MG disintegrating tablet Take 1 tablet (4 mg total) by mouth every 8 (eight) hours as needed for nausea. Patient not taking: Reported on 06/13/2017 06/09/17   Larene Pickett, PA-C    Family History Family History  Problem Relation Age of Onset  . Cancer Paternal Aunt        ovarian, kidney  . Cancer Paternal Uncle        colon  . Colon cancer Paternal Uncle   . Cancer Maternal Grandmother        breast   . Hypertension Maternal Grandmother   . Irritable bowel syndrome Maternal  Grandmother   . Heart disease Maternal Grandmother   . Hypertension Paternal Grandmother   . Hyperlipidemia Paternal Grandmother     Social History Social History  Substance Use Topics  . Smoking status: Never Smoker  . Smokeless tobacco: Never Used  . Alcohol use 0.0 oz/week     Comment: occ     Allergies   Fentanyl; Morphine and related; Oxycontin [oxycodone hcl]; Penicillins; Prednisone; and Valium [diazepam]   Review of Systems Review of Systems  Constitutional: Positive for unexpected weight change. Negative for appetite change, chills and fever.  HENT: Negative for ear pain, rhinorrhea, sneezing and sore throat.   Eyes: Negative for photophobia and visual disturbance.  Respiratory: Negative for cough, chest tightness, shortness of breath and wheezing.   Cardiovascular: Negative for chest pain and palpitations.  Gastrointestinal: Positive for abdominal pain and diarrhea. Negative for blood in stool, constipation, nausea and vomiting.  Genitourinary: Negative for dysuria, hematuria and urgency.  Musculoskeletal: Negative for myalgias.  Skin: Negative for rash.  Neurological: Negative for dizziness, weakness and light-headedness.     Physical Exam Updated Vital Signs BP (!) 142/72 (BP Location: Right Arm)   Pulse 82   Temp 98.3 F (36.8 C) (Oral)   Resp 18   LMP 06/09/2017   SpO2 100%   Physical Exam  Constitutional: She appears well-developed and well-nourished. No distress.  HENT:  Head: Normocephalic and atraumatic.  Nose: Nose normal.  Eyes: Conjunctivae and EOM are normal. Left eye exhibits no discharge. No scleral icterus.  Neck: Normal range of motion. Neck supple.  Cardiovascular: Normal rate, regular rhythm, normal heart sounds and intact distal pulses.  Exam reveals no gallop and no friction rub.   No murmur heard. Pulmonary/Chest: Effort normal and breath sounds normal. No respiratory distress.  Abdominal: Soft. Bowel sounds are normal. She  exhibits no distension. There is tenderness (Epigastric). There is no rebound and no guarding.  Musculoskeletal: Normal range of motion. She exhibits no edema.  Neurological: She is alert. She exhibits normal muscle tone. Coordination normal.  Skin: Skin is warm and dry. No rash noted.  Psychiatric: She has a normal mood and affect.  Nursing note and vitals reviewed.    ED Treatments / Results  Labs (all labs ordered are listed, but only abnormal results are displayed) Labs Reviewed  COMPREHENSIVE METABOLIC PANEL - Abnormal; Notable for the following:       Result Value   Potassium 3.2 (*)    Total Protein 8.8 (*)    All other components within normal limits  GASTROINTESTINAL PANEL BY PCR, STOOL (REPLACES STOOL CULTURE)  LIPASE, BLOOD  CBC  URINALYSIS, ROUTINE W REFLEX MICROSCOPIC  I-STAT BETA HCG  BLOOD, ED ([facility], [facility], [facility] ONLY)    EKG  EKG Interpretation None       Radiology Ct Abdomen Pelvis W Contrast  Result Date: 06/15/2017 CLINICAL DATA:  21 year old female with a history of abdominal pain and history of Crohn's disease EXAM: CT ABDOMEN AND PELVIS WITH CONTRAST TECHNIQUE: Multidetector CT imaging of the abdomen and pelvis was performed using the standard protocol following bolus administration of intravenous contrast. CONTRAST:  132m ISOVUE-300 IOPAMIDOL (ISOVUE-300) INJECTION 61% COMPARISON:  04/06/2014, 11/07/2013, 08/30/2013, 06/28/2013 FINDINGS: Lower chest: No acute abnormality. Hepatobiliary: Unremarkable appearance of the liver. Surgical changes of cholecystectomy. Trace dilation of the left-sided hepatic ducts, unchanged from the comparison. No radiopaque choledocholithiasis. Pancreas: Unremarkable appearance of the pancreas Spleen: Unremarkable spleen Adrenals/Urinary Tract: Unremarkable adrenal glands. Unremarkable appearance of the kidneys with early excretion of contrast. Unremarkable appearance of the urinary bladder Stomach/Bowel: Unremarkable appearance of the  stomach. Small bowel is decompressed. No significant inflammatory changes within the mesenteric. No significant increased vasculature of the mesenteric. There is questionable enhancement at the terminal ileum extending to the ileocecal valve (image number 38 at series 2). No focal fluid or focal gas. Surgical changes of appendectomy. Relatively unremarkable appearance of the colon. There is questionable enhancement of the mucosa at the transverse colon, with relative decompression. No evidence of obstruction. Vascular/Lymphatic: Unremarkable appearance of the vasculature Reproductive: Small amount of fluid within the endometrial canal. Unremarkable appearance of the adnexa. Other: No abdominal wall hernia. Musculoskeletal: No displaced fracture. No significant degenerative changes. IMPRESSION: There is questionable mucosal enhancement at the terminal ileum just above the ileocecal valve, as well as questionable enhancement of the mucosa of the transverse colon. No significant inflammatory changes of the mesenteric or significant increased vasculature of the small bowel or colon. Physiologic changes of the uterus and adnexa Electronically Signed   By: JCorrie MckusickD.O.   On: 06/15/2017 14:23    Procedures Procedures (including critical care time)  Medications Ordered in ED Medications  sodium chloride 0.9 % bolus 1,000 mL (0 mLs Intravenous Stopped 06/15/17 1454)  famotidine (PEPCID) IVPB 20 mg premix (0 mg Intravenous Stopped 06/15/17 1348)  HYDROmorphone (DILAUDID) injection 0.5 mg (0.5 mg Intravenous Given 06/15/17 1355)  iopamidol (ISOVUE-300) 61 % injection 100 mL ( Intravenous Hold 06/15/17 1414)     Initial Impression / Assessment and Plan / ED Course  I have reviewed the triage vital signs and the nursing notes.  Pertinent labs & imaging results that were available during my care of the patient were reviewed by me and considered in my medical decision making (see chart for details).      Patient, with a past medical history of Crohn's disease, presents to ED for one week history of abdominal pain and multiple episodes of diarrhea per day. States that symptoms are consistent with previous Crohn's flares she has not had one in the past year. She is not on any chronic medications for her Crohn's disease. She recently established care with [facility] and was scheduled for CT scan today as well as stool cultures. She continues to have diarrhea despite the use of Bentyl and Pepcid. She states that the Benadryl makes her anxious and jittery and she does not want to take anymore. She states that the by mouth Pepcid also gives her worsening symptoms. Physical exam she is tender to palpation in the epigastric region. She is afebrile with no history of fever. Pain control here in the ED with Dilaudid, Pepcid and fluids. CBC, CMP unremarkable.  Lipase unremarkable. HCG negative. She denies any urinary symptoms.CT scan was done and showed findings consistent with Crohn's flareup. There are no other findings present. Spoke to [facility] who recommends to start patient on Levsin SL up to 4 times daily as needed. Patient encouraged to collect stool for stool culture and to follow up with GI specialist within this week if she is not contacted first. Patient agrees to this plan and states that she is willing to try the new medication and follow-up plans diet as directed. Patient appears stable for discharge at this time. Strict return precautions given.  Final Clinical Impressions(s) / ED Diagnoses   Final diagnoses:  Diarrhea, unspecified type  Crohn's disease of colon without complication [facility])    New Prescriptions Discharge Medication List as of 06/15/2017  2:48 PM       Delia Heady, PA-C 06/15/17 Gaines, [place], DO 06/16/17 [phone]

## 2017-06-15 NOTE — ED Notes (Signed)
Patient transported to CT 

## 2017-06-15 NOTE — ED Triage Notes (Signed)
Pt states that she has a hx of Crohn's disease and has had abd pain, N/V/D x 1 week. Alert and oriented

## 2017-06-23 ENCOUNTER — Ambulatory Visit: Payer: 59 | Admitting: Registered"

## 2017-06-27 ENCOUNTER — Telehealth: Payer: Self-pay

## 2017-06-27 NOTE — Telephone Encounter (Signed)
I spoke with Ubaldo Glassing and told her she doesn't have to do the stool studies or the CT we ordered since she went to the ED and got CT done. I told her Dr Carlean Purl will be in touch.  I also wished her a Happy Belated Birthday from [place].

## 2017-06-27 NOTE — Telephone Encounter (Signed)
-----   Message from Gatha Mayer, MD sent at 06/25/2017  5:35 PM EDT ----- Regarding: cancel Seen by Ashley Royalty to ED and had CT there  Please cancel CT we ordered and stool studies Tell patient I will call her soon to review CT and discuss Tx for Crohn's

## 2017-07-22 ENCOUNTER — Telehealth: Payer: Self-pay | Admitting: Internal Medicine

## 2017-07-22 NOTE — Telephone Encounter (Signed)
Caught up with her about CT abd/pelvis results 7/30  - apologized for delay  She has been feeling ok  Explained that she has Crohn's and should have chronic Tx  She says she stopped Entocort and other meds in the past because it made her worse and also MD told her that thyroiditis she had surgery for was from the [place]  I asked her to set up a f/u visit w/ me  She said she would call and set one up - has to coordinate with work schedule

## 2017-07-25 ENCOUNTER — Ambulatory Visit: Payer: 59 | Admitting: Registered"

## 2017-08-04 ENCOUNTER — Encounter: Payer: Self-pay | Admitting: Family Medicine

## 2018-04-05 ENCOUNTER — Ambulatory Visit (INDEPENDENT_AMBULATORY_CARE_PROVIDER_SITE_OTHER): Payer: 59 | Admitting: Family Medicine

## 2018-04-05 ENCOUNTER — Encounter: Payer: Self-pay | Admitting: Family Medicine

## 2018-04-05 VITALS — BP 118/70 | HR 60 | Temp 98.5°F | Ht 67.0 in | Wt 243.0 lb

## 2018-04-05 DIAGNOSIS — Z3009 Encounter for other general counseling and advice on contraception: Secondary | ICD-10-CM

## 2018-04-05 NOTE — Progress Notes (Signed)
   Subjective:    Patient ID: Julie Ward, female    DOB: Aug 08, 1996, 22 y.o.   MRN: [id number]  HPI Here asking advice about artificial insemination. She has decided that she wants to become pregnant but without a partner. She does not know who to see about this, although she has researched a group in [place] about this.    Review of Systems  Constitutional: Negative.   Respiratory: Negative.   Cardiovascular: Negative.        Objective:   Physical Exam  Constitutional: She appears well-developed and well-nourished.  Cardiovascular: Normal rate, regular rhythm, normal heart sounds and intact distal pulses.  Pulmonary/Chest: Effort normal and breath sounds normal.          Assessment & Plan:  Desire to become pregnant. We will get more information about what providers in [place] offer this.  Alysia Penna, MD

## 2018-05-12 ENCOUNTER — Ambulatory Visit (INDEPENDENT_AMBULATORY_CARE_PROVIDER_SITE_OTHER): Payer: 59 | Admitting: Family Medicine

## 2018-05-12 ENCOUNTER — Encounter: Payer: Self-pay | Admitting: Family Medicine

## 2018-05-12 VITALS — BP 118/68 | HR 63 | Temp 98.7°F | Ht 67.0 in | Wt 241.0 lb

## 2018-05-12 DIAGNOSIS — K5010 Crohn's disease of large intestine without complications: Secondary | ICD-10-CM

## 2018-05-12 NOTE — Progress Notes (Signed)
   Subjective:    Patient ID: Julie Ward, female    DOB: 11-11-1996, 22 y.o.   MRN: [id number]  HPI Here asking for help with forms for work. She has Crohns disease which causes her to have bouts of cramps and diarrhea. This forces here to miss some work. Her employer has given her some ADA forms for [place] to document this condition. This has been a good week for her and she feels well.   Review of Systems  Constitutional: Negative.   Respiratory: Negative.   Cardiovascular: Negative.   Gastrointestinal: Negative.        Objective:   Physical Exam  Constitutional: She is oriented to person, place, and time. She appears well-developed and well-nourished.  Cardiovascular: Normal rate, regular rhythm, normal heart sounds and intact distal pulses.  Pulmonary/Chest: Effort normal and breath sounds normal.  Abdominal: Soft. Bowel sounds are normal. She exhibits no distension and no mass. There is no tenderness. There is no rebound and no guarding.  Neurological: She is alert and oriented to person, place, and time.          Assessment & Plan:  Crohns disease. We filled out the forms to allow her to miss work for up to 2 episodes a month and up to 3 days per episode. Alysia Penna, MD

## 2018-08-01 ENCOUNTER — Emergency Department (HOSPITAL_COMMUNITY)
Admission: EM | Admit: 2018-08-01 | Discharge: 2018-08-01 | Disposition: A | Discharge status: Home / Self Care | Payer: 59 | Attending: Emergency Medicine | Admitting: Emergency Medicine

## 2018-08-01 ENCOUNTER — Encounter (HOSPITAL_COMMUNITY): Payer: Self-pay | Admitting: Emergency Medicine

## 2018-08-01 ENCOUNTER — Emergency Department (HOSPITAL_COMMUNITY): Payer: 59

## 2018-08-01 DIAGNOSIS — R1110 Vomiting, unspecified: Secondary | ICD-10-CM | POA: Diagnosis not present

## 2018-08-01 DIAGNOSIS — R112 Nausea with vomiting, unspecified: Secondary | ICD-10-CM | POA: Insufficient documentation

## 2018-08-01 DIAGNOSIS — Z79899 Other long term (current) drug therapy: Secondary | ICD-10-CM | POA: Insufficient documentation

## 2018-08-01 DIAGNOSIS — R197 Diarrhea, unspecified: Secondary | ICD-10-CM | POA: Insufficient documentation

## 2018-08-01 DIAGNOSIS — N83209 Unspecified ovarian cyst, unspecified side: Secondary | ICD-10-CM

## 2018-08-01 DIAGNOSIS — R1084 Generalized abdominal pain: Secondary | ICD-10-CM | POA: Diagnosis not present

## 2018-08-01 DIAGNOSIS — N83299 Other ovarian cyst, unspecified side: Secondary | ICD-10-CM | POA: Insufficient documentation

## 2018-08-01 LAB — CBC
HCT: 37.2 % (ref 36.0–46.0)
HEMOGLOBIN: 12.4 g/dL (ref 12.0–15.0)
MCH: 29.1 pg (ref 26.0–34.0)
MCHC: 33.3 g/dL (ref 30.0–36.0)
MCV: 87.3 fL (ref 78.0–100.0)
Platelets: 293 10*3/uL (ref 150–400)
RBC: 4.26 MIL/uL (ref 3.87–5.11)
RDW: 12.4 % (ref 11.5–15.5)
WBC: 4.1 10*3/uL (ref 4.0–10.5)

## 2018-08-01 LAB — URINALYSIS, ROUTINE W REFLEX MICROSCOPIC
Bilirubin Urine: NEGATIVE
GLUCOSE, UA: NEGATIVE mg/dL
KETONES UR: NEGATIVE mg/dL
Nitrite: NEGATIVE
PROTEIN: 30 mg/dL — AB
Specific Gravity, Urine: 1.025 (ref 1.005–1.030)
pH: 6 (ref 5.0–8.0)

## 2018-08-01 LAB — COMPREHENSIVE METABOLIC PANEL
ALBUMIN: 3.7 g/dL (ref 3.5–5.0)
ALK PHOS: 63 U/L (ref 38–126)
ALT: 10 U/L (ref 0–44)
AST: 13 U/L — ABNORMAL LOW (ref 15–41)
Anion gap: 9 (ref 5–15)
BUN: 8 mg/dL (ref 6–20)
CALCIUM: 9.1 mg/dL (ref 8.9–10.3)
CHLORIDE: 106 mmol/L (ref 98–111)
CO2: 27 mmol/L (ref 22–32)
Creatinine, Ser: 0.74 mg/dL (ref 0.44–1.00)
GFR calc Af Amer: >60 mL/min (ref >60)
GFR calc non Af Amer: >60 mL/min (ref >60)
GLUCOSE: 99 mg/dL (ref 70–99)
Potassium: 3.2 mmol/L — ABNORMAL LOW (ref 3.5–5.1)
SODIUM: 142 mmol/L (ref 135–145)
Total Bilirubin: 0.8 mg/dL (ref 0.3–1.2)
Total Protein: 7.9 g/dL (ref 6.5–8.1)

## 2018-08-01 LAB — LIPASE, BLOOD: LIPASE: 31 U/L (ref 11–51)

## 2018-08-01 LAB — I-STAT BETA HCG BLOOD, ED (MC, WL, AP ONLY)

## 2018-08-01 MED ORDER — IOPAMIDOL (ISOVUE-300) INJECTION 61%
100.0000 mL | Freq: Once | INTRAVENOUS | Status: AC | PRN
  Administered 2018-08-01: 100 mL via INTRAVENOUS

## 2018-08-01 MED ORDER — PROMETHAZINE HCL 25 MG/ML IJ SOLN
12.5000 mg | Freq: Once | INTRAMUSCULAR | Status: DC
  Filled 2018-08-01: qty 1

## 2018-08-01 MED ORDER — ONDANSETRON HCL 4 MG/2ML IJ SOLN
4.0000 mg | Freq: Once | INTRAMUSCULAR | Status: AC
  Administered 2018-08-01: 4 mg via INTRAVENOUS
  Filled 2018-08-01: qty 2

## 2018-08-01 MED ORDER — PROMETHAZINE HCL 25 MG PO TABS
25.0000 mg | ORAL_TABLET | Freq: Once | ORAL | Status: AC
  Administered 2018-08-01: 25 mg via ORAL
  Filled 2018-08-01: qty 1

## 2018-08-01 MED ORDER — SODIUM CHLORIDE 0.9 % IV BOLUS
1000.0000 mL | Freq: Once | INTRAVENOUS | Status: AC
Start: 2018-08-01 — End: 2018-08-01
  Administered 2018-08-01: 1000 mL via INTRAVENOUS

## 2018-08-01 MED ORDER — FAMOTIDINE IN NACL 20-0.9 MG/50ML-% IV SOLN
20.0000 mg | Freq: Once | INTRAVENOUS | Status: AC
  Administered 2018-08-01: 20 mg via INTRAVENOUS
  Filled 2018-08-01: qty 50

## 2018-08-01 MED ORDER — HYDROMORPHONE HCL 1 MG/ML IJ SOLN
0.5000 mg | Freq: Once | INTRAMUSCULAR | Status: AC
  Administered 2018-08-01: 0.5 mg via INTRAVENOUS
  Filled 2018-08-01: qty 1

## 2018-08-01 MED ORDER — HYDROMORPHONE HCL 1 MG/ML IJ SOLN
0.2500 mg | Freq: Once | INTRAMUSCULAR | Status: AC
Start: 2018-08-01 — End: 2018-08-01
  Administered 2018-08-01: 0.25 mg via INTRAVENOUS
  Filled 2018-08-01: qty 1

## 2018-08-01 MED ORDER — IOPAMIDOL (ISOVUE-300) INJECTION 61%
INTRAVENOUS | Status: AC
  Filled 2018-08-01: qty 100

## 2018-08-01 MED ORDER — POTASSIUM CHLORIDE CRYS ER 20 MEQ PO TBCR: 20.0000 meq | EXTENDED_RELEASE_TABLET | Freq: Every day | ORAL | 0 refills | Status: DC

## 2018-08-01 MED ORDER — IOPAMIDOL (ISOVUE-300) INJECTION 61%
30.0000 mL | Freq: Once | INTRAVENOUS | Status: DC | PRN
  Administered 2018-08-01: 30 mL via ORAL
  Filled 2018-08-01: qty 30

## 2018-08-01 MED ORDER — PROMETHAZINE HCL 25 MG PO TABS: 25.0000 mg | ORAL_TABLET | Freq: Four times a day (QID) | ORAL | 0 refills | Status: DC | PRN

## 2018-08-01 NOTE — Discharge Instructions (Addendum)
You were seen in the emergency department today for abdominal pain, nausea, vomiting, and diarrhea.  Your lab work showed that your potassium is somewhat low at 3.2 (normal is 3.5-5.1), we are sending you home with a potassium supplement for the next few days, given you information for potassium rich food to include in your diet.  Your urine had some bacteria, blood, and protein in it, we have sent this for culture, these are all things need to be rechecked by a primary care provider within 1 week to ensure resolution.  Your CT scan did not show a crohns flare or problems with your small or large bowel.  It did show findings consistent with a recently ruptured ovarian cyst.  It is possible that you have multiple problems contributing to your symptoms including a viral GI illness and ruptured ovarian cyst.   We are sending home with a prescription for the potassium tablets as well as a prescription for Phenergan to help with nausea.  You may take the Phenergan every 6 hours as needed.  We have prescribed you new medication(s) today. Discuss the medications prescribed today with your pharmacist as they can have adverse effects and interactions with your other medicines including over the counter and prescribed medications. Seek medical evaluation if you start to experience new or abnormal symptoms after taking one of these medicines, seek care immediately if you start to experience difficulty breathing, feeling of your throat closing, facial swelling, or rash as these could be indications of a more serious allergic reaction  We would like you to follow-up with both a GI specialist and the women's health specialist, we have given you information for each of these in your discharge instructions, please call to make an appointment within the next 3 to 5 days.  Return to the ER for new or worsening symptoms including but not limited to, worsening pain, inability to keep fluids down, fever, burning with urination,  blood in your stool, mucus in your stool, or any other concerns.

## 2018-08-01 NOTE — ED Triage Notes (Signed)
Pt repots has chron's and for week and half had abd pains with n/v/d.

## 2018-08-01 NOTE — ED Provider Notes (Signed)
[facility] DEPT Provider Note   CSN: [id number] Arrival date & time: 08/01/18  0930     History   Chief Complaint Chief Complaint  Patient presents with  . Abdominal Pain  . Emesis  . Diarrhea    HPI Julie Ward is a 22 y.o. female with a hx of Crohn's disease, GERD, s/p cholecystectomy and appendectomy who presents to the ED with complaints of abdominal pain with associated N/V/D x 1.5 weeks. Patient reports generalized abdominal discomfort that is crampy/achy/sharp in nature, currently 10 out of 10 in severity, has been progressively worsening since onset.  She had associated nonbloody diarrhea (5-10 episodes daily) as well as nausea with 2-3 episodes of nonbloody emesis.  No specific alleviating or aggravating factors to her symptoms.  She has tried Tylenol without relief.  She has a history of Crohn's disease, she most recently saw [facility] about 1 year ago, she is not currently receiving any medications for treatment of Crohn's as she states that she has had poor reactions to all therapies tried.  She states that her current presentation feels more uncomfortable than prior Crohn's flares.  Denies fever, chills, dysuria, frequency, urgency, vaginal discharge, or current vaginal bleeding.  LMP ended approximately 2 days ago. Currently sexually active with one female partner, no concerns for STDs. Denies recent foreign travel or antibiotic use.  Patient with multiple allergies, as far as prior success and pain control she states that Dilaudid has worked best, however she does not like how sleepy this makes her feel, she is asking that we give her the smallest possible dose of this is the medicine if we are planning to administer it.   HPI  Past Medical History:  Diagnosis Date  . Abdominal pain, recurrent   . Allergy   . Anxiety   . Appendicitis   . Crohn's disease of colon ([place]) 10/14   sees Dr. Blanca Friend at [place]   . Dysmenorrhea   .  GERD (gastroesophageal reflux disease)    not taking any medication  . Shortness of breath dyspnea    ith thyroid problem    Patient Active Problem List   Diagnosis Date Noted  . Goiter, non-toxic 04/29/2015  . Vomiting 10/05/2014  . Hypokalemia 10/05/2014  . Elevated LFTs 10/05/2014  . Nausea and vomiting 10/04/2014  . Thyroid nodule 10/04/2014  . Abdominal pain 10/04/2014  . Low TSH level 07/18/2014  . Thyromegaly 06/06/2014  . Hives 03/08/2014  . Crohn's disease ([place]) 03/01/2014  . Heme + stool 08/28/2013  . Abdominal pain, other specified site 08/28/2013  . Change in bowel habits 08/28/2013  . Postop check 03/03/2012  . Generalized abdominal pain   . PITYRIASIS [place] 09/11/2010  . GERD 07/23/2010  . DYSMENORRHEA 07/23/2010  . BOILS, RECURRENT 10/22/2008    Past Surgical History:  Procedure Laterality Date  . APPENDECTOMY  07-14-11   laparoscopic   . CHOLECYSTECTOMY  02/11/2012   Procedure: LAPAROSCOPIC CHOLECYSTECTOMY WITH INTRAOPERATIVE CHOLANGIOGRAM;  Surgeon: Gwenyth Ober, MD;  Location: [facility];  Service: General;  Laterality: N/A;  . ESOPHAGOGASTRODUODENOSCOPY  01/28/2012   Procedure: ESOPHAGOGASTRODUODENOSCOPY (EGD);  Surgeon: Oletha Blend, MD;  Location: [facility];  Service: Gastroenterology;  Laterality: N/A;  . THYROID LOBECTOMY Left 04/29/2015   Procedure: LEFT TOTAL THYROID LOBECTOMY;  Surgeon: Judeth Horn, MD;  Location: [place];  Service: General;  Laterality: Left;  . [place] EXTRACTION       OB History    [place]  0   Para      Term      Preterm      AB      Living        SAB      TAB      Ectopic      Multiple      Live Births               Home Medications    Prior to Admission medications   Medication Sig Start Date End Date Taking? Authorizing Provider  dicyclomine (BENTYL) 10 MG/5ML syrup Take 10 mLs (20 mg total) by mouth 3 (three) times daily as needed (abdominal pain). 06/09/17   Larene Pickett, PA-C  famotidine  (PEPCID) 20 MG tablet Take 1 tablet (20 mg total) by mouth 2 (two) times daily. 05/06/17   Charlesetta Shanks, MD  Hyoscyamine Sulfate SL (LEVSIN/SL) 0.125 MG SUBL Place 1 tablet under the tongue every 4 (four) hours as needed. 06/15/17   Khatri, Hina, PA-C  ondansetron (ZOFRAN ODT) 4 MG disintegrating tablet Take 1 tablet (4 mg total) by mouth every 8 (eight) hours as needed for nausea. 06/09/17   Larene Pickett, PA-C  metoCLOPramide (REGLAN) 10 MG tablet Take 1 tablet (10 mg total) by mouth every 6 (six) hours. 01/19/12 01/20/12  Barbara Cower, MD    Family History Family History  Problem Relation Age of Onset  . Cancer Paternal Aunt        ovarian, kidney  . Cancer Paternal Uncle        colon  . Colon cancer Paternal Uncle   . Cancer Maternal Grandmother        breast   . Hypertension Maternal Grandmother   . Irritable bowel syndrome Maternal Grandmother   . Heart disease Maternal Grandmother   . Hypertension Paternal Grandmother   . Hyperlipidemia Paternal Grandmother     Social History Social History   Tobacco Use  . Smoking status: Never Smoker  . Smokeless tobacco: Never Used  Substance Use Topics  . Alcohol use: Yes    Alcohol/week: 0.0 standard drinks    Comment: occ  . Drug use: No     Allergies   Fentanyl; Morphine and related; Oxycontin [oxycodone hcl]; Penicillins; Prednisone; and Valium [diazepam]   Review of Systems Review of Systems  Constitutional: Negative for chills and fever.  Respiratory: Negative for shortness of breath.   Cardiovascular: Negative for chest pain.  Gastrointestinal: Positive for abdominal pain, diarrhea, nausea and vomiting. Negative for blood in stool and constipation.  Genitourinary: Negative for dysuria, vaginal bleeding and vaginal discharge.  All other systems reviewed and are negative.  Physical Exam Updated Vital Signs BP 122/79 (BP Location: Left Arm)   Pulse 83   Temp 98.3 F (36.8 C) (Oral)   Resp 18   SpO2 100%    Physical Exam  Constitutional: She appears well-developed and well-nourished.  Non-toxic appearance. No distress.  HENT:  Head: Normocephalic and atraumatic.  Eyes: Conjunctivae are normal. Right eye exhibits no discharge. Left eye exhibits no discharge.  Neck: Neck supple.  Cardiovascular: Normal rate and regular rhythm.  Pulmonary/Chest: Effort normal and breath sounds normal. No respiratory distress. She has no wheezes. She has no rhonchi. She has no rales.  Respiration even and unlabored  Abdominal: Soft. She exhibits no distension. There is generalized tenderness (to light and deep palpation, non focal). There is no rigidity, no rebound and no guarding.  Neurological: She is alert.  Clear  speech.   Skin: Skin is warm and dry. No rash noted.  Psychiatric: She has a normal mood and affect. Her behavior is normal.  Nursing note and vitals reviewed.  ED Treatments / Results  Labs Results for orders placed or performed during the hospital encounter of 08/01/18  Lipase, blood  Result Value Ref Range   Lipase 31 11 - 51 U/L  Comprehensive metabolic panel  Result Value Ref Range   Sodium 142 135 - 145 mmol/L   Potassium 3.2 (L) 3.5 - 5.1 mmol/L   Chloride 106 98 - 111 mmol/L   CO2 27 22 - 32 mmol/L   Glucose, Bld 99 70 - 99 mg/dL   BUN 8 6 - 20 mg/dL   Creatinine, Ser 0.74 0.44 - 1.00 mg/dL   Calcium 9.1 8.9 - 10.3 mg/dL   Total Protein 7.9 6.5 - 8.1 g/dL   Albumin 3.7 3.5 - 5.0 g/dL   AST 13 (L) 15 - 41 U/L   ALT 10 0 - 44 U/L   Alkaline Phosphatase 63 38 - 126 U/L   Total Bilirubin 0.8 0.3 - 1.2 mg/dL   GFR calc non Af Amer >60 >60 mL/min   GFR calc Af Amer >60 >60 mL/min   Anion gap 9 5 - 15  CBC  Result Value Ref Range   WBC 4.1 4.0 - 10.5 K/uL   RBC 4.26 3.87 - 5.11 MIL/uL   Hemoglobin 12.4 12.0 - 15.0 g/dL   HCT 37.2 36.0 - 46.0 %   MCV 87.3 78.0 - 100.0 fL   MCH 29.1 26.0 - 34.0 pg   MCHC 33.3 30.0 - 36.0 g/dL   RDW 12.4 11.5 - 15.5 %   Platelets 293 150 -  400 K/uL  Urinalysis, Routine w reflex microscopic  Result Value Ref Range   Color, Urine YELLOW YELLOW   APPearance HAZY (A) CLEAR   Specific Gravity, Urine 1.025 1.005 - 1.030   pH 6.0 5.0 - 8.0   Glucose, UA NEGATIVE NEGATIVE mg/dL   Hgb urine dipstick LARGE (A) NEGATIVE   Bilirubin Urine NEGATIVE NEGATIVE   Ketones, ur NEGATIVE NEGATIVE mg/dL   Protein, ur 30 (A) NEGATIVE mg/dL   Nitrite NEGATIVE NEGATIVE   Leukocytes, UA SMALL (A) NEGATIVE   RBC / HPF 6-10 0 - 5 RBC/hpf   WBC, UA 11-20 0 - 5 WBC/hpf   Bacteria, UA MANY (A) NONE SEEN   Squamous Epithelial / LPF 11-20 0 - 5   Mucus PRESENT   I-Stat beta hCG blood, ED  Result Value Ref Range   I-stat hCG, quantitative <5.0 <5 mIU/mL   Comment 3           EKG None  Radiology Ct Abdomen Pelvis W Contrast  Result Date: 08/01/2018 CLINICAL DATA:  Abdominal pain for the last week and a half, nausea, vomiting, diarrhea, history of Crohn's disease diagnosed in 2013 with partial bowel resection at that time EXAM: CT ABDOMEN AND PELVIS WITH CONTRAST TECHNIQUE: Multidetector CT imaging of the abdomen and pelvis was performed using the standard protocol following bolus administration of intravenous contrast. CONTRAST:  187m ISOVUE-300 IOPAMIDOL (ISOVUE-300) INJECTION 61% COMPARISON:  CT abdomen pelvis of 06/15/2017 FINDINGS: Lower chest: Lung bases are clear. The heart is within normal limits in size. Hepatobiliary: The liver enhances with no focal abnormality. No ductal dilatation is seen. Surgical clips are present from prior cholecystectomy. Pancreas: The pancreas is normal in size and the pancreatic duct is not dilated. The peripancreatic fat  planes are well preserved. Spleen: The spleen is unremarkable. Adrenals/Urinary Tract: The adrenal glands appear normal. The kidneys enhance with no calculus or mass. No hydronephrosis is seen. The ureters appear normal in caliber. The urinary bladder is moderately distended with no abnormality noted.  Stomach/Bowel: The stomach is distended with fluid and air. No abnormality is seen. No dilatation of small bowel is seen and no mucosal edema is evident. The colon is largely decompressed. No mucosal edema of the colon is seen. The cecum is decompressed as well. The terminal ileum is unremarkable with no evidence of edema or enhancement. The appendix appears to have been removed. Vascular/Lymphatic: The abdominal aorta is normal in caliber. No adenopathy is seen with only small mesenteric lymph nodes present. Reproductive: The uterus appears normal in size. There may be small follicles present bilaterally. There is a small to moderate amount of free fluid layering in the pelvis most likely due to a recently ruptured ovarian cyst. Other: No abnormality of the abdominal wall is seen. Musculoskeletal: The lumbar vertebrae are in normal alignment with normal intervertebral disc spaces. The SI joints appear well corticated. IMPRESSION: 1. Small ovarian follicles are present and there is a small to moderate amount of free fluid in the pelvis, most likely due to a recently ruptured ovarian cyst. 2. No evidence of large or small bowel edema is seen. No abscess is noted. Electronically Signed   By: Ivar Drape M.D.   On: 08/01/2018 14:37    Procedures Procedures (including critical care time)  Medications Ordered in ED Medications  iopamidol (ISOVUE-300) 61 % injection 30 mL (30 mLs Oral Contrast Given 08/01/18 1311)  iopamidol (ISOVUE-300) 61 % injection (has no administration in time range)  sodium chloride 0.9 % bolus 1,000 mL (0 mLs Intravenous Stopped 08/01/18 1325)  HYDROmorphone (DILAUDID) injection 0.25 mg (0.25 mg Intravenous Given 08/01/18 1201)  ondansetron (ZOFRAN) injection 4 mg (4 mg Intravenous Given 08/01/18 1201)  famotidine (PEPCID) IVPB 20 mg premix (0 mg Intravenous Stopped 08/01/18 1233)  HYDROmorphone (DILAUDID) injection 0.5 mg (0.5 mg Intravenous Given 08/01/18 1324)  iopamidol (ISOVUE-300)  61 % injection 100 mL (100 mLs Intravenous Contrast Given 08/01/18 1410)  promethazine (PHENERGAN) tablet 25 mg (25 mg Oral Given 08/01/18 1525)     Initial Impression / Assessment and Plan / ED Course  I have reviewed the triage vital signs and the nursing notes.  Pertinent labs & imaging results that were available during my care of the patient were reviewed by me and considered in my medical decision making (see chart for details).    Patient with hx of Crohns presents to the ED with complaints of abdominal pain with associated N/V/D. Patient nontoxic appearing, in no apparent distress, vitals WNL. On exam patient tender to the generalized abdomen, non focal, no peritoneal signs. Given her pain is generalized with N/V/D, she is without vaginal discharge/bleeding, she is not concerned for STDs, do not feel pelvic exam is necessary at this time. Will evaluate with labs and CT abdomen pelvis. Analgesics, anti-emetics, and fluids administered.   11:00: RN informed me of delay due to difficultly establishing IV access, patient refused US guided IV, IV team has been consulted.   Labs reviewed and grossly unremarkable. No leukocytosis, no anemia, no significant electrolyte derangements, she does have mild hypokalemia at 3.2, prescription for oral replacement and diet recommendations provided. LFTs, renal function, and lipase without significant abnormality. Urinalysis with bacteriuria, proteinuria, and hematuria, patient is without urinary sxs, suspect asymptomatic, will culture  and have her repeat UA with PCP. Pregnancy test is negative.   Imaging included CT abdomen/pelvis w contrast: IMPRESSION: 1. Small ovarian follicles are present and there is a small to moderate amount of free fluid in the pelvis, most likely due to a recently ruptured ovarian cyst. 2. No evidence of large or small bowel edema is seen. No abscess is noted. Electronically Signed   By: Ivar Drape M.D.   On: 08/01/2018 14:37    Patient does not appear to have findings consistent with acute crohns flare on CT imaging.  On repeat abdominal exam patient remains without peritoneal signs, her pain has significantly improved throughout ER stay, doubt cholecystitis, pancreatitis, diverticulitis, appendicitis, bowel obstruction/perforation, PID, or ectopic pregnancy. At this time suspect her sxs are multifactorial with likely recently ruptured ovarian cyst and possible viral GI illness. Upon review of results, patient requesting discharge, she did have an episode of vomiting after zofran, I discussed phenergan administration with PO challenge, she states she did not want to stay any longer. She was sipping gingerale without nausea prior to DC. Will discharge home with supportive measures. I discussed results, treatment plan, need for PCP, GI, and obgyn follow-up, as well as return precautions with the patient and her girlfriend at bedside. Provided opportunity for questions, patient and her girlfriend confirmed understanding and are in agreement with plan.   Vitals:   08/01/18 0938 08/01/18 1526  BP: 122/79 127/73  Pulse: 83 77  Resp: 18 16  Temp: 98.3 F (36.8 C)   SpO2: 100% 97%    Final Clinical Impressions(s) / ED Diagnoses   Final diagnoses:  Generalized abdominal pain  Nausea vomiting and diarrhea  Cyst of ovary, unspecified laterality    ED Discharge Orders         Ordered    promethazine (PHENERGAN) 25 MG tablet  Every 6 hours PRN     08/01/18 1520    potassium chloride SA (K-DUR,KLOR-CON) 20 MEQ tablet  Daily     08/01/18 1520           Korin Setzler, [place], PA-C 08/01/18 1548    Hayden Rasmussen, MD 08/01/18 1807

## 2018-08-01 NOTE — ED Notes (Signed)
Patient attempted to stick with IV, patient stated she did not want ultrasound IV due to the IV bleeding back. Informed patient that ultrasound would be a preferred method, patient declined. Attempted to get another nurse to stick patient, unable to get IV access. IV team order placed and patient updated on reason for wait.

## 2018-08-02 ENCOUNTER — Encounter (HOSPITAL_COMMUNITY): Payer: Self-pay

## 2018-08-02 ENCOUNTER — Emergency Department (HOSPITAL_COMMUNITY)
Admission: EM | Admit: 2018-08-02 | Discharge: 2018-08-02 | Disposition: A | Discharge status: Home / Self Care | Payer: 59 | Attending: Emergency Medicine | Admitting: Emergency Medicine

## 2018-08-02 ENCOUNTER — Other Ambulatory Visit: Payer: Self-pay

## 2018-08-02 ENCOUNTER — Emergency Department (HOSPITAL_COMMUNITY): Payer: 59

## 2018-08-02 DIAGNOSIS — R1084 Generalized abdominal pain: Secondary | ICD-10-CM | POA: Diagnosis not present

## 2018-08-02 DIAGNOSIS — K5090 Crohn's disease, unspecified, without complications: Secondary | ICD-10-CM | POA: Insufficient documentation

## 2018-08-02 DIAGNOSIS — E876 Hypokalemia: Secondary | ICD-10-CM | POA: Insufficient documentation

## 2018-08-02 DIAGNOSIS — N83299 Other ovarian cyst, unspecified side: Secondary | ICD-10-CM | POA: Insufficient documentation

## 2018-08-02 DIAGNOSIS — Z792 Long term (current) use of antibiotics: Secondary | ICD-10-CM | POA: Diagnosis not present

## 2018-08-02 DIAGNOSIS — R1030 Lower abdominal pain, unspecified: Secondary | ICD-10-CM | POA: Diagnosis present

## 2018-08-02 LAB — CBC WITH DIFFERENTIAL/PLATELET
BASOS ABS: 0 10*3/uL (ref 0.0–0.1)
BASOS PCT: 1 %
EOS ABS: 0 10*3/uL (ref 0.0–0.7)
EOS PCT: 0 %
HCT: 40.1 % (ref 36.0–46.0)
Hemoglobin: 13.6 g/dL (ref 12.0–15.0)
Lymphocytes Relative: 40 %
Lymphs Abs: 1.7 10*3/uL (ref 0.7–4.0)
MCH: 29.6 pg (ref 26.0–34.0)
MCHC: 33.9 g/dL (ref 30.0–36.0)
MCV: 87.2 fL (ref 78.0–100.0)
Monocytes Absolute: 0.4 10*3/uL (ref 0.1–1.0)
Monocytes Relative: 9 %
Neutro Abs: 2.1 10*3/uL (ref 1.7–7.7)
Neutrophils Relative %: 50 %
PLATELETS: 324 10*3/uL (ref 150–400)
RBC: 4.6 MIL/uL (ref 3.87–5.11)
RDW: 12.3 % (ref 11.5–15.5)
WBC: 4.2 10*3/uL (ref 4.0–10.5)

## 2018-08-02 LAB — COMPREHENSIVE METABOLIC PANEL
ALBUMIN: 4 g/dL (ref 3.5–5.0)
ALT: 13 U/L (ref 0–44)
AST: 19 U/L (ref 15–41)
Alkaline Phosphatase: 71 U/L (ref 38–126)
Anion gap: 11 (ref 5–15)
BUN: 6 mg/dL (ref 6–20)
CHLORIDE: 103 mmol/L (ref 98–111)
CO2: 25 mmol/L (ref 22–32)
CREATININE: 0.8 mg/dL (ref 0.44–1.00)
Calcium: 9.2 mg/dL (ref 8.9–10.3)
GFR calc Af Amer: >60 mL/min (ref >60)
GFR calc non Af Amer: >60 mL/min (ref >60)
Glucose, Bld: 80 mg/dL (ref 70–99)
POTASSIUM: 3.2 mmol/L — AB (ref 3.5–5.1)
SODIUM: 139 mmol/L (ref 135–145)
Total Bilirubin: 0.8 mg/dL (ref 0.3–1.2)
Total Protein: 8.7 g/dL — ABNORMAL HIGH (ref 6.5–8.1)

## 2018-08-02 MED ORDER — SODIUM CHLORIDE 0.9 % IV BOLUS
1000.0000 mL | Freq: Once | INTRAVENOUS | Status: AC
  Administered 2018-08-02: 1000 mL via INTRAVENOUS

## 2018-08-02 MED ORDER — HYDROMORPHONE HCL 1 MG/ML IJ SOLN
0.5000 mg | Freq: Once | INTRAMUSCULAR | Status: AC
  Administered 2018-08-02: 0.5 mg via INTRAVENOUS
  Filled 2018-08-02: qty 1

## 2018-08-02 MED ORDER — NITROFURANTOIN MONOHYD MACRO 100 MG PO CAPS: 100.0000 mg | ORAL_CAPSULE | Freq: Two times a day (BID) | ORAL | 0 refills | Status: DC

## 2018-08-02 MED ORDER — ONDANSETRON 4 MG PO TBDP: 4.0000 mg | ORAL_TABLET | Freq: Three times a day (TID) | ORAL | 0 refills | Status: DC | PRN

## 2018-08-02 MED ORDER — KETOROLAC TROMETHAMINE 15 MG/ML IJ SOLN
15.0000 mg | Freq: Once | INTRAMUSCULAR | Status: AC
  Administered 2018-08-02: 15 mg via INTRAVENOUS
  Filled 2018-08-02: qty 1

## 2018-08-02 MED ORDER — ONDANSETRON HCL 4 MG/2ML IJ SOLN
4.0000 mg | Freq: Once | INTRAMUSCULAR | Status: AC
  Administered 2018-08-02: 4 mg via INTRAVENOUS
  Filled 2018-08-02: qty 2

## 2018-08-02 NOTE — ED Provider Notes (Signed)
[facility] DEPT Provider Note   CSN: [id number] Arrival date & time: 08/02/18  1141   History   Chief Complaint Chief Complaint  Patient presents with  . Abdominal Pain  . Ovarian Cyst    HPI Julie Ward is a 22 y.o. female past medical history significant for Crohn's, ovarian cyst who presents for evaluation of abdominal pain.  Per patient, she was seen in the ED yesterday 08/01/2018 for her abdominal pain.  Patient was discharged at that time with Phenergan and potassium supplementation.  Patient states her pain has returned and is rated a 8/10.  She describes the pain as a sharp and burning pain across her entire lower abdomen.  Admits to nausea, vomiting and diarrhea x1 week.  Has not noticed any blood in her stools or emesis.  Denies fever, chills, chest pain, shortness of breath, dysuria, hematuria, vaginal discharge, vaginal bleeding, recent travel, recent antibiotic use.  Denies aggravating or alleviating factors.  Has tried Tylenol without relief.  Admits to history of Crohn's, her last visit with [facility] was over one year ago.  She is not on any medication for her Crohn's.  States this pain does not feel like her Crohn's flare.  Denies recent travel or antibiotic use.  Patient states that she is unable to take oral pain medication secondary to anxiety.  States she has used Dilaudid in the past which is worked best for her pain.  HPI  Past Medical History:  Diagnosis Date  . Abdominal pain, recurrent   . Allergy   . Anxiety   . Appendicitis   . Crohn's disease of colon ([place]) 10/14   sees Dr. Blanca Friend at [facility]   . Dysmenorrhea   . GERD (gastroesophageal reflux disease)    not taking any medication  . Shortness of breath dyspnea    ith thyroid problem    Patient Active Problem List   Diagnosis Date Noted  . Goiter, non-toxic 04/29/2015  . Vomiting 10/05/2014  . Hypokalemia 10/05/2014  . Elevated LFTs 10/05/2014  . Nausea  and vomiting 10/04/2014  . Thyroid nodule 10/04/2014  . Abdominal pain 10/04/2014  . Low TSH level 07/18/2014  . Thyromegaly 06/06/2014  . Hives 03/08/2014  . Crohn's disease ([place]) 03/01/2014  . Heme + stool 08/28/2013  . Abdominal pain, other specified site 08/28/2013  . Change in bowel habits 08/28/2013  . Postop check 03/03/2012  . Generalized abdominal pain   . PITYRIASIS ROSEA 09/11/2010  . GERD 07/23/2010  . DYSMENORRHEA 07/23/2010  . BOILS, RECURRENT 10/22/2008    Past Surgical History:  Procedure Laterality Date  . APPENDECTOMY  07-14-11   laparoscopic   . CHOLECYSTECTOMY  02/11/2012   Procedure: LAPAROSCOPIC CHOLECYSTECTOMY WITH INTRAOPERATIVE CHOLANGIOGRAM;  Surgeon: Gwenyth Ober, MD;  Location: [facility];  Service: General;  Laterality: N/A;  . ESOPHAGOGASTRODUODENOSCOPY  01/28/2012   Procedure: ESOPHAGOGASTRODUODENOSCOPY (EGD);  Surgeon: Oletha Blend, MD;  Location: [facility];  Service: Gastroenterology;  Laterality: N/A;  . THYROID LOBECTOMY Left 04/29/2015   Procedure: LEFT TOTAL THYROID LOBECTOMY;  Surgeon: Judeth Horn, MD;  Location: [facility];  Service: General;  Laterality: Left;  . WISDOM TOOTH EXTRACTION       OB History    Gravida  0   Para      Term      Preterm      AB      Living        SAB  TAB      Ectopic      Multiple      Live Births               Home Medications    Prior to Admission medications   Medication Sig Start Date End Date Taking? Authorizing Provider  acetaminophen (TYLENOL) 500 MG tablet Take 1,000 mg by mouth 2 (two) times daily as needed for mild pain.   Yes [provider]  promethazine (PHENERGAN) 25 MG tablet Take 1 tablet (25 mg total) by mouth every 6 (six) hours as needed for nausea or vomiting. 08/01/18  Yes Petrucelli, Samantha R, PA-C  nitrofurantoin, macrocrystal-monohydrate, (MACROBID) 100 MG capsule Take 1 capsule (100 mg total) by mouth 2 (two) times daily. 08/02/18   Cyris Maalouf A, PA-C    ondansetron (ZOFRAN ODT) 4 MG disintegrating tablet Take 1 tablet (4 mg total) by mouth every 8 (eight) hours as needed for nausea or vomiting. 08/02/18   Lanell Dubie A, PA-C  potassium chloride SA (K-DUR,KLOR-CON) 20 MEQ tablet Take 1 tablet (20 mEq total) by mouth daily. Patient not taking: Reported on 08/02/2018 08/01/18   Petrucelli, Glynda Jaeger, PA-C  metoCLOPramide (REGLAN) 10 MG tablet Take 1 tablet (10 mg total) by mouth every 6 (six) hours. 01/19/12 01/20/12  Barbara Cower, MD    Family History Family History  Problem Relation Age of Onset  . Cancer Paternal Aunt        ovarian, kidney  . Cancer Paternal Uncle        colon  . Colon cancer Paternal Uncle   . Cancer Maternal Grandmother        breast   . Hypertension Maternal Grandmother   . Irritable bowel syndrome Maternal Grandmother   . Heart disease Maternal Grandmother   . Hypertension Paternal Grandmother   . Hyperlipidemia Paternal Grandmother     Social History Social History   Tobacco Use  . Smoking status: Never Smoker  . Smokeless tobacco: Never Used  Substance Use Topics  . Alcohol use: Yes    Alcohol/week: 0.0 standard drinks    Comment: occ  . Drug use: No     Allergies   Penicillins; Fentanyl; Morphine and related; Oxycontin [oxycodone hcl]; Prednisone; and Valium [diazepam]   Review of Systems Review of Systems  Constitutional: Negative for activity change, appetite change, chills, diaphoresis, fatigue, fever and unexpected weight change.  Respiratory: Negative.   Cardiovascular: Negative.   Gastrointestinal: Positive for abdominal pain, diarrhea, nausea and vomiting. Negative for abdominal distention, blood in stool and constipation.  Genitourinary: Negative.   Skin: Negative.   Neurological: Negative for dizziness and weakness.     Physical Exam Updated Vital Signs BP 122/60   Pulse 68   Temp 98.4 F (36.9 C) (Oral)   Resp 17   Ht 5' 7"  (1.702 m)   Wt 109.3 kg   LMP  07/28/2018   SpO2 100%   BMI 37.74 kg/m   Physical Exam  Constitutional: She appears well-developed and well-nourished. No distress.  HENT:  Head: Atraumatic.  Eyes: Pupils are equal, round, and reactive to light.  Neck: Normal range of motion. Neck supple.  Cardiovascular: Normal rate and regular rhythm.  Pulmonary/Chest: Effort normal and breath sounds normal. No respiratory distress.  Abdominal: Soft. Normal appearance and bowel sounds are normal. She exhibits no shifting dullness, no distension, no pulsatile liver, no fluid wave, no abdominal bruit, no ascites, no pulsatile midline mass and no mass. There is no hepatosplenomegaly.  There is tenderness in the right lower quadrant, suprapubic area and left lower quadrant. There is no rigidity, no rebound, no guarding, no CVA tenderness, no tenderness at McBurney's point and negative Murphy's sign.  Musculoskeletal: Normal range of motion.  Neurological: She is alert.  Skin: Skin is warm and dry. She is not diaphoretic.  Psychiatric: She has a normal mood and affect.  Nursing note and vitals reviewed.    ED Treatments / Results  Labs (all labs ordered are listed, but only abnormal results are displayed) Labs Reviewed  COMPREHENSIVE METABOLIC PANEL - Abnormal; Notable for the following components:      Result Value   Potassium 3.2 (*)    Total Protein 8.7 (*)    All other components within normal limits  CBC WITH DIFFERENTIAL/PLATELET    EKG None  Radiology US Pelvis Complete  Result Date: 08/02/2018 CLINICAL DATA:  Abdominal pain.  Assess for ruptured adnexal cyst. EXAM: LIMITED ULTRASOUND OF PELVIS TECHNIQUE: Transabdominal ultrasound examination of the pelvis was performed. Patient did not wish to undergo endovaginal imaging. COMPARISON:  CT abdomen and pelvis August 01, 2018 at 1411 hours. FINDINGS: Uterus: Measurements: 6.7 x 3 x 3.8 cm. No fibroids or other mass visualized. Endometrium Thickness: 5 mm.  No focal  abnormality visualized. Right ovary Measurements: 2.8 x 1.8 x 2.4 cm. Normal appearance/no adnexal mass. Left ovary Measurements: 3 x 1.6 x 2.6 cm.  Normal appearance/no adnexal mass. Small volume free fluid, likely physiologic. IMPRESSION: Negative transabdominal pelvic ultrasound. Electronically Signed   By: Elon Alas M.D.   On: 08/02/2018 17:59   Ct Abdomen Pelvis W Contrast  Result Date: 08/01/2018 CLINICAL DATA:  Abdominal pain for the last week and a half, nausea, vomiting, diarrhea, history of Crohn's disease diagnosed in 2013 with partial bowel resection at that time EXAM: CT ABDOMEN AND PELVIS WITH CONTRAST TECHNIQUE: Multidetector CT imaging of the abdomen and pelvis was performed using the standard protocol following bolus administration of intravenous contrast. CONTRAST:  12m ISOVUE-300 IOPAMIDOL (ISOVUE-300) INJECTION 61% COMPARISON:  CT abdomen pelvis of 06/15/2017 FINDINGS: Lower chest: Lung bases are clear. The heart is within normal limits in size. Hepatobiliary: The liver enhances with no focal abnormality. No ductal dilatation is seen. Surgical clips are present from prior cholecystectomy. Pancreas: The pancreas is normal in size and the pancreatic duct is not dilated. The peripancreatic fat planes are well preserved. Spleen: The spleen is unremarkable. Adrenals/Urinary Tract: The adrenal glands appear normal. The kidneys enhance with no calculus or mass. No hydronephrosis is seen. The ureters appear normal in caliber. The urinary bladder is moderately distended with no abnormality noted. Stomach/Bowel: The stomach is distended with fluid and air. No abnormality is seen. No dilatation of small bowel is seen and no mucosal edema is evident. The colon is largely decompressed. No mucosal edema of the colon is seen. The cecum is decompressed as well. The terminal ileum is unremarkable with no evidence of edema or enhancement. The appendix appears to have been removed. Vascular/Lymphatic:  The abdominal aorta is normal in caliber. No adenopathy is seen with only small mesenteric lymph nodes present. Reproductive: The uterus appears normal in size. There may be small follicles present bilaterally. There is a small to moderate amount of free fluid layering in the pelvis most likely due to a recently ruptured ovarian cyst. Other: No abnormality of the abdominal wall is seen. Musculoskeletal: The lumbar vertebrae are in normal alignment with normal intervertebral disc spaces. The SI joints appear well corticated.  IMPRESSION: 1. Small ovarian follicles are present and there is a small to moderate amount of free fluid in the pelvis, most likely due to a recently ruptured ovarian cyst. 2. No evidence of large or small bowel edema is seen. No abscess is noted. Electronically Signed   By: Ivar Drape M.D.   On: 08/01/2018 14:37    Procedures Procedures (including critical care time)  Medications Ordered in ED Medications  sodium chloride 0.9 % bolus 1,000 mL (0 mLs Intravenous Stopped 08/02/18 1717)  ketorolac (TORADOL) 15 MG/ML injection 15 mg (15 mg Intravenous Given 08/02/18 1544)  HYDROmorphone (DILAUDID) injection 0.5 mg (0.5 mg Intravenous Given 08/02/18 1716)  ondansetron (ZOFRAN) injection 4 mg (4 mg Intravenous Given 08/02/18 1714)     Initial Impression / Assessment and Plan / ED Course  I have reviewed the triage vital signs and the nursing notes as well as past medical history.  Pertinent labs & imaging results that were available during my care of the patient were reviewed by me and considered in my medical decision making (see chart for details).  22 year with history of Chron's presents for evalaution of abdominal pain. Afebrile, non-ill, nonseptic appearing. CT scan 08/01/18 with mild free fluid in the pelvis, most likely due to recently ruptured ovarian cyst.  Recommendation for ultrasound, however at the time patient did not want to stay. She is without vaginal  discharge/bleeding and states she is not sexually active and is without concern for STDs.  Patient refused pelvic exam.  States this is not her "Crohn's pain."  Will obtain labs, ultrasound and pain management reevaluate. Patient refused transvaginal ultrasound.  Was able to obtain transabdominal ultrasound. Discussed limited imaging without transvaginal ultrasound and possible risks of not obtaining full imaging. Patient voiced understanding and still refuses pelvic and transvaginal ultrasound.  [place] negative and without cysts. Labs without Leukocytosis. Mild hypokalemia, currently on replacement therapy.  Repeat abdominal exam patient remains without peritoneal signs and her pain has improved.  Doubt cholecystitis, pancreatitis, diverticulitis, appendicitis, bowel obstruction, bowel perforation, PID, or ectopic pregnancy. Patient states she is ready for discharge and would like to follow up with GI.  She is requesting Zofran ODT as she states the Phenergan she was prescribed yesterday did not work.  Was able to tolerate Gatorade without emesis prior to DC.  Patient states she would like to be treated for her urinary tract infection from her urine on 08/01/2018.  Will DC home with antibiotic.  Discussed strict return precautions.  Patient and family member voiced understanding.    Final Clinical Impressions(s) / ED Diagnoses   Final diagnoses:  Generalized abdominal pain    ED Discharge Orders         Ordered    nitrofurantoin, macrocrystal-monohydrate, (MACROBID) 100 MG capsule  2 times daily     08/02/18 1840    ondansetron (ZOFRAN ODT) 4 MG disintegrating tablet  Every 8 hours PRN     08/02/18 1840           Barrington Worley A, PA-C 08/02/18 1945    Little, Wenda Overland, MD 08/04/18 1004

## 2018-08-02 NOTE — ED Notes (Signed)
Patient having Ultrasound

## 2018-08-02 NOTE — ED Triage Notes (Signed)
Pt was seen here yesterday with abdominal pain. Pt states she was told yesterday she had a ruptured ovarian cyst, and should have stayed. Pt states that her pain has not been relieved at all since.

## 2018-08-02 NOTE — Discharge Instructions (Signed)
Your evaluated today for abdominal pain your labs and imaging were negative.  Please plan to follow-up with GI and OB/GYN for recurrent symptoms. Return to the Ed with any new or worsening symptoms such as:   Get help right away if: Your pain does not go away as soon as your doctor says it should. You cannot stop throwing up. Your pain is only in areas of your belly, such as the right side or the left lower part of the belly. You have bloody or black poop, or poop that looks like tar. You have very bad pain, cramping, or bloating in your belly. You have signs of not having enough fluid or water in your body (dehydration), such as: Dark pee, very little pee, or no pee. Cracked lips. Dry mouth. Sunken eyes. Sleepiness. Weakness.

## 2018-08-02 NOTE — ED Notes (Addendum)
Pt reports 9/10 sharp and burning pain and reports being told of a ruptured cyst yesterday with associated nausea and vomiting. . Pt denies pain and burning with urination. Pt does report that she does reports she has Crohn's and has had  diarrhea x 1  week. Pt  Has family at the bedside.

## 2018-08-03 LAB — URINE CULTURE

## 2019-02-07 ENCOUNTER — Telehealth: Payer: Self-pay | Admitting: Family Medicine

## 2019-02-07 NOTE — Telephone Encounter (Signed)
Dr. Fry please advise. Thanks  

## 2019-02-07 NOTE — Telephone Encounter (Signed)
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

## 2019-02-07 NOTE — Telephone Encounter (Signed)
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  She stated that they are trying to out source people to work from home at this time and they advised her that if she could get a doctor's note, that she could be one of the first people to be able to do this.  She stated that for now the note could say 14-21 days.

## 2019-02-07 NOTE — Telephone Encounter (Signed)
Ask her what type of job she has and what kind of time frame is she thinking about?

## 2019-02-07 NOTE — Telephone Encounter (Signed)
The letter is ready

## 2019-02-12 NOTE — Telephone Encounter (Signed)
Called and spoke with pt and she is aware of letter that is up front and she will come by this week to pick this up.

## 2019-03-23 ENCOUNTER — Encounter: Payer: Self-pay | Admitting: Family Medicine

## 2019-03-23 ENCOUNTER — Ambulatory Visit (INDEPENDENT_AMBULATORY_CARE_PROVIDER_SITE_OTHER): Payer: 59 | Admitting: Family Medicine

## 2019-03-23 ENCOUNTER — Ambulatory Visit: Payer: Self-pay | Admitting: *Deleted

## 2019-03-23 ENCOUNTER — Other Ambulatory Visit: Payer: Self-pay

## 2019-03-23 DIAGNOSIS — J3089 Other allergic rhinitis: Secondary | ICD-10-CM | POA: Diagnosis not present

## 2019-03-23 NOTE — Progress Notes (Signed)
Subjective:    Patient ID: Julie Ward, female    DOB: 1996/08/01, 23 y.o.   MRN: [id number]  HPI Virtual Visit via Video Note  I connected with the patient on 03/23/19 at  3:00 PM EDT by a video enabled telemedicine application and verified that I am speaking with the correct person using two identifiers.  Location patient: home Location provider:work or home office Persons participating in the virtual visit: patient, provider  I discussed the limitations of evaluation and management by telemedicine and the availability of in person appointments. The patient expressed understanding and agreed to proceed.   HPI: Here for 2 weeks of a ST. She does not feel sick, no headache or fever. No coughing. She has some PND and sneezing. Drinking fluids and using Sucrets lozenges.    ROS: See pertinent positives and negatives per HPI.  Past Medical History:  Diagnosis Date  . Abdominal pain, recurrent   . Allergy   . Anxiety   . Appendicitis   . Crohn's disease of colon ([place]) 10/14   sees Dr. Blanca Friend at [facility]   . Dysmenorrhea   . GERD (gastroesophageal reflux disease)    not taking any medication  . Shortness of breath dyspnea    ith thyroid problem    Past Surgical History:  Procedure Laterality Date  . APPENDECTOMY  07-14-11   laparoscopic   . CHOLECYSTECTOMY  02/11/2012   Procedure: LAPAROSCOPIC CHOLECYSTECTOMY WITH INTRAOPERATIVE CHOLANGIOGRAM;  Surgeon: Gwenyth Ober, MD;  Location: [facility];  Service: General;  Laterality: N/A;  . ESOPHAGOGASTRODUODENOSCOPY  01/28/2012   Procedure: ESOPHAGOGASTRODUODENOSCOPY (EGD);  Surgeon: Oletha Blend, MD;  Location: [facility];  Service: Gastroenterology;  Laterality: N/A;  . THYROID LOBECTOMY Left 04/29/2015   Procedure: LEFT TOTAL THYROID LOBECTOMY;  Surgeon: Judeth Horn, MD;  Location: [facility];  Service: General;  Laterality: Left;  . WISDOM TOOTH EXTRACTION      Family History  Problem Relation Age of Onset  . Cancer Paternal  Aunt        ovarian, kidney  . Cancer Paternal Uncle        colon  . Colon cancer Paternal Uncle   . Cancer Maternal Grandmother        breast   . Hypertension Maternal Grandmother   . Irritable bowel syndrome Maternal Grandmother   . Heart disease Maternal Grandmother   . Hypertension Paternal Grandmother   . Hyperlipidemia Paternal Grandmother      Current Outpatient Medications:  .  acetaminophen (TYLENOL) 500 MG tablet, Take 1,000 mg by mouth 2 (two) times daily as needed for mild pain., Disp: , Rfl:  .  ondansetron (ZOFRAN ODT) 4 MG disintegrating tablet, Take 1 tablet (4 mg total) by mouth every 8 (eight) hours as needed for nausea or vomiting., Disp: 20 tablet, Rfl: 0 .  potassium chloride SA (K-DUR,KLOR-CON) 20 MEQ tablet, Take 1 tablet (20 mEq total) by mouth daily., Disp: 3 tablet, Rfl: 0 .  promethazine (PHENERGAN) 25 MG tablet, Take 1 tablet (25 mg total) by mouth every 6 (six) hours as needed for nausea or vomiting., Disp: 6 tablet, Rfl: 0  EXAM:  VITALS per patient if applicable:  GENERAL: alert, oriented, appears well and in no acute distress  HEENT: atraumatic, conjunttiva clear, no obvious abnormalities on inspection of external nose and ears  NECK: normal movements of the head and neck  LUNGS: on inspection no signs of respiratory distress, breathing rate appears normal, no obvious gross SOB,  gasping or wheezing  CV: no obvious cyanosis  MS: moves all visible extremities without noticeable abnormality  PSYCH/NEURO: pleasant and cooperative, no obvious depression or anxiety, speech and thought processing grossly intact  ASSESSMENT AND PLAN: This sounds like seasonal allergies causing PND. I suggested she try using Flonase and Claritin daily. Recheck prn.  Alysia Penna, MD  Discussed the following assessment and plan:  No diagnosis found.     I discussed the assessment and treatment plan with the patient. The patient was provided an opportunity to ask  questions and all were answered. The patient agreed with the plan and demonstrated an understanding of the instructions.   The patient was advised to call back or seek an in-person evaluation if the symptoms worsen or if the condition fails to improve as anticipated.     Review of Systems     Objective:   Physical Exam        Assessment & Plan:

## 2019-03-23 NOTE — Telephone Encounter (Signed)
Pt reports sore throat x 2 weeks. Has been trying different OTC meds, ineffective. States had lost voice, but "Returning now." States afebrile, no other symptoms. Reports throat red, no white patches. No travel, no known exposure. Call transferred to practice, [facility] for consideration of virtual appt. Email and phone number verified.   Reason for Disposition . Cough present > 3 weeks    Sore throat x 2 weeks  Answer Assessment - Initial Assessment Questions 1. COVID-19 DIAGNOSIS: "Who made your Coronavirus (COVID-19) diagnosis?" "Was it confirmed by a positive lab test?" If not diagnosed by a HCP, ask "Are there lots of cases (community spread) where you live?" (See public health department website, if unsure)   * MAJOR community spread: high number of cases; numbers of cases are increasing; many people hospitalized.   * MINOR community spread: low number of cases; not increasing; few or no people hospitalized     N/A 2. ONSET: "When did the COVID-19 symptoms start?"      2 weeks ago 3. WORST SYMPTOM: "What is your worst symptom?" (e.g., cough, fever, shortness of breath, muscle aches)     Sore throat 4. COUGH: "Do you have a cough?" If so, ask: "How bad is the cough?"       no 5. FEVER: "Do you have a fever?" If so, ask: "What is your temperature, how was it measured, and when did it start?"     no 6. RESPIRATORY STATUS: "Describe your breathing?" (e.g., shortness of breath, wheezing, unable to speak)      wnl 7. BETTER-SAME-WORSE: "Are you getting better, staying the same or getting worse compared to yesterday?"  If getting worse, ask, "In what way?"     same 8. HIGH RISK DISEASE: "Do you have any chronic medical problems?" (e.g., asthma, heart or lung disease, weak immune system, etc.)     no 9. PREGNANCY: "Is there any chance you are pregnant?" "When was your last menstrual period?"      10. OTHER SYMPTOMS: "Do you have any other symptoms?"  (e.g., runny nose, headache, sore throat,  loss of smell)       Lost voice "Coming back"  Protocols used: CORONAVIRUS (COVID-19) DIAGNOSED OR SUSPECTED-A-AH

## 2019-03-23 NOTE — Telephone Encounter (Signed)
Pt scheduled for OV today.

## 2019-04-04 ENCOUNTER — Ambulatory Visit (INDEPENDENT_AMBULATORY_CARE_PROVIDER_SITE_OTHER): Payer: 59 | Admitting: Family Medicine

## 2019-04-04 ENCOUNTER — Encounter: Payer: Self-pay | Admitting: Family Medicine

## 2019-04-04 ENCOUNTER — Encounter: Payer: Self-pay | Admitting: *Deleted

## 2019-04-04 ENCOUNTER — Other Ambulatory Visit: Payer: Self-pay

## 2019-04-04 DIAGNOSIS — R490 Dysphonia: Secondary | ICD-10-CM | POA: Diagnosis not present

## 2019-04-04 DIAGNOSIS — K219 Gastro-esophageal reflux disease without esophagitis: Secondary | ICD-10-CM

## 2019-04-04 MED ORDER — ESOMEPRAZOLE MAGNESIUM 40 MG PO CPDR: 40.0000 mg | DELAYED_RELEASE_CAPSULE | Freq: Every day | ORAL | 1 refills | Status: DC

## 2019-04-04 NOTE — Progress Notes (Signed)
Virtual Visit via Video Note   I connected with Ms Schermerhorn on 04/04/19 at  2:45 PM EDT by a video enabled telemedicine application and verified that I am speaking with the correct person using two identifiers.  Location patient: home Location provider:home office Persons participating in the virtual visit: patient, provider  I discussed the limitations of evaluation and management by telemedicine and the availability of in person appointments. The patient expressed understanding and agreed to proceed.   HPI: Ms Metzner is a 23 yo female c/o persistent dysphonia. Problem has been intermittently for the past 3 weeks, initially she had sore throat, which has improved. Problem seems to be worse at the end of the day, she needs to be on the phone several times during the day as per her chart. In the morning it seems to be better. She denies dysphasia or stridor. Denies abnormal wt loss,night sweats,sowlen glands. No Hx of tobacco use. Negative for cough, wheezing, dyspnea, palpitation, abdominal pain, nausea, or vomiting.  Problem is stable.  She has history of allergies, currently she is on Flonase nasal spray and Claritin 10 mg daily.  She has past history of GERD, she is no longer on PPIs. She is still having acid reflux and heartburn but not as bad or as frequent as she did before. Exacerbated by some type of foods. No alleviating factors identified.  ROS: See pertinent positives and negatives per HPI.  Past Medical History:  Diagnosis Date  . Abdominal pain, recurrent   . Allergy   . Anxiety   . Appendicitis   . Crohn's disease of colon ([place]) 10/14   sees Dr. Blanca Friend at [place]   . Dysmenorrhea   . GERD (gastroesophageal reflux disease)    not taking any medication  . Shortness of breath dyspnea    ith thyroid problem    Past Surgical History:  Procedure Laterality Date  . APPENDECTOMY  07-14-11   laparoscopic   . CHOLECYSTECTOMY  02/11/2012   Procedure:  LAPAROSCOPIC CHOLECYSTECTOMY WITH INTRAOPERATIVE CHOLANGIOGRAM;  Surgeon: Gwenyth Ober, MD;  Location: [facility];  Service: General;  Laterality: N/A;  . ESOPHAGOGASTRODUODENOSCOPY  01/28/2012   Procedure: ESOPHAGOGASTRODUODENOSCOPY (EGD);  Surgeon: Oletha Blend, MD;  Location: [facility];  Service: Gastroenterology;  Laterality: N/A;  . THYROID LOBECTOMY Left 04/29/2015   Procedure: LEFT TOTAL THYROID LOBECTOMY;  Surgeon: Judeth Horn, MD;  Location: [facility];  Service: General;  Laterality: Left;  . WISDOM TOOTH EXTRACTION      Family History  Problem Relation Age of Onset  . Cancer Paternal Aunt        ovarian, kidney  . Cancer Paternal Uncle        colon  . Colon cancer Paternal Uncle   . Cancer Maternal Grandmother        breast   . Hypertension Maternal Grandmother   . Irritable bowel syndrome Maternal Grandmother   . Heart disease Maternal Grandmother   . Hypertension Paternal Grandmother   . Hyperlipidemia Paternal Grandmother     Social History   Socioeconomic History  . Marital status: Single    Spouse name: Not on file  . Number of children: 0  . Years of education: Not on file  . Highest education level: Not on file  Occupational History  . Occupation: [employer]  Social Needs  . Financial resource strain: Not on file  . Food insecurity:    Worry: Not on file    Inability: Not on file  .  Transportation needs:    Medical: Not on file    Non-medical: Not on file  Tobacco Use  . Smoking status: Never Smoker  . Smokeless tobacco: Never Used  Substance and Sexual Activity  . Alcohol use: Yes    Alcohol/week: 0.0 standard drinks    Comment: occ  . Drug use: No  . Sexual activity: Never  Lifestyle  . Physical activity:    Days per week: Not on file    Minutes per session: Not on file  . Stress: Not on file  Relationships  . Social connections:    Talks on phone: Not on file    Gets together: Not on file    Attends religious service: Not on file    Active member of  club or organization: Not on file    Attends meetings of clubs or organizations: Not on file    Relationship status: Not on file  . Intimate partner violence:    Fear of current or ex partner: Not on file    Emotionally abused: Not on file    Physically abused: Not on file    Forced sexual activity: Not on file  Other Topics Concern  . Not on file  Social History Narrative  . Not on file    Current Outpatient Medications:  .  acetaminophen (TYLENOL) 500 MG tablet, Take 1,000 mg by mouth 2 (two) times daily as needed for mild pain., Disp: , Rfl:  .  esomeprazole (NEXIUM) 40 MG capsule, Take 1 capsule (40 mg total) by mouth daily., Disp: 30 capsule, Rfl: 1 .  ondansetron (ZOFRAN ODT) 4 MG disintegrating tablet, Take 1 tablet (4 mg total) by mouth every 8 (eight) hours as needed for nausea or vomiting., Disp: 20 tablet, Rfl: 0 .  potassium chloride SA (K-DUR,KLOR-CON) 20 MEQ tablet, Take 1 tablet (20 mEq total) by mouth daily., Disp: 3 tablet, Rfl: 0 .  promethazine (PHENERGAN) 25 MG tablet, Take 1 tablet (25 mg total) by mouth every 6 (six) hours as needed for nausea or vomiting., Disp: 6 tablet, Rfl: 0  EXAM:  VITALS per patient if applicable:N/A  GENERAL: alert, oriented, appears well and in no acute distress  HEENT: atraumatic, conjunctiva clear, no obvious facial abnormalities on inspection. Mild dysphonia, no stridor appreciated.  NECK: normal movements of the head and neck  LUNGS: on inspection no signs of respiratory distress, breathing rate appears normal, no obvious gross SOB, gasping or wheezing  CV: no obvious cyanosis  PSYCH/NEURO: pleasant and cooperative, no obvious depression or anxiety, speech and thought processing grossly intact  ASSESSMENT AND PLAN:  Discussed the following assessment and plan:  Dysphonia We discussed possible etiologies including viral, allergy, and GI among some. Explained that given her age, no history of tobacco use, and clinical  presentation I do not think problem is caused by a serious process. Voice rest. Instructed about warning signs. If problem is persistent after 4 to 6 weeks, she may need ENT evaluation. As needed.   GERD Symptomatic, this problem could be contributing to dysphonia. GERD precautions discussed and recommended. Nexium 40 mg daily, 30 minutes before breakfast for 3 to 4 weeks, she can try to decrease dose if asymptomatic at that time. We discussed possible complications of GERD. Follow-up with PCP as needed.    I discussed the assessment and treatment plan with the patient. She was provided an opportunity to ask questions and all were answered. The patient agreed with the plan and demonstrated an understanding of the  instructions.   The patient was advised to call back or seek an in-person evaluation if the symptoms worsen or if the condition fails to improve as anticipated.  Return if symptoms worsen or fail to improve.    Lorey Pallett Martinique, MD

## 2019-04-04 NOTE — Assessment & Plan Note (Signed)
Symptomatic, this problem could be contributing to dysphonia. GERD precautions discussed and recommended. Nexium 40 mg daily, 30 minutes before breakfast for 3 to 4 weeks, she can try to decrease dose if asymptomatic at that time. We discussed possible complications of GERD. Follow-up with PCP as needed.

## 2019-04-16 ENCOUNTER — Telehealth: Payer: Self-pay | Admitting: *Deleted

## 2019-04-16 NOTE — Telephone Encounter (Signed)
Attempted to call the pt.  No VM so will try back later.

## 2019-04-16 NOTE — Telephone Encounter (Signed)
Copied from [facility] [id number]. Topic: General - Other >> Apr 13, 2019  2:47 PM Celene Kras A wrote: Reason for CRM: Pt called requesting another letter from the 5/27-5/29. Pt states she has not been able to talk that well up until today. Pt is requesting call back. Please advise.   Dr. Sarajane Jews please advise. Thanks

## 2019-04-17 ENCOUNTER — Emergency Department (HOSPITAL_COMMUNITY): Payer: 59

## 2019-04-17 ENCOUNTER — Emergency Department (HOSPITAL_COMMUNITY)
Admission: EM | Admit: 2019-04-17 | Discharge: 2019-04-17 | Disposition: A | Discharge status: Home / Self Care | Payer: 59 | Attending: Emergency Medicine | Admitting: Emergency Medicine

## 2019-04-17 ENCOUNTER — Other Ambulatory Visit: Payer: Self-pay

## 2019-04-17 ENCOUNTER — Encounter (HOSPITAL_COMMUNITY): Payer: Self-pay | Admitting: Emergency Medicine

## 2019-04-17 DIAGNOSIS — R197 Diarrhea, unspecified: Secondary | ICD-10-CM | POA: Diagnosis not present

## 2019-04-17 DIAGNOSIS — Z79899 Other long term (current) drug therapy: Secondary | ICD-10-CM | POA: Insufficient documentation

## 2019-04-17 DIAGNOSIS — R1110 Vomiting, unspecified: Secondary | ICD-10-CM | POA: Diagnosis not present

## 2019-04-17 DIAGNOSIS — R109 Unspecified abdominal pain: Secondary | ICD-10-CM | POA: Insufficient documentation

## 2019-04-17 DIAGNOSIS — K5090 Crohn's disease, unspecified, without complications: Secondary | ICD-10-CM | POA: Insufficient documentation

## 2019-04-17 LAB — CBC WITH DIFFERENTIAL/PLATELET
Abs Immature Granulocytes: 0.03 10*3/uL (ref 0.00–0.07)
Basophils Absolute: 0 10*3/uL (ref 0.0–0.1)
Basophils Relative: 0 %
Eosinophils Absolute: 0 10*3/uL (ref 0.0–0.5)
Eosinophils Relative: 0 %
HCT: 39.3 % (ref 36.0–46.0)
Hemoglobin: 13 g/dL (ref 12.0–15.0)
Immature Granulocytes: 0 %
Lymphocytes Relative: 9 %
Lymphs Abs: 0.9 10*3/uL (ref 0.7–4.0)
MCH: 29.9 pg (ref 26.0–34.0)
MCHC: 33.1 g/dL (ref 30.0–36.0)
MCV: 90.3 fL (ref 80.0–100.0)
Monocytes Absolute: 0.9 10*3/uL (ref 0.1–1.0)
Monocytes Relative: 9 %
Neutro Abs: 8.2 10*3/uL — ABNORMAL HIGH (ref 1.7–7.7)
Neutrophils Relative %: 82 %
Platelets: 299 10*3/uL (ref 150–400)
RBC: 4.35 MIL/uL (ref 3.87–5.11)
RDW: 12.3 % (ref 11.5–15.5)
WBC: 10 10*3/uL (ref 4.0–10.5)
nRBC: 0 % (ref 0.0–0.2)

## 2019-04-17 LAB — URINALYSIS, ROUTINE W REFLEX MICROSCOPIC
Bilirubin Urine: NEGATIVE
Glucose, UA: NEGATIVE mg/dL
Ketones, ur: NEGATIVE mg/dL
Leukocytes,Ua: NEGATIVE
Nitrite: NEGATIVE
Protein, ur: NEGATIVE mg/dL
Specific Gravity, Urine: 1.027 (ref 1.005–1.030)
pH: 5 (ref 5.0–8.0)

## 2019-04-17 LAB — COMPREHENSIVE METABOLIC PANEL
ALT: 11 U/L (ref 0–44)
AST: 15 U/L (ref 15–41)
Albumin: 3.8 g/dL (ref 3.5–5.0)
Alkaline Phosphatase: 71 U/L (ref 38–126)
Anion gap: 6 (ref 5–15)
BUN: 14 mg/dL (ref 6–20)
CO2: 24 mmol/L (ref 22–32)
Calcium: 8.7 mg/dL — ABNORMAL LOW (ref 8.9–10.3)
Chloride: 108 mmol/L (ref 98–111)
Creatinine, Ser: 0.72 mg/dL (ref 0.44–1.00)
GFR calc Af Amer: >60 mL/min (ref >60)
GFR calc non Af Amer: >60 mL/min (ref >60)
Glucose, Bld: 118 mg/dL — ABNORMAL HIGH (ref 70–99)
Potassium: 3.5 mmol/L (ref 3.5–5.1)
Sodium: 138 mmol/L (ref 135–145)
Total Bilirubin: 0.5 mg/dL (ref 0.3–1.2)
Total Protein: 8 g/dL (ref 6.5–8.1)

## 2019-04-17 LAB — PREGNANCY, URINE: Preg Test, Ur: NEGATIVE

## 2019-04-17 LAB — LIPASE, BLOOD: Lipase: 31 U/L (ref 11–51)

## 2019-04-17 MED ORDER — HYDROMORPHONE HCL 1 MG/ML IJ SOLN
0.5000 mg | Freq: Once | INTRAMUSCULAR | Status: AC
  Administered 2019-04-17: 07:00:00 0.5 mg via INTRAVENOUS
  Filled 2019-04-17: qty 1

## 2019-04-17 MED ORDER — ONDANSETRON 4 MG PO TBDP: 4.0000 mg | ORAL_TABLET | ORAL | 0 refills | Status: DC | PRN

## 2019-04-17 MED ORDER — ONDANSETRON HCL 4 MG/2ML IJ SOLN
4.0000 mg | Freq: Once | INTRAMUSCULAR | Status: AC
  Administered 2019-04-17: 4 mg via INTRAVENOUS
  Filled 2019-04-17: qty 2

## 2019-04-17 MED ORDER — LOPERAMIDE HCL 2 MG PO CAPS: 2.0000 mg | ORAL_CAPSULE | Freq: Four times a day (QID) | ORAL | 0 refills | Status: DC | PRN

## 2019-04-17 MED ORDER — SODIUM CHLORIDE 0.9 % IV BOLUS
1000.0000 mL | Freq: Once | INTRAVENOUS | Status: AC
  Administered 2019-04-17: 1000 mL via INTRAVENOUS

## 2019-04-17 MED ORDER — IOHEXOL 300 MG/ML  SOLN
100.0000 mL | Freq: Once | INTRAMUSCULAR | Status: AC | PRN
  Administered 2019-04-17: 07:00:00 100 mL via INTRAVENOUS

## 2019-04-17 MED ORDER — HYDROMORPHONE HCL 1 MG/ML IJ SOLN
0.5000 mg | Freq: Once | INTRAMUSCULAR | Status: AC
  Administered 2019-04-17: 06:00:00 0.5 mg via INTRAVENOUS
  Filled 2019-04-17: qty 1

## 2019-04-17 MED ORDER — SODIUM CHLORIDE (PF) 0.9 % IJ SOLN
INTRAMUSCULAR | Status: AC
  Administered 2019-04-17: 07:00:00
  Filled 2019-04-17: qty 50

## 2019-04-17 NOTE — ED Provider Notes (Signed)
[facility] DEPT Provider Note   CSN: [id number] Arrival date & time: 04/17/19  0419    History   Chief Complaint Chief Complaint  Patient presents with  . Abdominal Pain    HPI Julie Ward is a 23 y.o. female.     HPI  This is a 23 year old female with a history of Crohn's disease, chronic abdominal pain, reflux who presents with abdominal pain.  Reports onset of symptoms this morning approximately 1 AM.  She states that she went to bed well.  She reports diffuse abdominal pain that now is mostly in the epigastrium.  Nothing seems to make it better or worse.  She did not take anything for her pain prior to this.  She reports multiple episodes of nausea and vomiting as well as nonbloody diarrhea.  Denies any recent fevers.  She rates her pain at 10 out of 10.  She denies chest pain, shortness of breath, urinary symptoms.  Last menstrual period was last week.  She is currently not taking any medications for her Crohn's disease.  Past Medical History:  Diagnosis Date  . Abdominal pain, recurrent   . Allergy   . Anxiety   . Appendicitis   . Crohn's disease of colon ([place]) 10/14   sees Dr. Blanca Friend at [place]   . Dysmenorrhea   . GERD (gastroesophageal reflux disease)    not taking any medication  . Shortness of breath dyspnea    ith thyroid problem    Patient Active Problem List   Diagnosis Date Noted  . Goiter, non-toxic 04/29/2015  . Vomiting 10/05/2014  . Hypokalemia 10/05/2014  . Elevated LFTs 10/05/2014  . Nausea and vomiting 10/04/2014  . Thyroid nodule 10/04/2014  . Abdominal pain 10/04/2014  . Low TSH level 07/18/2014  . Thyromegaly 06/06/2014  . Hives 03/08/2014  . Crohn's disease ([place]) 03/01/2014  . Heme + stool 08/28/2013  . Abdominal pain, other specified site 08/28/2013  . Change in bowel habits 08/28/2013  . Postop check 03/03/2012  . Generalized abdominal pain   . PITYRIASIS ROSEA 09/11/2010  . GERD  07/23/2010  . DYSMENORRHEA 07/23/2010  . BOILS, RECURRENT 10/22/2008    Past Surgical History:  Procedure Laterality Date  . APPENDECTOMY  07-14-11   laparoscopic   . CHOLECYSTECTOMY  02/11/2012   Procedure: LAPAROSCOPIC CHOLECYSTECTOMY WITH INTRAOPERATIVE CHOLANGIOGRAM;  Surgeon: Gwenyth Ober, MD;  Location: [facility];  Service: General;  Laterality: N/A;  . ESOPHAGOGASTRODUODENOSCOPY  01/28/2012   Procedure: ESOPHAGOGASTRODUODENOSCOPY (EGD);  Surgeon: Oletha Blend, MD;  Location: [facility];  Service: Gastroenterology;  Laterality: N/A;  . THYROID LOBECTOMY Left 04/29/2015   Procedure: LEFT TOTAL THYROID LOBECTOMY;  Surgeon: Judeth Horn, MD;  Location: [facility];  Service: General;  Laterality: Left;  . WISDOM TOOTH EXTRACTION       OB History    Gravida  0   Para      Term      Preterm      AB      Living        SAB      TAB      Ectopic      Multiple      Live Births               Home Medications    Prior to Admission medications   Medication Sig Start Date End Date Taking? Authorizing Provider  acetaminophen (TYLENOL) 500 MG tablet Take 1,000 mg by mouth  2 (two) times daily as needed for mild pain.    [provider]  esomeprazole (NEXIUM) 40 MG capsule Take 1 capsule (40 mg total) by mouth daily. 04/04/19   Martinique, Betty G, MD  ondansetron (ZOFRAN ODT) 4 MG disintegrating tablet Take 1 tablet (4 mg total) by mouth every 8 (eight) hours as needed for nausea or vomiting. 08/02/18   Henderly, Britni A, PA-C  potassium chloride SA (K-DUR,KLOR-CON) 20 MEQ tablet Take 1 tablet (20 mEq total) by mouth daily. 08/01/18   Petrucelli, Glynda Jaeger, PA-C  promethazine (PHENERGAN) 25 MG tablet Take 1 tablet (25 mg total) by mouth every 6 (six) hours as needed for nausea or vomiting. 08/01/18   Petrucelli, Aldona Bar R, PA-C  metoCLOPramide (REGLAN) 10 MG tablet Take 1 tablet (10 mg total) by mouth every 6 (six) hours. 01/19/12 01/20/12  Barbara Cower, MD    Family History  Family History  Problem Relation Age of Onset  . Cancer Paternal Aunt        ovarian, kidney  . Cancer Paternal Uncle        colon  . Colon cancer Paternal Uncle   . Cancer Maternal Grandmother        breast   . Hypertension Maternal Grandmother   . Irritable bowel syndrome Maternal Grandmother   . Heart disease Maternal Grandmother   . Hypertension Paternal Grandmother   . Hyperlipidemia Paternal Grandmother     Social History Social History   Tobacco Use  . Smoking status: Never Smoker  . Smokeless tobacco: Never Used  Substance Use Topics  . Alcohol use: Yes    Alcohol/week: 0.0 standard drinks    Comment: occ  . Drug use: No     Allergies   Penicillins; Fentanyl; Morphine and related; Oxycontin [oxycodone hcl]; Prednisone; and Valium [diazepam]   Review of Systems Review of Systems  Constitutional: Negative for fever.  Respiratory: Negative for shortness of breath.   Cardiovascular: Negative for chest pain.  Gastrointestinal: Positive for abdominal pain, diarrhea, nausea and vomiting.  Genitourinary: Negative for dysuria.  Musculoskeletal: Negative for back pain.  Neurological: Negative for weakness.  All other systems reviewed and are negative.    Physical Exam Updated Vital Signs BP (!) 140/92   Pulse 70   Temp 98.5 F (36.9 C) (Oral)   Resp 16   SpO2 99%   Physical Exam Vitals signs and nursing note reviewed.  Constitutional:      Appearance: She is well-developed. She is obese.  HENT:     Head: Normocephalic and atraumatic.     Mouth/Throat:     Mouth: Mucous membranes are moist.  Eyes:     Pupils: Pupils are equal, round, and reactive to light.  Neck:     Musculoskeletal: Neck supple.  Cardiovascular:     Rate and Rhythm: Normal rate and regular rhythm.     Heart sounds: Normal heart sounds.  Pulmonary:     Effort: Pulmonary effort is normal. No respiratory distress.     Breath sounds: No wheezing.  Abdominal:     General: Bowel  sounds are normal.     Palpations: Abdomen is soft.     Tenderness: There is generalized abdominal tenderness. There is no guarding or rebound.  Skin:    General: Skin is warm and dry.  Neurological:     Mental Status: She is alert and oriented to person, place, and time.  Psychiatric:        Mood and Affect: Mood normal.  ED Treatments / Results  Labs (all labs ordered are listed, but only abnormal results are displayed) Labs Reviewed  CBC WITH DIFFERENTIAL/PLATELET - Abnormal; Notable for the following components:      Result Value   Neutro Abs 8.2 (*)    All other components within normal limits  COMPREHENSIVE METABOLIC PANEL - Abnormal; Notable for the following components:   Glucose, Bld 118 (*)    Calcium 8.7 (*)    All other components within normal limits  URINALYSIS, ROUTINE W REFLEX MICROSCOPIC - Abnormal; Notable for the following components:   APPearance HAZY (*)    Hgb urine dipstick SMALL (*)    Bacteria, UA RARE (*)    All other components within normal limits  LIPASE, BLOOD  PREGNANCY, URINE    EKG None  Radiology No results found.  Procedures Procedures (including critical care time)  Medications Ordered in ED Medications  HYDROmorphone (DILAUDID) injection 0.5 mg (has no administration in time range)  sodium chloride 0.9 % bolus 1,000 mL (1,000 mLs Intravenous New Bag/Given 04/17/19 0533)  ondansetron (ZOFRAN) injection 4 mg (4 mg Intravenous Given 04/17/19 0531)  HYDROmorphone (DILAUDID) injection 0.5 mg (0.5 mg Intravenous Given 04/17/19 0531)     Initial Impression / Assessment and Plan / ED Course  I have reviewed the triage vital signs and the nursing notes.  Pertinent labs & imaging results that were available during my care of the patient were reviewed by me and considered in my medical decision making (see chart for details).  Clinical Course as of Apr 16 637  Tue Apr 17, 2019  0624 On recheck, patient reports some improvement of  pain.  Abdominal exam is reassuring.  Discussed with patient trying to avoid CT imaging given her history of Crohn's disease and multiple CTs in the past.  Patient is agreeable plan.  Patient p.o. challenge.   [CH]  [phone] She now states that she does not believe she can eat or drink because of persistent nausea and now worsening pain.  She was redosed pain medication.  Will obtain CT.   [CH]    Clinical Course User Index [CH] Faithlynn Deeley, Barbette Hair, MD       She presents with abdominal pain.  History of Crohn's disease but also history of chronic pain.  She is overall nontoxic-appearing vital signs are reassuring.  She has diffuse tenderness on exam but no point tenderness or signs of peritonitis.  Work-up initiated.  No leukocytosis.  No significant metabolic derangements or LFT abnormalities.  Urinalysis is reassuring.  See clinical course above.  Patient had recurrence of pain.  Given history, will obtain CT scan.  Last CT scan was in 2019.  Final Clinical Impressions(s) / ED Diagnoses   Final diagnoses:  None    ED Discharge Orders    None       Rolena Knutson, Barbette Hair, MD 04/17/19 [phone]

## 2019-04-17 NOTE — ED Provider Notes (Signed)
Follow up on CT scan.  History of Crohn's.  Anticipate DC if CT within normal limits. Physical Exam  BP (!) 140/92   Pulse 70   Temp 98.5 F (36.9 C) (Oral)   Resp 16   SpO2 99%   Physical Exam Constitutional:      Comments: Patient is alert and in no distress.  She sitting up on the stretcher.  Mental status is clear.  No respiratory distress.     ED Course/Procedures   Clinical Course as of Apr 16 702  Tue Apr 17, 2019  0624 On recheck, patient reports some improvement of pain.  Abdominal exam is reassuring.  Discussed with patient trying to avoid CT imaging given her history of Crohn's disease and multiple CTs in the past.  Patient is agreeable plan.  Patient p.o. challenge.   [CH]  [phone] She now states that she does not believe she can eat or drink because of persistent nausea and now worsening pain.  She was redosed pain medication.  Will obtain CT.   [CH]    Clinical Course User Index [CH] Horton, Barbette Hair, MD    Procedures  MDM   Results reviewed with the patient.  She reports that now her girlfriend had eaten with her at a [place] last night is also starting to get some symptoms of GI upset.  She wonders if they have gotten food poisoning.  I do suspect gastroenteritis either food or viral source most likely given the symptoms and normal diagnostic evaluation.  Patient is clinically well in appearance.  She is now requesting some Sprite to start sipping.  Also done using Zofran and Imodium with small frequent fluid intake.  Patient understands plan and return precautions.     Charlesetta Shanks, MD 04/17/19 [phone]

## 2019-04-17 NOTE — ED Triage Notes (Signed)
Pt from home c/o abd  Pain onset 1.5 hours ago no attempt at home intervention. IV established per EMS medicated with @ 5 mg of 20 mg dose Ketamine pt requested medication stopped pain resolved.

## 2019-04-17 NOTE — Discharge Instructions (Addendum)
1.  Take Zofran as prescribed for nausea and vomiting.  Take Imodium if needed for diarrheal stool. 2.  Keep fluids at your bedside.  Drink several ounces at a time every 20 to 30 minutes. 3.  Return to the emergency department if you feel that you are weak, dizzy, developed fever, cannot keep any fluids down or other concerning symptoms.

## 2019-04-17 NOTE — ED Notes (Signed)
Bed: TC76 Expected date:  Expected time:  Means of arrival:  Comments: 23 yo F/abd pain

## 2019-04-17 NOTE — ED Notes (Signed)
Pt said she is unable to drink or eat at this time because it will "make me throw up." Provider made aware.

## 2019-05-11 ENCOUNTER — Ambulatory Visit (INDEPENDENT_AMBULATORY_CARE_PROVIDER_SITE_OTHER): Payer: 59 | Admitting: Family Medicine

## 2019-05-11 ENCOUNTER — Encounter: Payer: Self-pay | Admitting: Family Medicine

## 2019-05-11 ENCOUNTER — Other Ambulatory Visit: Payer: Self-pay

## 2019-05-11 VITALS — BP 110/70 | Temp 98.7°F | Wt 256.1 lb

## 2019-05-11 DIAGNOSIS — N644 Mastodynia: Secondary | ICD-10-CM

## 2019-05-11 NOTE — Progress Notes (Signed)
   Subjective:    Patient ID: Julie Ward, female    DOB: February 21, 1996, 23 y.o.   MRN: [id number]  HPI Here for one month of tenderness in both breasts, the left side more so. The left breast feels warm to her, but there has been no redness and the skin is clear. No nipple DC. No fever. Her LMP was on May 27 to May 28. She is always regular with her menses, and it lasts 2-3 days. She has been actively trying to get pregnant since March. She admits to drinking caffeinated sodas "all day".   Review of Systems  Constitutional: Negative.   Respiratory: Negative.   Cardiovascular: Negative.   Gastrointestinal: Negative.        Objective:   Physical Exam Constitutional:      Appearance: Normal appearance.  Cardiovascular:     Rate and Rhythm: Normal rate and regular rhythm.     Pulses: Normal pulses.     Heart sounds: Normal heart sounds.  Pulmonary:     Effort: Pulmonary effort is normal.     Breath sounds: Normal breath sounds.  Genitourinary:    Comments: The right breast is normal on exam, no tenderness is noted. The left breast is mildly tender and has multiple small nodules scattered throughout, no dominant masses. The left axills is clear. No skin changes or redness or warmth or nipple DC is seen on either side.  Neurological:     Mental Status: She is alert.           Assessment & Plan:  Breast tenderness, especially on the left side. This is more pronounced just before her menses start. This is most likely fibrocystic breast disease, and we discussed this today. I suggested she reduce her caffeine intake. We will set up an US of the left breast to evaluate further.  Alysia Penna, MD

## 2019-05-23 ENCOUNTER — Ambulatory Visit
Admission: RE | Admit: 2019-05-23 | Discharge: 2019-05-23 | Disposition: A | Discharge status: Home / Self Care | Payer: 59 | Source: Ambulatory Visit | Attending: Family Medicine | Admitting: Family Medicine

## 2019-05-23 DIAGNOSIS — N644 Mastodynia: Secondary | ICD-10-CM

## 2019-09-19 ENCOUNTER — Ambulatory Visit (INDEPENDENT_AMBULATORY_CARE_PROVIDER_SITE_OTHER): Payer: 59 | Admitting: Family Medicine

## 2019-09-19 ENCOUNTER — Other Ambulatory Visit: Payer: Self-pay

## 2019-09-19 ENCOUNTER — Encounter: Payer: Self-pay | Admitting: Family Medicine

## 2019-09-19 VITALS — BP 120/80 | HR 64 | Temp 97.3°F | Ht 68.0 in | Wt 266.8 lb

## 2019-09-19 DIAGNOSIS — L723 Sebaceous cyst: Secondary | ICD-10-CM

## 2019-09-19 NOTE — Progress Notes (Signed)
   Subjective:    Patient ID: Julie Ward, female    DOB: 1996-09-12, 23 y.o.   MRN: [id number]  HPI Here to check a small lump she found under the skin of her abdomen 2 weeks ago. At first it was larger and was quite tender, but now it has gotten smaller and there is no more discomfort.    Review of Systems  Constitutional: Negative.   Respiratory: Negative.   Cardiovascular: Negative.   Gastrointestinal: Positive for abdominal pain. Negative for abdominal distention, anal bleeding, blood in stool, constipation, diarrhea, nausea and vomiting.       Objective:   Physical Exam Constitutional:      Appearance: Normal appearance.  Cardiovascular:     Rate and Rhythm: Normal rate and regular rhythm.     Pulses: Normal pulses.     Heart sounds: Normal heart sounds.  Pulmonary:     Effort: Pulmonary effort is normal.     Breath sounds: Normal breath sounds.  Abdominal:     General: Abdomen is flat. Bowel sounds are normal. There is no distension.     Palpations: Abdomen is soft.     Tenderness: There is no abdominal tenderness. There is no guarding or rebound.     Hernia: No hernia is present.     Comments: There is a 0.5 cm non-tender mobile firm lump just under the skin in the suprapubic area   Neurological:     Mental Status: She is alert.           Assessment & Plan:  This is most likely a sebaceous cyst that was inflamed a few weeks ago. The inflammation has resolved. We will simply observe this for now.  Alysia Penna, MD

## 2020-03-04 ENCOUNTER — Telehealth (INDEPENDENT_AMBULATORY_CARE_PROVIDER_SITE_OTHER): Payer: 59 | Admitting: Family Medicine

## 2020-03-04 ENCOUNTER — Encounter: Payer: Self-pay | Admitting: Family Medicine

## 2020-03-04 DIAGNOSIS — F419 Anxiety disorder, unspecified: Secondary | ICD-10-CM

## 2020-03-04 MED ORDER — ESCITALOPRAM OXALATE 10 MG PO TABS: 10.0000 mg | ORAL_TABLET | Freq: Every day | ORAL | 2 refills | Status: DC

## 2020-03-04 NOTE — Progress Notes (Signed)
Subjective:    Patient ID: Julie Ward, female    DOB: November 07, 1996, 24 y.o.   MRN: [id number]  HPI Virtual Visit via Video Note  I connected with the patient on 03/04/20 at  3:45 PM EDT by a video enabled telemedicine application and verified that I am speaking with the correct person using two identifiers.  Location patient: home Location provider:work or home office Persons participating in the virtual visit: patient, provider  I discussed the limitations of evaluation and management by telemedicine and the availability of in person appointments. The patient expressed understanding and agreed to proceed.   HPI: Here asking for help with anxiety. She got a new job 2 months ago and this is very stressful for her. She hates this job but she is trying to stick it out for now. She worries about things and she cannot eat. She does not sleep well. Her hands shake and she feels sweaty.    ROS: See pertinent positives and negatives per HPI.  Past Medical History:  Diagnosis Date  . Abdominal pain, recurrent   . Allergy   . Anxiety   . Appendicitis   . Crohn's disease of colon ([place]) 10/14   sees Dr. Blanca Friend at [facility]   . Dysmenorrhea   . GERD (gastroesophageal reflux disease)    not taking any medication  . Shortness of breath dyspnea    ith thyroid problem    Past Surgical History:  Procedure Laterality Date  . APPENDECTOMY  07-14-11   laparoscopic   . CHOLECYSTECTOMY  02/11/2012   Procedure: LAPAROSCOPIC CHOLECYSTECTOMY WITH INTRAOPERATIVE CHOLANGIOGRAM;  Surgeon: Gwenyth Ober, MD;  Location: [facility];  Service: General;  Laterality: N/A;  . ESOPHAGOGASTRODUODENOSCOPY  01/28/2012   Procedure: ESOPHAGOGASTRODUODENOSCOPY (EGD);  Surgeon: Oletha Blend, MD;  Location: [facility];  Service: Gastroenterology;  Laterality: N/A;  . THYROID LOBECTOMY Left 04/29/2015   Procedure: LEFT TOTAL THYROID LOBECTOMY;  Surgeon: Judeth Horn, MD;  Location: [facility];  Service: General;   Laterality: Left;  . WISDOM TOOTH EXTRACTION      Family History  Problem Relation Age of Onset  . Cancer Paternal Aunt        ovarian, kidney  . Cancer Paternal Uncle        colon  . Colon cancer Paternal Uncle   . Cancer Maternal Grandmother        breast   . Hypertension Maternal Grandmother   . Irritable bowel syndrome Maternal Grandmother   . Heart disease Maternal Grandmother   . Hypertension Paternal Grandmother   . Hyperlipidemia Paternal Grandmother     No current outpatient medications on file.  EXAM:  VITALS per patient if applicable:  GENERAL: alert, oriented, appears well and in no acute distress  HEENT: atraumatic, conjunttiva clear, no obvious abnormalities on inspection of external nose and ears  NECK: normal movements of the head and neck  LUNGS: on inspection no signs of respiratory distress, breathing rate appears normal, no obvious gross SOB, gasping or wheezing  CV: no obvious cyanosis  MS: moves all visible extremities without noticeable abnormality  PSYCH/NEURO: pleasant and cooperative, no obvious depression or anxiety, speech and thought processing grossly intact  ASSESSMENT AND PLAN: Anxiety, treat with Lexapro 10 mg daily. Recheck in 3-4 weeks. We will write her out of work from 03-14-20 until 03-31-20.  Alysia Penna, MD  Discussed the following assessment and plan:  No diagnosis found.     I discussed the assessment and treatment  plan with the patient. The patient was provided an opportunity to ask questions and all were answered. The patient agreed with the plan and demonstrated an understanding of the instructions.   The patient was advised to call back or seek an in-person evaluation if the symptoms worsen or if the condition fails to improve as anticipated.     Review of Systems     Objective:   Physical Exam        Assessment & Plan:

## 2020-04-07 ENCOUNTER — Other Ambulatory Visit: Payer: Self-pay | Admitting: Family Medicine

## 2020-05-01 ENCOUNTER — Encounter: Payer: Self-pay | Admitting: Family Medicine

## 2020-05-01 ENCOUNTER — Other Ambulatory Visit: Payer: Self-pay

## 2020-05-01 ENCOUNTER — Ambulatory Visit (INDEPENDENT_AMBULATORY_CARE_PROVIDER_SITE_OTHER): Payer: 59 | Admitting: Family Medicine

## 2020-05-01 ENCOUNTER — Emergency Department (HOSPITAL_COMMUNITY)
Admission: EM | Admit: 2020-05-01 | Discharge: 2020-05-01 | Disposition: A | Discharge status: Home / Self Care | Payer: 59 | Attending: Emergency Medicine | Admitting: Emergency Medicine

## 2020-05-01 ENCOUNTER — Encounter (HOSPITAL_COMMUNITY): Payer: Self-pay | Admitting: Emergency Medicine

## 2020-05-01 VITALS — BP 130/70 | Temp 97.7°F | Wt 273.0 lb

## 2020-05-01 DIAGNOSIS — R519 Headache, unspecified: Secondary | ICD-10-CM | POA: Insufficient documentation

## 2020-05-01 DIAGNOSIS — G4452 New daily persistent headache (NDPH): Secondary | ICD-10-CM

## 2020-05-01 DIAGNOSIS — Z5321 Procedure and treatment not carried out due to patient leaving prior to being seen by health care provider: Secondary | ICD-10-CM | POA: Insufficient documentation

## 2020-05-01 DIAGNOSIS — H538 Other visual disturbances: Secondary | ICD-10-CM | POA: Insufficient documentation

## 2020-05-01 NOTE — ED Triage Notes (Signed)
Per pt, states she has been having headaches in the back of head, states some blurred vision at times-states PCP told her it could be a tension H/A or something else-wanted her to get a scan but cant get it done till next week-patient cant wait so she came to ED

## 2020-05-01 NOTE — Progress Notes (Signed)
   Subjective:    Patient ID: Julie Ward, female    DOB: 04-30-1996, 24 y.o.   MRN: [id number]  HPI Here for 3 days of a continuous headache at the back of the head. No recent trauma. No fever or URI symptoms. No recent tick bites. The pain is annoying but not severe. It waxes and wanes but never stops. No other neurologic deficits. No nausea or light sensitivity. Julie Ward rarely gets headaches. Julie Ward has not taken anything for it because Julie Ward tries to avoid medications whenever possible.    Review of Systems  Constitutional: Negative.   Eyes: Negative.  Negative for photophobia.  Respiratory: Negative.   Cardiovascular: Negative.   Neurological: Positive for headaches. Negative for dizziness, tremors, seizures, syncope, facial asymmetry, speech difficulty, weakness, light-headedness and numbness.       Objective:   Physical Exam Constitutional:      General: Julie Ward is not in acute distress.    Appearance: Normal appearance. Julie Ward is well-developed.  Eyes:     General: No scleral icterus.    Extraocular Movements: Extraocular movements intact.     Pupils: Pupils are equal, round, and reactive to light. Pupils are equal.     Comments: No photophobia   Cardiovascular:     Rate and Rhythm: Normal rate and regular rhythm.     Pulses: Normal pulses.     Heart sounds: Normal heart sounds.  Pulmonary:     Effort: Pulmonary effort is normal.     Breath sounds: Normal breath sounds.  Musculoskeletal:     Cervical back: Normal range of motion and neck supple. No rigidity or tenderness.  Lymphadenopathy:     Cervical: No cervical adenopathy.  Neurological:     General: No focal deficit present.     Mental Status: Julie Ward is alert and oriented to person, place, and time.     Cranial Nerves: No cranial nerve deficit.     Motor: No weakness.     Coordination: Coordination normal.     Gait: Gait normal.           Assessment & Plan:  Occipital headache. This is most likely a tension headache,  but we will set up a head CT soon to evaluate. If this is normal we may consider treatment options like massage or PT.  Alysia Penna, MD

## 2020-05-13 ENCOUNTER — Ambulatory Visit (INDEPENDENT_AMBULATORY_CARE_PROVIDER_SITE_OTHER): Payer: 59 | Admitting: Family Medicine

## 2020-05-13 ENCOUNTER — Encounter: Payer: Self-pay | Admitting: Family Medicine

## 2020-05-13 ENCOUNTER — Other Ambulatory Visit: Payer: Self-pay

## 2020-05-13 VITALS — BP 126/62 | Temp 98.2°F

## 2020-05-13 DIAGNOSIS — G44221 Chronic tension-type headache, intractable: Secondary | ICD-10-CM | POA: Diagnosis not present

## 2020-05-13 NOTE — Progress Notes (Signed)
   Subjective:    Patient ID: Julie Ward, female    DOB: 01-Jun-1996, 24 y.o.   MRN: [id number]  HPI Here to follow up on a constant headache that started about 3 weeks ago. This began in the back of the head, but now it has shifted to the forehead and both temples. She went to a [facility] ER on 05-01-20 and a head CT was obtained. This was completely normal. They gave her an IV with Toradol, Benadryl, and Reglan. This helped quite a bit, but she has had a very low level headache ever since. No other symptoms. She take san occasional Tylenol but she is very hesitant to take any type of medication. She is back to her full time job.    Review of Systems  Constitutional: Negative.   Respiratory: Negative.   Cardiovascular: Negative.   Neurological: Positive for headaches. Negative for dizziness, tremors, seizures, syncope, facial asymmetry, speech difficulty, weakness, light-headedness and numbness.       Objective:   Physical Exam Constitutional:      Appearance: Normal appearance.  Cardiovascular:     Rate and Rhythm: Normal rate and regular rhythm.     Pulses: Normal pulses.     Heart sounds: Normal heart sounds.  Pulmonary:     Effort: Pulmonary effort is normal.     Breath sounds: Normal breath sounds.  Neurological:     General: No focal deficit present.     Mental Status: She is alert and oriented to person, place, and time.     Cranial Nerves: No cranial nerve deficit.     Motor: No weakness.     Coordination: Coordination normal.     Gait: Gait normal.           Assessment & Plan:  This is consistent with a tension headache. Since she is reluctant to try most medications, she agreed to stick with Tylenol. I told her she would get the best results by taking 1000 mg of Tylenol every 6-8 hours on a regular basis for a few days. Recheck if this is not successful.  Alysia Penna, MD

## 2020-06-24 ENCOUNTER — Telehealth (INDEPENDENT_AMBULATORY_CARE_PROVIDER_SITE_OTHER): Payer: 59 | Admitting: Family Medicine

## 2020-06-24 ENCOUNTER — Encounter: Payer: Self-pay | Admitting: Family Medicine

## 2020-06-24 DIAGNOSIS — L509 Urticaria, unspecified: Secondary | ICD-10-CM | POA: Diagnosis not present

## 2020-06-24 NOTE — Progress Notes (Signed)
Subjective:    Patient ID: Julie Ward, female    DOB: 1996/07/21, 24 y.o.   MRN: [id number]  HPI Virtual Visit via Video Note  I connected with the patient on 06/24/20 at 10:15 AM EDT by a video enabled telemedicine application and verified that I am speaking with the correct person using two identifiers.  Location patient: home Location provider:work or home office Persons participating in the virtual visit: patient, provider  I discussed the limitations of evaluation and management by telemedicine and the availability of in person appointments. The patient expressed understanding and agreed to proceed.   HPI: Here for 3 days of an itchy rash on the face. No other areas of her body are affected. The rash comes and goes. She feels fine in general. She denies any recent changes in makeup, soap, detergents, etc. As we speak there are no lesions on her face, but she shows me a photo she took last night which reveals several red papular areas on her face.    ROS: See pertinent positives and negatives per HPI.  Past Medical History:  Diagnosis Date  . Abdominal pain, recurrent   . Allergy   . Anxiety   . Appendicitis   . Crohn's disease of colon ([place]) 10/14   sees Dr. Blanca Friend at [facility]   . Dysmenorrhea   . GERD (gastroesophageal reflux disease)    not taking any medication  . Shortness of breath dyspnea    ith thyroid problem    Past Surgical History:  Procedure Laterality Date  . APPENDECTOMY  07-14-11   laparoscopic   . CHOLECYSTECTOMY  02/11/2012   Procedure: LAPAROSCOPIC CHOLECYSTECTOMY WITH INTRAOPERATIVE CHOLANGIOGRAM;  Surgeon: Gwenyth Ober, MD;  Location: [facility];  Service: General;  Laterality: N/A;  . ESOPHAGOGASTRODUODENOSCOPY  01/28/2012   Procedure: ESOPHAGOGASTRODUODENOSCOPY (EGD);  Surgeon: Oletha Blend, MD;  Location: [facility];  Service: Gastroenterology;  Laterality: N/A;  . THYROID LOBECTOMY Left 04/29/2015   Procedure: LEFT TOTAL THYROID  LOBECTOMY;  Surgeon: Judeth Horn, MD;  Location: [facility];  Service: General;  Laterality: Left;  . WISDOM TOOTH EXTRACTION      Family History  Problem Relation Age of Onset  . Cancer Paternal Aunt        ovarian, kidney  . Cancer Paternal Uncle        colon  . Colon cancer Paternal Uncle   . Cancer Maternal Grandmother        breast   . Hypertension Maternal Grandmother   . Irritable bowel syndrome Maternal Grandmother   . Heart disease Maternal Grandmother   . Hypertension Paternal Grandmother   . Hyperlipidemia Paternal Grandmother      Current Outpatient Medications:  .  escitalopram (LEXAPRO) 10 MG tablet, Take 1 tablet (10 mg total) by mouth daily., Disp: 30 tablet, Rfl: 2  EXAM:  VITALS per patient if applicable:  GENERAL: alert, oriented, appears well and in no acute distress  HEENT: atraumatic, conjunttiva clear, no obvious abnormalities on inspection of external nose and ears  NECK: normal movements of the head and neck  LUNGS: on inspection no signs of respiratory distress, breathing rate appears normal, no obvious gross SOB, gasping or wheezing  CV: no obvious cyanosis  MS: moves all visible extremities without noticeable abnormality  PSYCH/NEURO: pleasant and cooperative, no obvious depression or anxiety, speech and thought processing grossly intact  ASSESSMENT AND PLAN: This is hives. She wants to avoid any oral medications if possible, so she will  apply Benadryl cream QID to the face. Recheck prn. Alysia Penna, MD    Discussed the following assessment and plan:  No diagnosis found.     I discussed the assessment and treatment plan with the patient. The patient was provided an opportunity to ask questions and all were answered. The patient agreed with the plan and demonstrated an understanding of the instructions.   The patient was advised to call back or seek an in-person evaluation if the symptoms worsen or if the condition fails to improve as  anticipated.     Review of Systems     Objective:   Physical Exam        Assessment & Plan:

## 2020-09-29 ENCOUNTER — Encounter: Payer: Self-pay | Admitting: Family Medicine

## 2020-09-29 ENCOUNTER — Other Ambulatory Visit: Payer: Self-pay

## 2020-09-29 ENCOUNTER — Ambulatory Visit (INDEPENDENT_AMBULATORY_CARE_PROVIDER_SITE_OTHER): Payer: 59 | Admitting: Family Medicine

## 2020-09-29 VITALS — BP 124/76 | HR 92 | Ht 68.0 in | Wt 271.4 lb

## 2020-09-29 DIAGNOSIS — L309 Dermatitis, unspecified: Secondary | ICD-10-CM | POA: Insufficient documentation

## 2020-09-29 MED ORDER — TRIAMCINOLONE ACETONIDE 0.1 % EX CREA: 1.0000 "application " | TOPICAL_CREAM | Freq: Two times a day (BID) | CUTANEOUS | 2 refills | Status: DC

## 2020-09-29 NOTE — Progress Notes (Signed)
   Subjective:    Patient ID: Julie Ward, female    DOB: 08/14/1996, 24 y.o.   MRN: [id number]  HPI Here for several spots on her skin that started 4 weeks ago. They itch slightly. The first one appeared on the right arm, then 2 more appeared on her abdomen. She has been applying moisturizer cream. She notes that she had eczema as a child.    Review of Systems  Respiratory: Negative.   Cardiovascular: Negative.   Skin: Positive for rash.       Objective:   Physical Exam Constitutional:      Appearance: Normal appearance.  Cardiovascular:     Rate and Rhythm: Normal rate and regular rhythm.     Pulses: Normal pulses.     Heart sounds: Normal heart sounds.  Pulmonary:     Effort: Pulmonary effort is normal.     Breath sounds: Normal breath sounds.  Skin:    Comments: There are 3 areas of macular erythema with some scaling. One on the right upper arm and 2 on the left lower abdomen. The largest is 1.5 cm in diameter   Neurological:     Mental Status: She is alert.           Assessment & Plan:  Eczema, treat with Triamcinolone cream.  Alysia Penna, MD

## 2020-11-19 ENCOUNTER — Ambulatory Visit: Payer: BC Managed Care – PPO | Admitting: Family Medicine

## 2020-11-19 ENCOUNTER — Encounter: Payer: Self-pay | Admitting: Family Medicine

## 2020-11-19 ENCOUNTER — Other Ambulatory Visit: Payer: Self-pay

## 2020-11-19 VITALS — BP 112/78 | HR 68 | Temp 99.0°F | Ht 68.0 in | Wt 264.8 lb

## 2020-11-19 DIAGNOSIS — L309 Dermatitis, unspecified: Secondary | ICD-10-CM | POA: Diagnosis not present

## 2020-11-19 NOTE — Progress Notes (Signed)
   Subjective:    Patient ID: Julie Ward, female    DOB: 12/30/1995, 25 y.o.   MRN: [id number]  HPI Here for itchy spots on her skin that started about 4 months ago. She has been applying Triamcinolone cream to these areas BID with no improvement.    Review of Systems  Constitutional: Negative.   Respiratory: Negative.   Cardiovascular: Negative.   Skin: Positive for rash.       Objective:   Physical Exam Constitutional:      Appearance: Normal appearance.  Cardiovascular:     Rate and Rhythm: Normal rate and regular rhythm.     Pulses: Normal pulses.     Heart sounds: Normal heart sounds.  Pulmonary:     Effort: Pulmonary effort is normal.     Breath sounds: Normal breath sounds.  Skin:    Comments: Scattered small scaly hyperpigmented spots on the trunk   Neurological:     Mental Status: She is alert.           Assessment & Plan:  Dermatitis, refer to Dermatology.  Alysia Penna, MD

## 2020-12-17 DIAGNOSIS — D485 Neoplasm of uncertain behavior of skin: Secondary | ICD-10-CM | POA: Diagnosis not present

## 2020-12-17 DIAGNOSIS — L259 Unspecified contact dermatitis, unspecified cause: Secondary | ICD-10-CM | POA: Diagnosis not present

## 2020-12-17 DIAGNOSIS — L42 Pityriasis rosea: Secondary | ICD-10-CM | POA: Diagnosis not present

## 2020-12-29 DIAGNOSIS — L42 Pityriasis rosea: Secondary | ICD-10-CM | POA: Diagnosis not present

## 2021-05-13 ENCOUNTER — Encounter: Payer: Self-pay | Admitting: Family Medicine

## 2021-05-13 ENCOUNTER — Telehealth (INDEPENDENT_AMBULATORY_CARE_PROVIDER_SITE_OTHER): Payer: BC Managed Care – PPO | Admitting: Family Medicine

## 2021-05-13 DIAGNOSIS — M25562 Pain in left knee: Secondary | ICD-10-CM

## 2021-05-13 DIAGNOSIS — M545 Low back pain, unspecified: Secondary | ICD-10-CM | POA: Diagnosis not present

## 2021-05-13 DIAGNOSIS — M25561 Pain in right knee: Secondary | ICD-10-CM | POA: Diagnosis not present

## 2021-05-13 MED ORDER — NAPROXEN 500 MG PO TABS: 500.0000 mg | ORAL_TABLET | Freq: Two times a day (BID) | ORAL | 0 refills | Status: DC

## 2021-05-13 NOTE — Progress Notes (Signed)
Subjective:    Patient ID: Julie Ward, female    DOB: 20-Jun-1996, 25 y.o.   MRN: [id number]  HPI Here for one week of stiffness and pain in the lower back and in both knees. She has never had this type of pain before. No recent trauma. It is very difficult for her to go up or down stairs. She has tried Tylenol and heat with mixed results. She cannot take Prednisone due to an allergic reaction, and she tried to avoid NSAIDs because of her Crohn's disease.  Virtual Visit via Video Note  I connected with the patient on 05/13/21 at  3:45 PM EDT by a video enabled telemedicine application and verified that I am speaking with the correct person using two identifiers.  Location patient: home Location provider:work or home office Persons participating in the virtual visit: patient, provider  I discussed the limitations of evaluation and management by telemedicine and the availability of in person appointments. The patient expressed understanding and agreed to proceed.   HPI:    ROS: See pertinent positives and negatives per HPI.  Past Medical History:  Diagnosis Date   Abdominal pain, recurrent    Allergy    Anxiety    Appendicitis    Crohn's disease of colon ([place]) 10/14   sees Dr. Blanca Friend at [facility]    Dysmenorrhea    GERD (gastroesophageal reflux disease)    not taking any medication   Shortness of breath dyspnea    ith thyroid problem    Past Surgical History:  Procedure Laterality Date   APPENDECTOMY  07-14-11   laparoscopic    CHOLECYSTECTOMY  02/11/2012   Procedure: LAPAROSCOPIC CHOLECYSTECTOMY WITH INTRAOPERATIVE CHOLANGIOGRAM;  Surgeon: Gwenyth Ober, MD;  Location: [facility];  Service: General;  Laterality: N/A;   ESOPHAGOGASTRODUODENOSCOPY  01/28/2012   Procedure: ESOPHAGOGASTRODUODENOSCOPY (EGD);  Surgeon: Oletha Blend, MD;  Location: [facility];  Service: Gastroenterology;  Laterality: N/A;   THYROID LOBECTOMY Left 04/29/2015   Procedure: LEFT TOTAL THYROID  LOBECTOMY;  Surgeon: Judeth Horn, MD;  Location: [facility];  Service: General;  Laterality: Left;   WISDOM TOOTH EXTRACTION      Family History  Problem Relation Age of Onset   Cancer Paternal Aunt        ovarian, kidney   Cancer Paternal Uncle        colon   Colon cancer Paternal Uncle    Cancer Maternal Grandmother        breast    Hypertension Maternal Grandmother    Irritable bowel syndrome Maternal Grandmother    Heart disease Maternal Grandmother    Hypertension Paternal Grandmother    Hyperlipidemia Paternal Grandmother      Current Outpatient Medications:    escitalopram (LEXAPRO) 10 MG tablet, Take 1 tablet (10 mg total) by mouth daily., Disp: 30 tablet, Rfl: 2   naproxen (NAPROSYN) 500 MG tablet, Take 1 tablet (500 mg total) by mouth 2 (two) times daily with a meal., Disp: 30 tablet, Rfl: 0   triamcinolone cream (KENALOG) 0.1 %, Apply 1 application topically 2 (two) times daily., Disp: 30 g, Rfl: 2  EXAM:  VITALS per patient if applicable:  GENERAL: alert, oriented, appears well and in no acute distress  HEENT: atraumatic, conjunttiva clear, no obvious abnormalities on inspection of external nose and ears  NECK: normal movements of the head and neck  LUNGS: on inspection no signs of respiratory distress, breathing rate appears normal, no obvious gross SOB, gasping or  wheezing  CV: no obvious cyanosis  MS: moves all visible extremities without noticeable abnormality  PSYCH/NEURO: pleasant and cooperative, no obvious depression or anxiety, speech and thought processing grossly intact  ASSESSMENT AND PLAN: Back and knee pain of uncertain etiology. She will take Naproxen 500 mg BID for one week, and then she will report back to [place].  Alysia Penna, MD  Discussed the following assessment and plan:  No diagnosis found.     I discussed the assessment and treatment plan with the patient. The patient was provided an opportunity to ask questions and all were answered.  The patient agreed with the plan and demonstrated an understanding of the instructions.   The patient was advised to call back or seek an in-person evaluation if the symptoms worsen or if the condition fails to improve as anticipated.     Review of Systems     Objective:   Physical Exam        Assessment & Plan:

## 2021-07-31 DIAGNOSIS — F320 Major depressive disorder, single episode, mild: Secondary | ICD-10-CM | POA: Diagnosis not present

## 2021-08-07 DIAGNOSIS — F320 Major depressive disorder, single episode, mild: Secondary | ICD-10-CM | POA: Diagnosis not present

## 2021-08-13 DIAGNOSIS — F3181 Bipolar II disorder: Secondary | ICD-10-CM | POA: Diagnosis not present

## 2021-08-19 DIAGNOSIS — F320 Major depressive disorder, single episode, mild: Secondary | ICD-10-CM | POA: Diagnosis not present

## 2021-08-21 DIAGNOSIS — H4710 Unspecified papilledema: Secondary | ICD-10-CM | POA: Diagnosis not present

## 2021-08-25 DIAGNOSIS — F320 Major depressive disorder, single episode, mild: Secondary | ICD-10-CM | POA: Diagnosis not present

## 2021-08-27 ENCOUNTER — Other Ambulatory Visit: Payer: Self-pay

## 2021-08-27 ENCOUNTER — Encounter: Payer: Self-pay | Admitting: Neurology

## 2021-08-27 ENCOUNTER — Ambulatory Visit: Payer: BC Managed Care – PPO | Admitting: Neurology

## 2021-08-27 VITALS — BP 129/81 | HR 88 | Ht 67.0 in | Wt 251.0 lb

## 2021-08-27 DIAGNOSIS — H4710 Unspecified papilledema: Secondary | ICD-10-CM | POA: Diagnosis not present

## 2021-08-27 DIAGNOSIS — F40240 Claustrophobia: Secondary | ICD-10-CM | POA: Diagnosis not present

## 2021-08-27 NOTE — Progress Notes (Addendum)
[facility]  PATIENT: KENNETH LAX DOB: 06-Jun-1996  REFERRING DOCTOR OR PCP: Richmond Campbell, MD (ophthalmology); Alysia Penna, MD (PCP) SOURCE: Patient, notes from ophthalmology, CT scan report  _________________________________   HISTORICAL  CHIEF COMPLAINT:  Chief Complaint  Patient presents with   New Patient (Initial Visit)    New room, w/ mother. Paper referral from [facility], MD for bilateral optic disc edema. Here to r/I IIH. No sx currently. Last year, was having severe headaches and had CT but CT ok per pt. CT report in care everywhere/done at [facility] 04/2020    HISTORY OF PRESENT ILLNESS:  I had the pleasure of seeing your patient, Madiline Saffran, at [facility] for neurologic consultation regarding her optic disc edema and concern about idiopathic intracranial hypertension.  She is a 25 year old woman who was found on routine eye exam to have mild optic nerve edema bilaterally.    She saw Dr. Gladys Damme for a routine eye exam (telemedicine with a technician live with the patient) who noted optic disc edema on routine eye exam.  OCT RNFL confirmed optic disc edema and no evidence of drusen.  She saw her ophthalmology 08/21/2021.  She was referred to Dr. Tobe Sos who confirmed the bilateral optic nerve edema, grade 1.   Visual acuity was 20/20.  Intraocular pressures were normal (16 OD and OS).  She denies any visual field issues.   She denies eye pain or frequent headache.  She denies pulsatile tinnitus.   Rarely, she feels lightheaded if she stands up fast.   No diplopia.     She is referred for further evaluation and treatment.  She used to weight 277 and no weighs 255.      She gets occasional mild headaches that resolve on their own.  She presented to the [facility] emergency room 05/01/2020 due to a severe headache lasting 4 days.  She had a CT scan at [facility].  05/01/2020.  It was read as showing no acute intracranial  abnormality.  The pituitary is not mentioned on the report.      REVIEW OF SYSTEMS: Constitutional: No fevers, chills, sweats, or change in appetite Eyes: No visual changes, double vision, eye pain Ear, nose and throat: No hearing loss, ear pain, nasal congestion, sore throat Cardiovascular: No chest pain, palpitations Respiratory:  No shortness of breath at rest or with exertion.   No wheezes GastrointestinaI: No nausea, vomiting, diarrhea, abdominal pain, fecal incontinence Genitourinary:  No dysuria, urinary retention or frequency.  No nocturia. Musculoskeletal:  No neck pain, back pain Integumentary: No rash, pruritus, skin lesions Neurological: as above Psychiatric: No depression at this time.  No anxiety Endocrine: No palpitations, diaphoresis, change in appetite, change in weigh or increased thirst Hematologic/Lymphatic:  No anemia, purpura, petechiae. Allergic/Immunologic: No itchy/runny eyes, nasal congestion, recent allergic reactions, rashes  ALLERGIES: Allergies  Allergen Reactions   Penicillins Shortness Of Breath, Nausea Only and Other (See Comments)    Made her sick/Childhood Allergy Has patient had a PCN reaction causing immediate rash, facial/tongue/throat swelling, SOB or lightheadedness with hypotension:yes Has patient had a PCN reaction causing severe rash involving mucus membranes or skin necrosis: No Has patient had a PCN reaction that required hospitalization: No Has patient had a PCN reaction occurring within the last 10 years:No .   Fentanyl Other (See Comments)    anxiety   Morphine And Related     Caused stomach to burn    Oxycontin [Oxycodone Hcl] Nausea And Vomiting  vomiting   Prednisone Hives   Valium [Diazepam]     Hyperactivity     HOME MEDICATIONS:  Current Outpatient Medications:    escitalopram (LEXAPRO) 10 MG tablet, Take 1 tablet (10 mg total) by mouth daily. (Patient not taking: Reported on 08/27/2021), Disp: 30 tablet, Rfl: 2    lamoTRIgine (LAMICTAL) 25 MG tablet, Take 25 mg by mouth daily. (Patient not taking: Reported on 08/27/2021), Disp: , Rfl:   PAST MEDICAL HISTORY: Past Medical History:  Diagnosis Date   Abdominal pain, recurrent    Allergy    Anxiety    Appendicitis    Crohn's disease of colon ([place]) 10/14   sees Dr. Blanca Friend at [facility]    Dysmenorrhea    GERD (gastroesophageal reflux disease)    not taking any medication   Shortness of breath dyspnea    ith thyroid problem    PAST SURGICAL HISTORY: Past Surgical History:  Procedure Laterality Date   APPENDECTOMY  07-14-11   laparoscopic    CHOLECYSTECTOMY  02/11/2012   Procedure: LAPAROSCOPIC CHOLECYSTECTOMY WITH INTRAOPERATIVE CHOLANGIOGRAM;  Surgeon: Gwenyth Ober, MD;  Location: [place];  Service: General;  Laterality: N/A;   ESOPHAGOGASTRODUODENOSCOPY  01/28/2012   Procedure: ESOPHAGOGASTRODUODENOSCOPY (EGD);  Surgeon: Oletha Blend, MD;  Location: [place];  Service: Gastroenterology;  Laterality: N/A;   THYROID LOBECTOMY Left 04/29/2015   Procedure: LEFT TOTAL THYROID LOBECTOMY;  Surgeon: Judeth Horn, MD;  Location: [place];  Service: General;  Laterality: Left;   WISDOM TOOTH EXTRACTION      FAMILY HISTORY: Family History  Problem Relation Age of Onset   Cancer Paternal Aunt        ovarian, kidney   Cancer Paternal Uncle        colon   Colon cancer Paternal Uncle    Cancer Maternal Grandmother        breast    Hypertension Maternal Grandmother    Irritable bowel syndrome Maternal Grandmother    Heart disease Maternal Grandmother    Hypertension Paternal Grandmother    Hyperlipidemia Paternal Grandmother     SOCIAL HISTORY:  Social History   Socioeconomic History   Marital status: Single    Spouse name: Not on file   Number of children: 0   Years of education: 14   Highest education level: Not on file  Occupational History   Occupation: [employer]   Occupation: [employer]  Tobacco Use   Smoking status: Never    Smokeless tobacco: Never  Scientific laboratory technician Use: Never used  Substance and Sexual Activity   Alcohol use: Yes    Comment: weekends   Drug use: No   Sexual activity: Never  Other Topics Concern   Not on file  Social History Narrative   Right handed   Caffeine use: coffee/soda (coffee 2-3 times per week, soda daily)   Social Determinants of Health   Financial Resource Strain: Not on file  Food Insecurity: Not on file  Transportation Needs: Not on file  Physical Activity: Not on file  Stress: Not on file  Social Connections: Not on file  Intimate Partner Violence: Not on file     PHYSICAL EXAM  Vitals:   08/27/21 0835  BP: 129/81  Pulse: 88  Weight: 251 lb (113.9 kg)  Height: 5' 7"  (1.702 m)    Body mass index is 39.31 kg/m.   General: The patient is well-developed and well-nourished and in no acute distress  HEENT:  Head is [place]/AT.  Sclera are anicteric.  Funduscopic exam shows elevation of the optic disc nasally bilaterally.  Venous pulsations were present.  Neck: No carotid bruits are noted.  The neck is nontender.  Cardiovascular: The heart has a regular rate and rhythm with a normal S1 and S2. There were no murmurs, gallops or rubs.    Skin: Extremities are without rash or  edema.  Musculoskeletal:  Back is nontender  Neurologic Exam  Mental status: The patient is alert and oriented x 3 at the time of the examination. The patient has apparent normal recent and remote memory, with an apparently normal attention span and concentration ability.   Speech is normal.  Cranial nerves: Extraocular movements are full. Pupils are equal, round, and reactive to light and accomodation.  Visual fields to confrontation are full.  Facial symmetry is present. There is good facial sensation to soft touch bilaterally.Facial strength is normal.  Trapezius and sternocleidomastoid strength is normal. No dysarthria is noted.  The tongue is midline, and the patient has symmetric  elevation of the soft palate. No obvious hearing deficits are noted.  Motor:  Muscle bulk is normal.   Tone is normal. Strength is  5 / 5 in all 4 extremities.   Sensory: Sensory testing is intact to pinprick, soft touch and vibration sensation in all 4 extremities.  Coordination: Cerebellar testing reveals good finger-nose-finger and heel-to-shin bilaterally.  Gait and station: Station is normal.   Gait is normal. Tandem gait is normal. Romberg is negative.   Reflexes: Deep tendon reflexes are symmetric and normal bilaterally.        DIAGNOSTIC DATA (LABS, IMAGING, TESTING) - I reviewed patient records, labs, notes, testing and imaging myself where available.  Lab Results  Component Value Date   WBC 10.0 04/17/2019   HGB 13.0 04/17/2019   HCT 39.3 04/17/2019   MCV 90.3 04/17/2019   PLT 299 04/17/2019      Component Value Date/Time   NA 138 04/17/2019 0446   K 3.5 04/17/2019 0446   CL 108 04/17/2019 0446   CO2 24 04/17/2019 0446   GLUCOSE 118 (H) 04/17/2019 0446   BUN 14 04/17/2019 0446   CREATININE 0.72 04/17/2019 0446   CREATININE 0.68 07/06/2013 1600   CALCIUM 8.7 (L) 04/17/2019 0446   PROT 8.0 04/17/2019 0446   ALBUMIN 3.8 04/17/2019 0446   AST 15 04/17/2019 0446   ALT 11 04/17/2019 0446   ALKPHOS 71 04/17/2019 0446   BILITOT 0.5 04/17/2019 0446   GFRNONAA >60 04/17/2019 0446   GFRAA >60 04/17/2019 0446   No results found for: CHOL, HDL, LDLCALC, LDLDIRECT, TRIG, CHOLHDL No results found for: HGBA1C No results found for: VITAMINB12 Lab Results  Component Value Date   TSH 3.43 05/31/2017       ASSESSMENT AND PLAN  Optic nerve edema - Plan: CT HEAD WO CONTRAST (5MM)  Claustrophobia    In summary, Ms. Fuhrman is a 25 year old woman who was found on routine ophthalmologic evaluation to have grade 1 optic nerve edema on exam, I note that she has blurring of the optic disc margins nasally and that venous pulsations were present.  To further evaluate this  we need to proceed with an imaging study to rule out hydrocephalus and other abnormalities and then proceed to lumbar puncture to measure opening pressure if the imaging is normal or near normal.  Given her symptoms, sinus thrombosis would be unlikely.  She is very claustrophobic but does not want to take antianxiety medicine to have  an MRI.  Therefore, we will check a CT scan.    If no hydrocephalus we will proceed with a lumbar puncture.  If hydrocephalus is present we will need to check an MRI to further evaluate.  If opening pressure is found to be elevated will initiate acetazolamide.  I will set up a follow-up for 3 months but she should call sooner if there are new or worsening symptoms.  Makinley Muscato A. Felecia Shelling, MD, [facility] 42/47/3192, 4:38 AM Certified in Neurology, Clinical Neurophysiology, Sleep Medicine and Neuroimaging  [facility] [address] [place], [place] [address] [phone]

## 2021-09-03 DIAGNOSIS — F4312 Post-traumatic stress disorder, chronic: Secondary | ICD-10-CM | POA: Diagnosis not present

## 2021-09-03 DIAGNOSIS — F320 Major depressive disorder, single episode, mild: Secondary | ICD-10-CM | POA: Diagnosis not present

## 2021-09-03 DIAGNOSIS — F3110 Bipolar disorder, current episode manic without psychotic features, unspecified: Secondary | ICD-10-CM | POA: Diagnosis not present

## 2021-09-05 ENCOUNTER — Other Ambulatory Visit: Payer: Self-pay | Admitting: Neurology

## 2021-09-05 ENCOUNTER — Ambulatory Visit
Admission: RE | Admit: 2021-09-05 | Discharge: 2021-09-05 | Disposition: A | Discharge status: Home / Self Care | Payer: BC Managed Care – PPO | Source: Ambulatory Visit | Attending: Neurology | Admitting: Neurology

## 2021-09-05 ENCOUNTER — Other Ambulatory Visit: Payer: Self-pay

## 2021-09-05 DIAGNOSIS — H4710 Unspecified papilledema: Secondary | ICD-10-CM

## 2021-09-07 ENCOUNTER — Telehealth: Payer: Self-pay | Admitting: *Deleted

## 2021-09-07 NOTE — Telephone Encounter (Signed)
-----   Message from Britt Bottom, MD sent at 09/05/2021  2:22 PM EDT ----- Please let her know that the CT scan of the head showed that the brain was normal.  However, the pituitary gland was pushed down.  We often see this when the spinal fluid pressure is elevated.  Therefore we need to check a lumbar puncture.  I placed the order.

## 2021-09-07 NOTE — Telephone Encounter (Signed)
Called and spoke with pt about results per Dr. Garth Bigness note. Pt verbalized understanding. She will await call to schedule LP. She will call back if she does not hear to get this set up.

## 2021-09-11 DIAGNOSIS — F4312 Post-traumatic stress disorder, chronic: Secondary | ICD-10-CM | POA: Diagnosis not present

## 2021-09-11 DIAGNOSIS — F3110 Bipolar disorder, current episode manic without psychotic features, unspecified: Secondary | ICD-10-CM | POA: Diagnosis not present

## 2021-09-11 DIAGNOSIS — F320 Major depressive disorder, single episode, mild: Secondary | ICD-10-CM | POA: Diagnosis not present

## 2021-09-15 ENCOUNTER — Ambulatory Visit
Admission: RE | Admit: 2021-09-15 | Discharge: 2021-09-15 | Disposition: A | Discharge status: Home / Self Care | Payer: BC Managed Care – PPO | Source: Ambulatory Visit | Attending: Neurology | Admitting: Neurology

## 2021-09-15 ENCOUNTER — Other Ambulatory Visit: Payer: Self-pay

## 2021-09-15 VITALS — BP 143/85 | HR 68

## 2021-09-15 DIAGNOSIS — H47339 Pseudopapilledema of optic disc, unspecified eye: Secondary | ICD-10-CM | POA: Diagnosis not present

## 2021-09-15 DIAGNOSIS — H4710 Unspecified papilledema: Secondary | ICD-10-CM | POA: Diagnosis not present

## 2021-09-15 LAB — PROTEIN, CSF: Total Protein, CSF: 22 mg/dL (ref 15–45)

## 2021-09-15 LAB — CSF CELL COUNT WITH DIFFERENTIAL
RBC Count, CSF: 0 cells/uL
WBC, CSF: 2 cells/uL (ref 0–5)

## 2021-09-15 LAB — GLUCOSE, CSF: Glucose, CSF: 63 mg/dL (ref 40–80)

## 2021-09-15 NOTE — Discharge Instructions (Signed)

## 2021-09-16 ENCOUNTER — Telehealth: Payer: Self-pay | Admitting: *Deleted

## 2021-09-16 MED ORDER — ACETAZOLAMIDE 250 MG PO TABS: ORAL_TABLET | ORAL | 5 refills | Status: DC

## 2021-09-16 NOTE — Telephone Encounter (Signed)
Called and spoke with pt. Relayed results per Dr. Garth Bigness note. Pt verbalized understanding and agreeable to plan. E-scribed rx to [facility] on file. Pt will call if she has any further questions moving forward.

## 2021-09-16 NOTE — Telephone Encounter (Signed)
-----   Message from Britt Bottom, MD sent at 09/15/2021  6:17 PM EDT ----- The spinal fluid pressure was elevated which explains the swelling in the back of the eyes  I would like her to start Diamox 250 mg 1 p.o. 3 times daily #90   with  5 refills For the first 2 weeks she can take it twice a day before going up to 3 times a day

## 2021-10-09 DIAGNOSIS — F320 Major depressive disorder, single episode, mild: Secondary | ICD-10-CM | POA: Diagnosis not present

## 2021-10-09 DIAGNOSIS — F4312 Post-traumatic stress disorder, chronic: Secondary | ICD-10-CM | POA: Diagnosis not present

## 2021-10-09 DIAGNOSIS — F3110 Bipolar disorder, current episode manic without psychotic features, unspecified: Secondary | ICD-10-CM | POA: Diagnosis not present

## 2021-11-14 ENCOUNTER — Emergency Department (HOSPITAL_BASED_OUTPATIENT_CLINIC_OR_DEPARTMENT_OTHER): Payer: BC Managed Care – PPO

## 2021-11-14 ENCOUNTER — Emergency Department (HOSPITAL_BASED_OUTPATIENT_CLINIC_OR_DEPARTMENT_OTHER)
Admission: EM | Admit: 2021-11-14 | Discharge: 2021-11-15 | Disposition: A | Discharge status: Home / Self Care | Payer: BC Managed Care – PPO | Attending: Emergency Medicine | Admitting: Emergency Medicine

## 2021-11-14 ENCOUNTER — Other Ambulatory Visit: Payer: Self-pay

## 2021-11-14 ENCOUNTER — Encounter (HOSPITAL_BASED_OUTPATIENT_CLINIC_OR_DEPARTMENT_OTHER): Payer: Self-pay | Admitting: Emergency Medicine

## 2021-11-14 DIAGNOSIS — R490 Dysphonia: Secondary | ICD-10-CM | POA: Insufficient documentation

## 2021-11-14 DIAGNOSIS — J392 Other diseases of pharynx: Secondary | ICD-10-CM | POA: Diagnosis not present

## 2021-11-14 DIAGNOSIS — G8929 Other chronic pain: Secondary | ICD-10-CM | POA: Diagnosis not present

## 2021-11-14 DIAGNOSIS — T502X5A Adverse effect of carbonic-anhydrase inhibitors, benzothiadiazides and other diuretics, initial encounter: Secondary | ICD-10-CM | POA: Insufficient documentation

## 2021-11-14 DIAGNOSIS — R1030 Lower abdominal pain, unspecified: Secondary | ICD-10-CM | POA: Insufficient documentation

## 2021-11-14 DIAGNOSIS — R1310 Dysphagia, unspecified: Secondary | ICD-10-CM | POA: Diagnosis not present

## 2021-11-14 DIAGNOSIS — T50905A Adverse effect of unspecified drugs, medicaments and biological substances, initial encounter: Secondary | ICD-10-CM

## 2021-11-14 DIAGNOSIS — J029 Acute pharyngitis, unspecified: Secondary | ICD-10-CM | POA: Insufficient documentation

## 2021-11-14 DIAGNOSIS — R059 Cough, unspecified: Secondary | ICD-10-CM | POA: Insufficient documentation

## 2021-11-14 DIAGNOSIS — Z20822 Contact with and (suspected) exposure to covid-19: Secondary | ICD-10-CM | POA: Insufficient documentation

## 2021-11-14 LAB — CBC WITH DIFFERENTIAL/PLATELET
Abs Immature Granulocytes: 0.01 10*3/uL (ref 0.00–0.07)
Basophils Absolute: 0 10*3/uL (ref 0.0–0.1)
Basophils Relative: 1 %
Eosinophils Absolute: 0.1 10*3/uL (ref 0.0–0.5)
Eosinophils Relative: 1 %
HCT: 40.6 % (ref 36.0–46.0)
Hemoglobin: 13.9 g/dL (ref 12.0–15.0)
Immature Granulocytes: 0 %
Lymphocytes Relative: 30 %
Lymphs Abs: 1.7 10*3/uL (ref 0.7–4.0)
MCH: 30 pg (ref 26.0–34.0)
MCHC: 34.2 g/dL (ref 30.0–36.0)
MCV: 87.7 fL (ref 80.0–100.0)
Monocytes Absolute: 0.8 10*3/uL (ref 0.1–1.0)
Monocytes Relative: 14 %
Neutro Abs: 3.2 10*3/uL (ref 1.7–7.7)
Neutrophils Relative %: 54 %
Platelets: 332 10*3/uL (ref 150–400)
RBC: 4.63 MIL/uL (ref 3.87–5.11)
RDW: 14 % (ref 11.5–15.5)
WBC: 5.8 10*3/uL (ref 4.0–10.5)
nRBC: 0 % (ref 0.0–0.2)

## 2021-11-14 LAB — COMPREHENSIVE METABOLIC PANEL
ALT: 10 U/L (ref 0–44)
AST: 14 U/L — ABNORMAL LOW (ref 15–41)
Albumin: 4 g/dL (ref 3.5–5.0)
Alkaline Phosphatase: 76 U/L (ref 38–126)
Anion gap: 12 (ref 5–15)
BUN: 9 mg/dL (ref 6–20)
CO2: 15 mmol/L — ABNORMAL LOW (ref 22–32)
Calcium: 8.9 mg/dL (ref 8.9–10.3)
Chloride: 111 mmol/L (ref 98–111)
Creatinine, Ser: 0.75 mg/dL (ref 0.44–1.00)
GFR, Estimated: >60 mL/min (ref >60)
Glucose, Bld: 90 mg/dL (ref 70–99)
Potassium: 3.5 mmol/L (ref 3.5–5.1)
Sodium: 138 mmol/L (ref 135–145)
Total Bilirubin: 0.4 mg/dL (ref 0.3–1.2)
Total Protein: 8.9 g/dL — ABNORMAL HIGH (ref 6.5–8.1)

## 2021-11-14 LAB — MAGNESIUM: Magnesium: 1.8 mg/dL (ref 1.7–2.4)

## 2021-11-14 LAB — RESP PANEL BY RT-PCR (FLU A&B, COVID) ARPGX2
Influenza A by PCR: NEGATIVE
Influenza B by PCR: NEGATIVE
SARS Coronavirus 2 by RT PCR: NEGATIVE

## 2021-11-14 LAB — GROUP A STREP BY PCR: Group A Strep by PCR: NOT DETECTED

## 2021-11-14 MED ORDER — LACTATED RINGERS IV BOLUS
1000.0000 mL | Freq: Once | INTRAVENOUS | Status: AC
  Administered 2021-11-14: 1000 mL via INTRAVENOUS

## 2021-11-14 NOTE — ED Provider Notes (Signed)
Nursing notes and vitals signs, including pulse oximetry, reviewed.  Summary of this visit's results, reviewed by myself:  EKG:  EKG Interpretation  Date/Time:    Ventricular Rate:    PR Interval:    QRS Duration:   QT Interval:    QTC Calculation:   R Axis:     Text Interpretation:          Labs:  Results for orders placed or performed during the hospital encounter of 11/14/21 (from the past 24 hour(s))  Resp Panel by RT-PCR (Flu A&B, Covid) Nasopharyngeal Swab     Status: None   Collection Time: 11/14/21  9:41 PM   Specimen: Nasopharyngeal Swab; Nasopharyngeal(NP) swabs in vial transport medium  Result Value Ref Range   SARS Coronavirus 2 by RT PCR NEGATIVE NEGATIVE   Influenza A by PCR NEGATIVE NEGATIVE   Influenza B by PCR NEGATIVE NEGATIVE  CBC with Differential     Status: None   Collection Time: 11/14/21 10:56 PM  Result Value Ref Range   WBC 5.8 4.0 - 10.5 K/uL   RBC 4.63 3.87 - 5.11 MIL/uL   Hemoglobin 13.9 12.0 - 15.0 g/dL   HCT 40.6 36.0 - 46.0 %   MCV 87.7 80.0 - 100.0 fL   MCH 30.0 26.0 - 34.0 pg   MCHC 34.2 30.0 - 36.0 g/dL   RDW 14.0 11.5 - 15.5 %   Platelets 332 150 - 400 K/uL   nRBC 0.0 0.0 - 0.2 %   Neutrophils Relative % 54 %   Neutro Abs 3.2 1.7 - 7.7 K/uL   Lymphocytes Relative 30 %   Lymphs Abs 1.7 0.7 - 4.0 K/uL   Monocytes Relative 14 %   Monocytes Absolute 0.8 0.1 - 1.0 K/uL   Eosinophils Relative 1 %   Eosinophils Absolute 0.1 0.0 - 0.5 K/uL   Basophils Relative 1 %   Basophils Absolute 0.0 0.0 - 0.1 K/uL   Immature Granulocytes 0 %   Abs Immature Granulocytes 0.01 0.00 - 0.07 K/uL  Comprehensive metabolic panel     Status: Abnormal   Collection Time: 11/14/21 10:56 PM  Result Value Ref Range   Sodium 138 135 - 145 mmol/L   Potassium 3.5 3.5 - 5.1 mmol/L   Chloride 111 98 - 111 mmol/L   CO2 15 (L) 22 - 32 mmol/L   Glucose, Bld 90 70 - 99 mg/dL   BUN 9 6 - 20 mg/dL   Creatinine, Ser 0.75 0.44 - 1.00 mg/dL   Calcium 8.9 8.9 -  10.3 mg/dL   Total Protein 8.9 (H) 6.5 - 8.1 g/dL   Albumin 4.0 3.5 - 5.0 g/dL   AST 14 (L) 15 - 41 U/L   ALT 10 0 - 44 U/L   Alkaline Phosphatase 76 38 - 126 U/L   Total Bilirubin 0.4 0.3 - 1.2 mg/dL   GFR, Estimated >60 >60 mL/min   Anion gap 12 5 - 15  Magnesium     Status: None   Collection Time: 11/14/21 10:56 PM  Result Value Ref Range   Magnesium 1.8 1.7 - 2.4 mg/dL  Group A Strep by PCR     Status: None   Collection Time: 11/14/21 10:56 PM   Specimen: Throat; Sterile Swab  Result Value Ref Range   Group A Strep by PCR NOT DETECTED NOT DETECTED    Imaging Studies: DG Chest Portable 1 View  Result Date: 11/14/2021 CLINICAL DATA:  Cough. EXAM: PORTABLE CHEST 1 VIEW COMPARISON:  Chest  x-ray 05/02/2015. FINDINGS: Surgical clips overlie the lower left neck. The heart size and mediastinal contours are within normal limits. Both lungs are clear. The visualized skeletal structures are unremarkable. IMPRESSION: No active disease. Electronically Signed   By: Ronney Asters M.D.   On: 11/14/2021 22:42       Amanii Snethen, Jenny Reichmann, MD 11/14/21 2358

## 2021-11-14 NOTE — ED Triage Notes (Signed)
Pt reports hoarseness x 4 days, difficulty swallowing pills called Diamox, that she started in November, x 2 days. Dry cough x 2 days.

## 2021-11-14 NOTE — ED Provider Notes (Signed)
[facility] Provider Note   CSN: [id number] Arrival date & time: 11/14/21  1816     History Chief Complaint  Patient presents with   Cough    ELVIN BANKER is a 25 y.o. female.   Cough Associated symptoms: sore throat   Associated symptoms: no chest pain, no chills, no diaphoresis, no ear pain, no fever, no headaches, no myalgias, no rash, no rhinorrhea, no shortness of breath and no wheezing   Patient with history of Crohn's disease (in remission, not on maintenance medication), thyroidectomy (not on Synthroid), and IIH (found incidentally 2 months ago and currently treated with Diamox), presents for improving hoarseness over the past 4 days and persistent difficulty swallowing pills.  She also endorses a recent dry cough.  Patient reports that 4 days ago, she awoke with a hoarse voice.  At that time, she also had some throat scratchiness.  She denies any other URI symptoms at that time.  Throat symptoms have improved, however, patient has developed a cough and endorses difficulty swallowing her large pills.  She has had lower abdominal pain which she states is chronic for her.  She has had diarrhea which she also states is normal for her.  She has had decreased p.o. intake due to difficulty swallowing.  She also reports that she experiences worsening abdominal pain when she does eat or drink.    Past Medical History:  Diagnosis Date   Abdominal pain, recurrent    Allergy    Anxiety    Appendicitis    Crohn's disease of colon ([place]) 10/14   sees Dr. Blanca Friend at [facility]    Dysmenorrhea    GERD (gastroesophageal reflux disease)    not taking any medication   Shortness of breath dyspnea    ith thyroid problem    Patient Active Problem List   Diagnosis Date Noted   Optic nerve edema 08/27/2021   Claustrophobia 08/27/2021   Eczema 09/29/2020   Goiter, non-toxic 04/29/2015   Vomiting 10/05/2014   Hypokalemia 10/05/2014   Elevated LFTs  10/05/2014   Nausea and vomiting 10/04/2014   Thyroid nodule 10/04/2014   Abdominal pain 10/04/2014   Low TSH level 07/18/2014   Thyromegaly 06/06/2014   Hives 03/08/2014   Crohn's disease ([place]) 03/01/2014   Heme + stool 08/28/2013   Abdominal pain, other specified site 08/28/2013   Change in bowel habits 08/28/2013   Postop check 03/03/2012   Generalized abdominal pain    PITYRIASIS ROSEA 09/11/2010   GERD 07/23/2010   DYSMENORRHEA 07/23/2010   BOILS, RECURRENT 10/22/2008    Past Surgical History:  Procedure Laterality Date   APPENDECTOMY  07-14-11   laparoscopic    CHOLECYSTECTOMY  02/11/2012   Procedure: LAPAROSCOPIC CHOLECYSTECTOMY WITH INTRAOPERATIVE CHOLANGIOGRAM;  Surgeon: Gwenyth Ober, MD;  Location: [place];  Service: General;  Laterality: N/A;   ESOPHAGOGASTRODUODENOSCOPY  01/28/2012   Procedure: ESOPHAGOGASTRODUODENOSCOPY (EGD);  Surgeon: Oletha Blend, MD;  Location: [place];  Service: Gastroenterology;  Laterality: N/A;   THYROID LOBECTOMY Left 04/29/2015   Procedure: LEFT TOTAL THYROID LOBECTOMY;  Surgeon: Judeth Horn, MD;  Location: [place];  Service: General;  Laterality: Left;   WISDOM TOOTH EXTRACTION       OB History     Gravida  0   Para      Term      Preterm      AB      Living  SAB      IAB      Ectopic      Multiple      Live Births              Family History  Problem Relation Age of Onset   Cancer Paternal Aunt        ovarian, kidney   Cancer Paternal Uncle        colon   Colon cancer Paternal Uncle    Cancer Maternal Grandmother        breast    Hypertension Maternal Grandmother    Irritable bowel syndrome Maternal Grandmother    Heart disease Maternal Grandmother    Hypertension Paternal Grandmother    Hyperlipidemia Paternal Grandmother     Social History   Tobacco Use   Smoking status: Never   Smokeless tobacco: Never  Vaping Use   Vaping Use: Never used  Substance Use Topics   Alcohol use: Yes     Comment: weekends   Drug use: No    Home Medications Prior to Admission medications   Medication Sig Start Date End Date Taking? Authorizing Provider  acetaZOLAMIDE (DIAMOX) 250 MG tablet Take 1 tablet twice a day for 2 weeks. Then increase to 1 tablet three times daily thereafter 09/16/21   Britt Bottom, MD    Allergies    Penicillins, Fentanyl, Morphine and related, Oxycontin [oxycodone hcl], Prednisone, and Valium [diazepam]  Review of Systems   Review of Systems  Constitutional:  Positive for appetite change. Negative for chills, diaphoresis, fatigue and fever.  HENT:  Positive for sore throat, trouble swallowing and voice change. Negative for congestion, ear pain, facial swelling, hearing loss, rhinorrhea and sinus pain.   Eyes:  Negative for pain and visual disturbance.  Respiratory:  Positive for cough. Negative for chest tightness, shortness of breath, wheezing and stridor.   Cardiovascular:  Negative for chest pain, palpitations and leg swelling.  Gastrointestinal:  Positive for abdominal pain, diarrhea and nausea. Negative for abdominal distention, constipation and vomiting.  Genitourinary:  Negative for dysuria, flank pain, frequency, hematuria, urgency, vaginal bleeding and vaginal discharge.  Musculoskeletal:  Negative for arthralgias, back pain, joint swelling, myalgias and neck pain.  Skin:  Negative for color change and rash.  Neurological:  Negative for dizziness, seizures, syncope, weakness, light-headedness, numbness and headaches.  Hematological:  Does not bruise/bleed easily.  Psychiatric/Behavioral:  Negative for confusion and decreased concentration.   All other systems reviewed and are negative.  Physical Exam Updated Vital Signs BP 128/84 (BP Location: Right Arm)    Pulse 96    Temp 98.2 F (36.8 C) (Oral)    Resp 18    Ht 5' 7"  (1.702 m)    Wt 107 kg    LMP 11/03/2021    SpO2 100%    BMI 36.96 kg/m   Physical Exam Vitals and nursing note reviewed.   Constitutional:      General: She is not in acute distress.    Appearance: Normal appearance. She is well-developed. She is not ill-appearing, toxic-appearing or diaphoretic.  HENT:     Head: Normocephalic and atraumatic.     Right Ear: External ear normal.     Left Ear: External ear normal.     Nose: Nose normal. No congestion.     Mouth/Throat:     Mouth: Mucous membranes are moist.     Pharynx: Oropharynx is clear. Posterior oropharyngeal erythema present. No oropharyngeal exudate.     Comments: Mild petechiae  to oropharynx Eyes:     General: No scleral icterus.    Extraocular Movements: Extraocular movements intact.     Conjunctiva/sclera: Conjunctivae normal.  Cardiovascular:     Rate and Rhythm: Normal rate and regular rhythm.     Heart sounds: No murmur heard. Pulmonary:     Effort: Pulmonary effort is normal. No respiratory distress.     Breath sounds: Normal breath sounds. No wheezing or rales.  Abdominal:     General: Abdomen is flat.     Palpations: Abdomen is soft.     Tenderness: There is no abdominal tenderness.  Musculoskeletal:        General: No swelling. Normal range of motion.     Cervical back: Normal range of motion and neck supple. No rigidity or tenderness.     Right lower leg: No edema.     Left lower leg: No edema.  Skin:    General: Skin is warm and dry.     Capillary Refill: Capillary refill takes less than 2 seconds.     Coloration: Skin is not jaundiced or pale.  Neurological:     General: No focal deficit present.     Mental Status: She is alert and oriented to person, place, and time.     Cranial Nerves: No cranial nerve deficit.     Sensory: No sensory deficit.     Motor: No weakness.     Coordination: Coordination normal.  Psychiatric:        Mood and Affect: Mood normal.        Behavior: Behavior normal.        Thought Content: Thought content normal.        Judgment: Judgment normal.    ED Results / Procedures / Treatments    Labs (all labs ordered are listed, but only abnormal results are displayed) Labs Reviewed  COMPREHENSIVE METABOLIC PANEL - Abnormal; Notable for the following components:      Result Value   CO2 15 (*)    Total Protein 8.9 (*)    AST 14 (*)    All other components within normal limits  RESP PANEL BY RT-PCR (FLU A&B, COVID) ARPGX2  GROUP A STREP BY PCR  CBC WITH DIFFERENTIAL/PLATELET  MAGNESIUM  TSH    EKG None  Radiology DG Chest Portable 1 View  Result Date: 11/14/2021 CLINICAL DATA:  Cough. EXAM: PORTABLE CHEST 1 VIEW COMPARISON:  Chest x-ray 05/02/2015. FINDINGS: Surgical clips overlie the lower left neck. The heart size and mediastinal contours are within normal limits. Both lungs are clear. The visualized skeletal structures are unremarkable. IMPRESSION: No active disease. Electronically Signed   By: Ronney Asters M.D.   On: 11/14/2021 22:42    Procedures Procedures   Medications Ordered in ED Medications  lactated ringers bolus 1,000 mL (0 mLs Intravenous Stopped 11/15/21 0009)    ED Course  I have reviewed the triage vital signs and the nursing notes.  Pertinent labs & imaging results that were available during my care of the patient were reviewed by me and considered in my medical decision making (see chart for details).    MDM Rules/Calculators/A&P                         Patient is a 25 year old female with history of Crohn's disease, IIA H, and thyroidectomy, presenting for hoarse scratchy throat, difficulty swallowing, in addition to mild abdominal pain.  On exam, she is well-appearing.  She states  that her abdominal pain is consistent with her Crohn's disease.  She reports that she is currently in remission and not requiring any maintenance medications.  She has had some recent diarrhea which she says is normal for her.  Patient's breathing is even and unlabored.  On inspection of her oropharynx, there is some mild erythema with some petechiae.  Given her use of  Diamox and decreased p.o. intake, labs to be obtained to assess for electrolyte abnormalities.  Bolus of IV fluids was given.  Given the oropharyngeal erythema and petechiae, strep testing to be performed.  Care of patient was signed out to oncoming ED provider.  Final Clinical Impression(s) / ED Diagnoses Final diagnoses:  Adverse drug effect, initial encounter    Rx / DC Orders ED Discharge Orders     None        Godfrey Pick, MD 11/15/21 [id number]

## 2021-11-15 LAB — TSH: TSH: 5.027 u[IU]/mL — ABNORMAL HIGH (ref 0.350–4.500)

## 2021-11-15 NOTE — ED Notes (Signed)
Patient discharged to home.  All discharge instructions reviewed.  Patient verbalized understanding via teachback method.  VS WDL.  Respirations even and unlabored.  Ambulatory out of ED.   °

## 2021-11-17 ENCOUNTER — Telehealth: Payer: Self-pay | Admitting: Neurology

## 2021-11-17 NOTE — Telephone Encounter (Signed)
Called pt back. Has been on Diamox since 09/16/21. Taking 253m po BID, unable to take more d/t decreasing her appetite (Dr. SFelecia Shellingwanted her to eventually take TID)  Last week Wed, woke up w/ no voice/dry cough. Googled and saw this is SE from Diamox. [place] Felt like she was going to pass out. Had slight fever. Tested for strep/flu/Covid but all negative. Stopped Diamox 11/14/21. Has not noticed any improvement. Thinks she may have caught cold/may be unrelated to Diamox. Aware I will send MD message to review and will call back tomorrow at latest to let her know his thoughts. She verbalized understanding.

## 2021-11-17 NOTE — Telephone Encounter (Signed)
Called and spoke with pt. She prefers to restart Diamox to see how to does. She will call back if she has any further issues.

## 2021-11-17 NOTE — Telephone Encounter (Signed)
Dr. Felecia Shelling- just wanted to clarify, she thinks she has a cold. Thought at first sx d/t diamox but now thinking it is a cold. She wanted to confirm w/ you if you thought it was r/t med or not?

## 2021-11-17 NOTE — Telephone Encounter (Signed)
Pt called stating that she had to go to the hospital due to a reaction to the acetaZOLAMIDE (DIAMOX) 250 MG tablet that was prescribed to her and she would like to discuss.

## 2021-12-03 ENCOUNTER — Telehealth (INDEPENDENT_AMBULATORY_CARE_PROVIDER_SITE_OTHER): Payer: BC Managed Care – PPO | Admitting: Family Medicine

## 2021-12-03 ENCOUNTER — Encounter: Payer: Self-pay | Admitting: Family Medicine

## 2021-12-03 VITALS — Temp 100.5°F

## 2021-12-03 DIAGNOSIS — U071 COVID-19: Secondary | ICD-10-CM | POA: Diagnosis not present

## 2021-12-03 MED ORDER — BENZONATATE 100 MG PO CAPS: 100.0000 mg | ORAL_CAPSULE | Freq: Three times a day (TID) | ORAL | 0 refills | Status: DC | PRN

## 2021-12-03 NOTE — Progress Notes (Signed)
Virtual Visit via Video Note  I connected with   on 12/03/21 at  3:40 PM EST by a video enabled telemedicine application and verified that I am speaking with the correct person using two identifiers.  Location patient: [place] Location provider:work or home office Persons participating in the virtual visit: patient, provider  I discussed the limitations and requested verbal permission for telemedicine visit. The patient expressed understanding and agreed to proceed.   HPI:  Acute telemedicine visit for Covid19: -Onset: about 2 days ago, tested positive for covid today -Symptoms include: fever, sore throat, nasal congestion, body aches, cough, feels tired -Denies:SOB, CP, NVD -is drinking fluids and staying hydrated -Pertinent past medical history: see below, reports has not had covid before -denies any chance of pregnancy -Pertinent medication allergies: Allergies  Allergen Reactions   Penicillins Shortness Of Breath, Nausea Only and Other (See Comments)    Made her sick/Childhood Allergy Has patient had a PCN reaction causing immediate rash, facial/tongue/throat swelling, SOB or lightheadedness with hypotension:yes Has patient had a PCN reaction causing severe rash involving mucus membranes or skin necrosis: No Has patient had a PCN reaction that required hospitalization: No Has patient had a PCN reaction occurring within the last 10 years:No .   Fentanyl Other (See Comments)    anxiety   Morphine And Related     Caused stomach to burn    Oxycontin [Oxycodone Hcl] Nausea And Vomiting    vomiting   Prednisone Hives   Valium [Diazepam]     Hyperactivity   -COVID-19 vaccine status: has had two doses Immunization History  Administered Date(s) Administered   Influenza-Unspecified 08/07/2014   PFIZER(Purple Top)SARS-COV-2 Vaccination 06/11/2020, 07/25/2020     ROS: See pertinent positives and negatives per HPI.  Past Medical History:  Diagnosis Date   Abdominal pain,  recurrent    Allergy    Anxiety    Appendicitis    Crohn's disease of colon ([place]) 10/14   sees Dr. Blanca Friend at [facility]    Dysmenorrhea    GERD (gastroesophageal reflux disease)    not taking any medication   Shortness of breath dyspnea    ith thyroid problem    Past Surgical History:  Procedure Laterality Date   APPENDECTOMY  07-14-11   laparoscopic    CHOLECYSTECTOMY  02/11/2012   Procedure: LAPAROSCOPIC CHOLECYSTECTOMY WITH INTRAOPERATIVE CHOLANGIOGRAM;  Surgeon: Gwenyth Ober, MD;  Location: [place];  Service: General;  Laterality: N/A;   ESOPHAGOGASTRODUODENOSCOPY  01/28/2012   Procedure: ESOPHAGOGASTRODUODENOSCOPY (EGD);  Surgeon: Oletha Blend, MD;  Location: [place];  Service: Gastroenterology;  Laterality: N/A;   THYROID LOBECTOMY Left 04/29/2015   Procedure: LEFT TOTAL THYROID LOBECTOMY;  Surgeon: Judeth Horn, MD;  Location: [place];  Service: General;  Laterality: Left;   WISDOM TOOTH EXTRACTION       Current Outpatient Medications:    acetaZOLAMIDE (DIAMOX) 250 MG tablet, Take 1 tablet twice a day for 2 weeks. Then increase to 1 tablet three times daily thereafter, Disp: 90 tablet, Rfl: 5   benzonatate (TESSALON PERLES) 100 MG capsule, Take 1 capsule (100 mg total) by mouth 3 (three) times daily as needed., Disp: 30 capsule, Rfl: 0  EXAM:  VITALS per patient if applicable:  GENERAL: alert, oriented, appears well and in no acute distress  HEENT: atraumatic, conjunttiva clear, no obvious abnormalities on inspection of external nose and ears  NECK: normal movements of the head and neck  LUNGS: on inspection no signs of respiratory distress, breathing rate  appears normal, no obvious gross SOB, gasping or wheezing  CV: no obvious cyanosis  MS: moves all visible extremities without noticeable abnormality  PSYCH/NEURO: pleasant and cooperative, no obvious depression or anxiety, speech and thought processing grossly intact  ASSESSMENT AND PLAN:  Discussed the  following assessment and plan:  COVID-19   Discussed treatment options and risk of drug interactions, ideal treatment window, potential complications, isolation and precautions for COVID-19.  Checked for/reviewed any labs done in the last 90 days with GFR listed in HPI if available. After lengthy discussion, the patient opted against antiviral treatment at this time. The patient did want a prescription for cough, Tessalon Rx sent.  Other symptomatic care measures summarized in patient instructions. Work/School slipped offered: provided in patient instructions   Advised to seek prompt virtual visit or in person care if worsening, new symptoms arise, or if is not improving with treatment as expected per our conversation of expected course. Discussed options for follow up care. Did let this patient know that I do telemedicine on Tuesdays and Thursdays for [place] and those are the days I am logged into the system. Advised to schedule follow up visit with PCP, [place] virtual visits or [facility] if any further questions or concerns to avoid delays in care.   I discussed the assessment and treatment plan with the patient. The patient was provided an opportunity to ask questions and all were answered. The patient agreed with the plan and demonstrated an understanding of the instructions.     Julie Kern, DO

## 2021-12-03 NOTE — Patient Instructions (Signed)
° ° ° °--------------------------------------------------------------------------------------------------------------------------- ° ° ° °  WORK SLIP:  Patient Julie Ward,  [birth year], was seen for a medical visit today, 12/03/21 . Please excuse from work for a COVID/flu like illness. If Covid19 testing is positive advise 10 days minimum from the onset of symptoms (12/02/20) PLUS 1 day of no fever and improved symptoms. Will defer to employer for a sooner return to work if patient has 2 negative covid tests 48 hours apart and is feeling better, or if symptoms have resolved, it is greater than 5 days since the positive test and the patient can wear a high-quality, tight fitting mask such as N95 or KN95 at all times for an additional 5 days. Would also suggest COVID19 antigen testing is negative prior to early return from a Covid illness.  Sincerely: E-signature: Dr. Colin Benton, DO [facility] Ph: [phone]   ------------------------------------------------------------------------------------------------------------------------------    HOME CARE TIPS:  -I sent the medication(s) we discussed to your pharmacy: Meds ordered this encounter  Medications   benzonatate (TESSALON PERLES) 100 MG capsule    Sig: Take 1 capsule (100 mg total) by mouth 3 (three) times daily as needed.    Dispense:  30 capsule    Refill:  0     -can use tylenol  if needed for fevers, aches and pains per instructions  -can use nasal saline a few times per day if you have nasal congestion  -stay hydrated, drink plenty of fluids and eat small healthy meals - avoid dairy  -can take 1000 IU (103mg) Vit D3 and 100-500 mg of Vit C daily per instructions  -stay home while sick, except to seek medical care. If you have COVID19, you will likely be contagious for 7-10 days. Flu or Influenza is likely contagious for about 7 days. Other respiratory viral infections remain contagious for 5-10+ days  depending on the virus and many other factors. Wear a good mask that fits snugly (such as N95 or KN95) if around others to reduce the risk of transmission.  It was nice to meet you today, and I really hope you are feeling better soon. I help [facility] out with telemedicine visits on Tuesdays and Thursdays and am happy to help if you need a follow up virtual visit on those days. Otherwise, if you have any concerns or questions following this visit please schedule a follow up visit with your Primary Care doctor or seek care at a local urgent care clinic to avoid delays in care.    Seek in person care or schedule a follow up video visit promptly if your symptoms worsen, new concerns arise or you are not improving with treatment. Call 911 and/or seek emergency care if your symptoms are severe or life threatening.

## 2021-12-21 NOTE — Progress Notes (Signed)
Chief Complaint  Patient presents with   Follow-up    Rm 10, alone. Here for 3 month f/u for optic nerve edema. Pt has had to stop taking Diamox 3x since the end of last year. Pt would get sick and stop taking Diamox and restart once better. Pt reports every time she started back on medication she would get sick again. Pt believes that Diamox has been making her get sick. Pt has been completely off medication since last week.      HISTORY OF PRESENT ILLNESS:  12/22/21 ALL:  Julie Ward is a 26 y.o. female here today for follow up for IIH. Opening pressure 32. Bilateral papilledema with ophthalmology exam. She was previously started on Diamox 250 mg, she has been reporting feeling sick on it and is unable to tolerate it. Julie. Felecia Ward advised trying furosemide 20 mg. At this time she denies having any headaches and only having occasional pressure behind the eyes in the mornings. No vision changes or vision loss. She is working on weight loss at this time and is interested in seeing health weight and wellness. She sees [facility] for her eye exams and will continue close follow up with them.    HISTORY (copied from Julie Ward previous note)  I had the pleasure of seeing your patient, Julie Ward, at [facility] for neurologic consultation regarding her optic disc edema and concern about idiopathic intracranial hypertension.   She is a 26 year old woman who was found on routine eye exam to have mild optic nerve edema bilaterally.     She saw Julie. Gladys Ward for a routine eye exam (telemedicine with a technician live with the patient) who noted optic disc edema on routine eye exam.  OCT RNFL confirmed optic disc edema and no evidence of drusen.  She saw her ophthalmology 08/21/2021.  She was referred to Julie. Tobe Ward who confirmed the bilateral optic nerve edema, grade 1.   Visual acuity was 20/20.  Intraocular pressures were normal (16 OD and OS).  She denies any  visual field issues.   She denies eye pain or frequent headache.  She denies pulsatile tinnitus.   Rarely, she feels lightheaded if she stands up fast.   No diplopia.      She is referred for further evaluation and treatment.   She used to weight 277 and no weighs 255.       She gets occasional mild headaches that resolve on their own.  She presented to [facility] room 05/01/2020 due to a severe headache lasting 4 days.  She had a CT scan at [facility]  05/01/2020.  It was read as showing no acute intracranial abnormality.  The pituitary is not mentioned on the report.     REVIEW OF SYSTEMS: Out of a complete 14 system review of symptoms, the patient complains only of the following symptoms, Pressure behind the eyes and all other reviewed systems are negative.   ALLERGIES: Allergies  Allergen Reactions   Penicillins Shortness Of Breath, Nausea Only and Other (See Comments)    Made her sick/Childhood Allergy Has patient had a PCN reaction causing immediate rash, facial/tongue/throat swelling, SOB or lightheadedness with hypotension:yes Has patient had a PCN reaction causing severe rash involving mucus membranes or skin necrosis: No Has patient had a PCN reaction that required hospitalization: No Has patient had a PCN reaction occurring within the last 10 years:No .   Fentanyl Other (See Comments)    anxiety  Morphine And Related     Caused stomach to burn    Oxycontin [Oxycodone Hcl] Nausea And Vomiting    vomiting   Prednisone Hives   Valium [Diazepam]     Hyperactivity      HOME MEDICATIONS: Outpatient Medications Prior to Visit  Medication Sig Dispense Refill   acetaZOLAMIDE (DIAMOX) 250 MG tablet Take 1 tablet twice a day for 2 weeks. Then increase to 1 tablet three times daily thereafter 90 tablet 5   benzonatate (TESSALON PERLES) 100 MG capsule Take 1 capsule (100 mg total) by mouth 3 (three) times daily as needed. 30 capsule 0   No facility-administered  medications prior to visit.     PAST MEDICAL HISTORY: Past Medical History:  Diagnosis Date   Abdominal pain, recurrent    Allergy    Anxiety    Appendicitis    Crohn's disease of colon ([place]) 10/14   sees Julie. Blanca Ward at [facility]    Dysmenorrhea    GERD (gastroesophageal reflux disease)    not taking any medication   Shortness of breath dyspnea    ith thyroid problem     PAST SURGICAL HISTORY: Past Surgical History:  Procedure Laterality Date   APPENDECTOMY  07-14-11   laparoscopic    CHOLECYSTECTOMY  02/11/2012   Procedure: LAPAROSCOPIC CHOLECYSTECTOMY WITH INTRAOPERATIVE CHOLANGIOGRAM;  Surgeon: Julie Ober, MD;  Location: [facility];  Service: General;  Laterality: N/A;   ESOPHAGOGASTRODUODENOSCOPY  01/28/2012   Procedure: ESOPHAGOGASTRODUODENOSCOPY (EGD);  Surgeon: Julie Blend, MD;  Location: [facility];  Service: Gastroenterology;  Laterality: N/A;   THYROID LOBECTOMY Left 04/29/2015   Procedure: LEFT TOTAL THYROID LOBECTOMY;  Surgeon: Julie Horn, MD;  Location: [facility];  Service: General;  Laterality: Left;   WISDOM TOOTH EXTRACTION       FAMILY HISTORY: Family History  Problem Relation Age of Onset   Cancer Paternal Aunt        ovarian, kidney   Cancer Paternal Uncle        colon   Colon cancer Paternal Uncle    Cancer Maternal Grandmother        breast    Hypertension Maternal Grandmother    Irritable bowel syndrome Maternal Grandmother    Heart disease Maternal Grandmother    Hypertension Paternal Grandmother    Hyperlipidemia Paternal Grandmother      SOCIAL HISTORY: Social History   Socioeconomic History   Marital status: Single    Spouse name: Not on file   Number of children: 0   Years of education: 14   Highest education level: Not on file  Occupational History   Occupation: [employer]   Occupation: [employer]  Tobacco Use   Smoking status: Never   Smokeless tobacco: Never  Scientific laboratory technician Use: Never used  Substance and Sexual  Activity   Alcohol use: Yes    Comment: weekends   Drug use: No   Sexual activity: Never  Other Topics Concern   Not on file  Social History Narrative   Right handed   Caffeine use: coffee/soda (coffee 2-3 times per week, soda daily)   Social Determinants of Health   Financial Resource Strain: Not on file  Food Insecurity: Not on file  Transportation Needs: Not on file  Physical Activity: Not on file  Stress: Not on file  Social Connections: Not on file  Intimate Partner Violence: Not on file     PHYSICAL EXAM  Vitals:   12/22/21 0849  BP:  122/78  Pulse: 76  Weight: 243 lb 9.6 oz (110.5 kg)  Height: 5' 7"  (1.702 m)   Body mass index is 38.15 kg/m.  Generalized: Well developed, in no acute distress  Cardiology: normal rate and rhythm, no murmur auscultated  Respiratory: clear to auscultation bilaterally    Neurological examination  Mentation: Alert oriented to time, place, history taking. Follows all commands speech and language fluent Cranial nerve II-XII: Pupils were equal round reactive to light. Extraocular movements were full, visual field were full on confrontational test. Facial sensation and strength were normal. Head turning and shoulder shrug  were normal and symmetric. Motor: The motor testing reveals 5 over 5 strength of all 4 extremities. Good symmetric motor tone is noted throughout.  Sensory: Sensory testing is intact to soft touch on all 4 extremities. No evidence of extinction is noted.  Coordination: Cerebellar testing reveals good finger-nose-finger and heel-to-shin bilaterally.  Gait and station: Gait is normal.  Reflexes: Deep tendon reflexes are symmetric and normal bilaterally.    DIAGNOSTIC DATA (LABS, IMAGING, TESTING) - I reviewed patient records, labs, notes, testing and imaging myself where available.  Lab Results  Component Value Date   WBC 5.8 11/14/2021   HGB 13.9 11/14/2021   HCT 40.6 11/14/2021   MCV 87.7 11/14/2021   PLT 332  11/14/2021      Component Value Date/Time   NA 138 11/14/2021 2256   K 3.5 11/14/2021 2256   CL 111 11/14/2021 2256   CO2 15 (L) 11/14/2021 2256   GLUCOSE 90 11/14/2021 2256   BUN 9 11/14/2021 2256   CREATININE 0.75 11/14/2021 2256   CREATININE 0.68 07/06/2013 1600   CALCIUM 8.9 11/14/2021 2256   PROT 8.9 (H) 11/14/2021 2256   ALBUMIN 4.0 11/14/2021 2256   AST 14 (L) 11/14/2021 2256   ALT 10 11/14/2021 2256   ALKPHOS 76 11/14/2021 2256   BILITOT 0.4 11/14/2021 2256   GFRNONAA >60 11/14/2021 2256   GFRAA >60 04/17/2019 0446   No results found for: CHOL, HDL, LDLCALC, LDLDIRECT, TRIG, CHOLHDL No results found for: HGBA1C No results found for: VITAMINB12 Lab Results  Component Value Date   TSH 5.027 (H) 11/14/2021    No flowsheet data found.   No flowsheet data found.   ASSESSMENT AND PLAN  26 y.o. year old female  has a past medical history of Abdominal pain, recurrent, Allergy, Anxiety, Appendicitis, Crohn's disease of colon ([place]) (10/14), Dysmenorrhea, GERD (gastroesophageal reflux disease), and Shortness of breath dyspnea. here with    Idiopathic intracranial hypertension - Plan: CMP, Ambulatory referral to Family Practice  Optic nerve edema - Plan: CMP  Class 2 obesity without serious comorbidity with body mass index (BMI) of 38.0 to 38.9 in adult, unspecified obesity type - Plan: Ambulatory referral to [facility] was unable to tolerate Diamox. I will start fursemide 20 mg tabs. She was advised to begin with 1/2 a tab (41m) daily and then take a full tablet daily, thereafter. Continue with the weight loss and we will refer you to health weight and wellness. Continue close follow up with opthalmology for the optic nerve edema. Follow up in 2-4 weeks for lab work.    Follow up with me in 3-4 months or sooner if needed.   Orders Placed This Encounter  Procedures   CMP    Standing Status:   Future    Standing Expiration Date:   12/22/2022   Ambulatory  referral to Family Practice    Referral Priority:  Routine    Referral Type:   Consultation    Referral Reason:   Specialty Services Required    Referred to Provider:   Starlyn Skeans, MD    Requested Specialty:   Family Medicine    Number of Visits Requested:   1     Meds ordered this encounter  Medications   furosemide (LASIX) 20 MG tablet    Sig: Take 1 tablet (20 mg total) by mouth daily.    Dispense:  30 tablet    Refill:  11      Lexie Morini, MSN, FNP-C 12/22/2021, 12:59 PM  [facility] [address], [place], [place] [address] [phone]

## 2021-12-21 NOTE — Patient Instructions (Addendum)
Below is our plan:  We will begin Furosemide 20 mg tablets. For the first week, take 1/2 a tab (85m), and then take a full tablet thereafter. Continue to stay hydrated while taking this medication, this can cause dehydration and occasional dizziness.   We discussed maintaining frequent follow ups with Ophthalmology and primary care for labs. We recommend returning in 2-4 weeks for kidney function. We would like you to see if you could follow up with the eye doctor in the next 3 months to assess the optic nerve edema.    We discussed seeking immediate medical attention if you experience sudden severe headache that is unusual for you or changes in vision.   Please make sure you are staying well hydrated. I recommend 50-60 ounces daily. Well balanced diet and regular exercise encouraged. Consistent sleep schedule with 6-8 hours recommended.   Please continue follow up with care team as directed.   Follow up with me in 4-6 months if needed.  You may receive a survey regarding today's visit. I encourage you to leave honest feed back as I do use this information to improve patient care. Thank you for seeing me today!

## 2021-12-22 ENCOUNTER — Ambulatory Visit: Payer: BC Managed Care – PPO | Admitting: Family Medicine

## 2021-12-22 ENCOUNTER — Other Ambulatory Visit: Payer: Self-pay

## 2021-12-22 ENCOUNTER — Encounter: Payer: Self-pay | Admitting: Family Medicine

## 2021-12-22 VITALS — BP 122/78 | HR 76 | Ht 67.0 in | Wt 243.6 lb

## 2021-12-22 DIAGNOSIS — G932 Benign intracranial hypertension: Secondary | ICD-10-CM | POA: Diagnosis not present

## 2021-12-22 DIAGNOSIS — E669 Obesity, unspecified: Secondary | ICD-10-CM | POA: Diagnosis not present

## 2021-12-22 DIAGNOSIS — Z6838 Body mass index (BMI) 38.0-38.9, adult: Secondary | ICD-10-CM

## 2021-12-22 DIAGNOSIS — H4710 Unspecified papilledema: Secondary | ICD-10-CM | POA: Diagnosis not present

## 2021-12-22 MED ORDER — FUROSEMIDE 20 MG PO TABS: 20.0000 mg | ORAL_TABLET | Freq: Every day | ORAL | 11 refills | Status: DC

## 2022-01-05 ENCOUNTER — Telehealth: Payer: Self-pay | Admitting: Family Medicine

## 2022-01-05 DIAGNOSIS — H4710 Unspecified papilledema: Secondary | ICD-10-CM | POA: Diagnosis not present

## 2022-01-05 NOTE — Telephone Encounter (Signed)
Called the pt back. Advised the pt that she had just had a LP completed in nov 2022. It is not typical that we would do this routinely or at least this soon after having one. She had not been taken the diamox due to side effects and was just started on lasix at the recent visit on 2/7. She has just reached the dose that Amy wanted her to get to and did notice yesterday that she was starting to void more. I questioned if she has made an apt with the opthalmologist as recommended and she did call them prior to calling us. She is scheduled to see them today at 1 pm. I advised that with her just getting established on the maintenance dose of the medication and having her eye visit today, we should see how that goes. Asked her to advise the ophthalmologist to send [place] records from her visit today and Amy can review those. Pt verbalized understanding. Pt was appreciative for the call back.

## 2022-01-05 NOTE — Telephone Encounter (Signed)
Pt is calling re: her IIH, pt states her vision has been blurry for a few days.  Pt would like a Spinal tap to remove some fluid, pt concerned about her vision, please call

## 2022-01-06 ENCOUNTER — Encounter: Payer: Self-pay | Admitting: Neurology

## 2022-04-06 DIAGNOSIS — H4710 Unspecified papilledema: Secondary | ICD-10-CM | POA: Diagnosis not present

## 2022-04-21 ENCOUNTER — Telehealth: Payer: Self-pay | Admitting: Family Medicine

## 2022-04-21 ENCOUNTER — Ambulatory Visit: Payer: BC Managed Care – PPO | Admitting: Family Medicine

## 2022-04-21 ENCOUNTER — Encounter: Payer: Self-pay | Admitting: Family Medicine

## 2022-04-21 VITALS — BP 108/78 | Temp 98.6°F | Wt 235.4 lb

## 2022-04-21 DIAGNOSIS — M545 Low back pain, unspecified: Secondary | ICD-10-CM

## 2022-04-21 DIAGNOSIS — M25462 Effusion, left knee: Secondary | ICD-10-CM | POA: Diagnosis not present

## 2022-04-21 LAB — POC URINALSYSI DIPSTICK (AUTOMATED)
Bilirubin, UA: NEGATIVE
Glucose, UA: NEGATIVE
Ketones, UA: NEGATIVE
Leukocytes, UA: NEGATIVE
Nitrite, UA: NEGATIVE
Protein, UA: NEGATIVE
Spec Grav, UA: 1.015 (ref 1.010–1.025)
Urobilinogen, UA: 0.2 E.U./dL
pH, UA: 5.5 (ref 5.0–8.0)

## 2022-04-21 NOTE — Telephone Encounter (Signed)
Called pt. Confirmed she is taking lasix 34m po qd. Been on this since Feb. No UTI sx. She takes med before 10/11am. When she goes to bed, feels she has to urinate more. Goes 3-4 times between 9-11pm. Having pain in her lower back that started a week ago. No injuries prior to pain starting. Has always had back problems but this pain is new. Pain located above hips on both sides. She also reports she has fluid on left knee. Developed in the last month. Goes to gym 5 days per week. Has fluid collection in L knee. Denies pain. Looks swollen but it does not bother her. Mother wanted her to have this looked at.    Has not started any other new meds since last seen. Does not drink a lot of fluids. May drink 16 oz water per day. May not be drinking enough? Wondering if this is causing kidney pain?   She confirmed she had 3 month f/u with eye doctor end of May. No changes, about the same since visit 12/2021.

## 2022-04-21 NOTE — Progress Notes (Signed)
   Subjective:    Patient ID: Julie Ward, female    DOB: 1996-03-19, 26 y.o.   MRN: [id number]  HPI Here for 2 issues. First for the past 4 weeks she has had a small area of swelling on the anterior left knee which does not bother her at all. No recent trauma. However she did start working out hard n her gym 5 days a week a few months ago. One of her exercises is leg presses. Also for the past one week she has had intermittent mild low back pains. These do not radiate to the buttocks or legs. She has taken nothing for these pains. She had Xrays of the lumbar spine in 2019 which showed mild dextroscoliosis .    Review of Systems  Constitutional: Negative.   Respiratory: Negative.    Cardiovascular: Negative.   Musculoskeletal:  Positive for arthralgias and back pain.      Objective:   Physical Exam Constitutional:      Appearance: Normal appearance. She is not ill-appearing.  Cardiovascular:     Rate and Rhythm: Normal rate and regular rhythm.     Pulses: Normal pulses.     Heart sounds: Normal heart sounds.  Pulmonary:     Effort: Pulmonary effort is normal.     Breath sounds: Normal breath sounds.  Musculoskeletal:     Comments: She is mildly tender over the lower back with full ROM. There is a small puffy area in the front of the left knee that is not tender. Her ROM of the knee is normal   Neurological:     Mental Status: She is alert.          Assessment & Plan:  She has a small prepatellar bursa effusion on the left knee, and she has mechanical low back pain. The effusion is nothing to worry about and it should resolve. The low back pain is an overuse type of situation. She can use heat or ice as needed, and she can take Ibuprofen as needed. I advised her to take a break from the gym for 2 weeks to allow the areas to heal. Then she should start back slowly and avoid leg lifts or squats.  Alysia Penna, MD

## 2022-04-21 NOTE — Telephone Encounter (Signed)
Pt said, week ago started  having pain in lower back. Level of pain a 7. Having a side effect from the furosemide (LASIX) 20 MG tablet.Called PCP, suggest contact neurologist. Would like a call from the nurse.

## 2022-04-21 NOTE — Telephone Encounter (Signed)
Called and spoke with pt. Relayed Amy's message. She verbalized understanding. She is going to f/u with PCP to have UA/labs checked first to r/o other causes. She will ask them to evaluate knee as well, see if she needs to see ortho for this.

## 2022-05-11 ENCOUNTER — Ambulatory Visit: Payer: BC Managed Care – PPO | Admitting: Family Medicine

## 2022-05-11 ENCOUNTER — Encounter: Payer: Self-pay | Admitting: Family Medicine

## 2022-05-11 VITALS — BP 126/84 | HR 77 | Ht 67.0 in | Wt 236.0 lb

## 2022-05-11 DIAGNOSIS — G932 Benign intracranial hypertension: Secondary | ICD-10-CM

## 2022-05-12 LAB — COMPREHENSIVE METABOLIC PANEL
ALT: 8 IU/L (ref 0–32)
AST: 9 IU/L (ref 0–40)
Albumin/Globulin Ratio: 1.2 (ref 1.2–2.2)
Albumin: 4.2 g/dL (ref 3.9–5.0)
Alkaline Phosphatase: 78 IU/L (ref 44–121)
BUN/Creatinine Ratio: 9 (ref 9–23)
BUN: 8 mg/dL (ref 6–20)
Bilirubin Total: 0.3 mg/dL (ref 0.0–1.2)
CO2: 19 mmol/L — ABNORMAL LOW (ref 20–29)
Calcium: 9 mg/dL (ref 8.7–10.2)
Chloride: 104 mmol/L (ref 96–106)
Creatinine, Ser: 0.86 mg/dL (ref 0.57–1.00)
Globulin, Total: 3.4 g/dL (ref 1.5–4.5)
Glucose: 96 mg/dL (ref 70–99)
Potassium: 4.1 mmol/L (ref 3.5–5.2)
Sodium: 138 mmol/L (ref 134–144)
Total Protein: 7.6 g/dL (ref 6.0–8.5)
eGFR: 95 mL/min/{1.73_m2} (ref >59)

## 2022-08-24 DIAGNOSIS — G932 Benign intracranial hypertension: Secondary | ICD-10-CM | POA: Diagnosis not present

## 2022-09-28 DIAGNOSIS — Z1159 Encounter for screening for other viral diseases: Secondary | ICD-10-CM | POA: Diagnosis not present

## 2022-09-28 DIAGNOSIS — Z113 Encounter for screening for infections with a predominantly sexual mode of transmission: Secondary | ICD-10-CM | POA: Diagnosis not present

## 2022-09-28 DIAGNOSIS — Z01419 Encounter for gynecological examination (general) (routine) without abnormal findings: Secondary | ICD-10-CM | POA: Diagnosis not present

## 2022-09-28 DIAGNOSIS — Z114 Encounter for screening for human immunodeficiency virus [HIV]: Secondary | ICD-10-CM | POA: Diagnosis not present

## 2022-11-02 ENCOUNTER — Ambulatory Visit (INDEPENDENT_AMBULATORY_CARE_PROVIDER_SITE_OTHER): Payer: BC Managed Care – PPO | Admitting: Family Medicine

## 2022-11-02 ENCOUNTER — Encounter: Payer: Self-pay | Admitting: Family Medicine

## 2022-11-02 VITALS — BP 110/70 | Temp 98.1°F | Wt 241.0 lb

## 2022-11-02 DIAGNOSIS — J020 Streptococcal pharyngitis: Secondary | ICD-10-CM

## 2022-11-02 DIAGNOSIS — J029 Acute pharyngitis, unspecified: Secondary | ICD-10-CM | POA: Diagnosis not present

## 2022-11-02 LAB — POCT RAPID STREP A (OFFICE): Rapid Strep A Screen: POSITIVE — AB

## 2022-11-02 MED ORDER — CEFUROXIME AXETIL 500 MG PO TABS: 500.0000 mg | ORAL_TABLET | Freq: Two times a day (BID) | ORAL | 0 refills | Status: AC

## 2022-11-02 NOTE — Progress Notes (Signed)
   Subjective:    Patient ID: Julie Ward, female    DOB: 1996/03/31, 26 y.o.   MRN: [id number]  HPI Here for 4 days of low grade fever and ST. No coughing or body aches. She tested negative for Covid at home.    Review of Systems  Constitutional:  Positive for fever.  HENT:  Positive for congestion, postnasal drip and sore throat. Negative for ear pain.   Eyes: Negative.   Respiratory: Negative.         Objective:   Physical Exam Constitutional:      Appearance: Normal appearance.  HENT:     Right Ear: Tympanic membrane, ear canal and external ear normal.     Left Ear: Tympanic membrane, ear canal and external ear normal.     Nose: Nose normal.     Mouth/Throat:     Pharynx: Oropharynx is clear.  Eyes:     Conjunctiva/sclera: Conjunctivae normal.  Pulmonary:     Effort: Pulmonary effort is normal.     Breath sounds: Normal breath sounds.  Lymphadenopathy:     Cervical: No cervical adenopathy.  Neurological:     Mental Status: She is alert.           Assessment & Plan:  Strep pharyngitis, treat with 10 days of Cefuroxime.  Alysia Penna, MD

## 2022-11-11 ENCOUNTER — Ambulatory Visit: Payer: BC Managed Care – PPO | Admitting: Family Medicine

## 2022-11-23 ENCOUNTER — Ambulatory Visit: Payer: BC Managed Care – PPO | Admitting: Family Medicine

## 2022-12-15 DIAGNOSIS — Z3141 Encounter for fertility testing: Secondary | ICD-10-CM | POA: Diagnosis not present

## 2022-12-27 ENCOUNTER — Other Ambulatory Visit: Payer: Self-pay | Admitting: *Deleted

## 2022-12-27 MED ORDER — FUROSEMIDE 20 MG PO TABS: 20.0000 mg | ORAL_TABLET | Freq: Every day | ORAL | 3 refills | Status: DC

## 2022-12-27 NOTE — Telephone Encounter (Signed)
Last seen on 05/11/22  Follow up scheduled on 03/24/23

## 2023-02-16 DIAGNOSIS — K5090 Crohn's disease, unspecified, without complications: Secondary | ICD-10-CM | POA: Diagnosis not present

## 2023-02-16 DIAGNOSIS — Z3169 Encounter for other general counseling and advice on procreation: Secondary | ICD-10-CM | POA: Diagnosis not present

## 2023-02-16 DIAGNOSIS — E041 Nontoxic single thyroid nodule: Secondary | ICD-10-CM | POA: Diagnosis not present

## 2023-02-16 DIAGNOSIS — G932 Benign intracranial hypertension: Secondary | ICD-10-CM | POA: Diagnosis not present

## 2023-03-02 NOTE — Progress Notes (Unsigned)
No chief complaint on file.    HISTORY OF PRESENT ILLNESS:  03/02/23 ALL:  Julie Ward returns for follow up for IIH. She continues furosemide  daily.   05/11/2022 ALL: Julie Ward returns for follow up for IIH. She continues furosemide and tolerating well. She reports headaches are rare. She has blurred vision at times but denies loss of vision. She was seen by ophthalmology last month and reports exam was stable. No significant improvement but no worsening. She is working on weight loss efforts. She is down 7 pounds since 12/2021. She had trouble tolerating 64 ounces of water and usually drinks about 24 ounces daily,   12/22/2021 ALL: Julie Ward is a 27 y.o. female here today for follow up for IIH. Opening pressure 32. Bilateral papilledema with ophthalmology exam. She was previously started on Diamox 250 mg, she has been reporting feeling sick on it and is unable to tolerate it. Dr. Epimenio Foot advised trying furosemide 20 mg. At this time she denies having any headaches and only having occasional pressure behind the eyes in the mornings. No vision changes or vision loss. She is working on weight loss at this time and is interested in seeing health weight and wellness. She sees [facility] for her eye exams and will continue close follow up with them.   HISTORY (copied from Dr Bonnita Hollow previous note)  I had the pleasure of seeing your patient, Julie Ward, at [facility] for neurologic consultation regarding her optic disc edema and concern about idiopathic intracranial hypertension.   She is a 27 year old woman who was found on routine eye exam to have mild optic nerve edema bilaterally.     She saw Dr. Marylouise Stacks for a routine eye exam (telemedicine with a technician live with the patient) who noted optic disc edema on routine eye exam.  OCT RNFL confirmed optic disc edema and no evidence of drusen.  She saw her ophthalmology 08/21/2021.  She was referred to Dr.  Nada Libman who confirmed the bilateral optic nerve edema, grade 1.   Visual acuity was 20/20.  Intraocular pressures were normal (16 OD and OS).  She denies any visual field issues.   She denies eye pain or frequent headache.  She denies pulsatile tinnitus.   Rarely, she feels lightheaded if she stands up fast.   No diplopia.      She is referred for further evaluation and treatment.   She used to weight 277 and no weighs 255.       She gets occasional mild headaches that resolve on their own.  She presented to the [facility] emergency room 05/01/2020 due to a severe headache lasting 4 days.  She had a CT scan at [facility].  05/01/2020.  It was read as showing no acute intracranial abnormality.  The pituitary is not mentioned on the report.     REVIEW OF SYSTEMS: Out of a complete 14 system review of symptoms, the patient complains only of the following symptoms, blurred vision and all other reviewed systems are negative.   ALLERGIES: Allergies  Allergen Reactions   Penicillins Shortness Of Breath, Nausea Only and Other (See Comments)    Made her sick/Childhood Allergy Has patient had a PCN reaction causing immediate rash, facial/tongue/throat swelling, SOB or lightheadedness with hypotension:yes Has patient had a PCN reaction causing severe rash involving mucus membranes or skin necrosis: No Has patient had a PCN reaction that required hospitalization: No Has patient had a PCN reaction occurring within the last 10 years:No .  Fentanyl Other (See Comments)    anxiety   Morphine And Related     Caused stomach to burn    Oxycontin [Oxycodone Hcl] Nausea And Vomiting    vomiting   Prednisone Hives   Valium [Diazepam]     Hyperactivity      HOME MEDICATIONS: Outpatient Medications Prior to Visit  Medication Sig Dispense Refill   furosemide (LASIX) 20 MG tablet Take 1 tablet (20 mg total) by mouth daily. 30 tablet 3   No facility-administered medications prior to visit.     PAST MEDICAL  HISTORY: Past Medical History:  Diagnosis Date   Abdominal pain, recurrent    Allergy    Anxiety    Appendicitis    Crohn's disease of colon (HCC) 10/14   sees Dr. Cheryll Dessert at [facility]    Dysmenorrhea    GERD (gastroesophageal reflux disease)    not taking any medication   Shortness of breath dyspnea    ith thyroid problem     PAST SURGICAL HISTORY: Past Surgical History:  Procedure Laterality Date   APPENDECTOMY  07-14-11   laparoscopic    CHOLECYSTECTOMY  02/11/2012   Procedure: LAPAROSCOPIC CHOLECYSTECTOMY WITH INTRAOPERATIVE CHOLANGIOGRAM;  Surgeon: Cherylynn Ridges, MD;  Location: [facility] OR;  Service: General;  Laterality: N/A;   ESOPHAGOGASTRODUODENOSCOPY  01/28/2012   Procedure: ESOPHAGOGASTRODUODENOSCOPY (EGD);  Surgeon: Jon Gills, MD;  Location: [facility] OR;  Service: Gastroenterology;  Laterality: N/A;   THYROID LOBECTOMY Left 04/29/2015   Procedure: LEFT TOTAL THYROID LOBECTOMY;  Surgeon: Jimmye Norman, MD;  Location: [facility] OR;  Service: General;  Laterality: Left;   WISDOM TOOTH EXTRACTION       FAMILY HISTORY: Family History  Problem Relation Age of Onset   Cancer Paternal Aunt        ovarian, kidney   Cancer Paternal Uncle        colon   Colon cancer Paternal Uncle    Cancer Maternal Grandmother        breast    Hypertension Maternal Grandmother    Irritable bowel syndrome Maternal Grandmother    Heart disease Maternal Grandmother    Hypertension Paternal Grandmother    Hyperlipidemia Paternal Grandmother      SOCIAL HISTORY: Social History   Socioeconomic History   Marital status: Single    Spouse name: Not on file   Number of children: 0   Years of education: 14   Highest education level: Not on file  Occupational History   Occupation: [employer]   Occupation: [employer]  Tobacco Use   Smoking status: Never   Smokeless tobacco: Never  Building services engineer Use: Never used  Substance and Sexual Activity   Alcohol use: Yes    Comment: weekends    Drug use: No   Sexual activity: Never  Other Topics Concern   Not on file  Social History Narrative   Right handed   Caffeine use: coffee/soda (coffee 2-3 times per week, soda daily)   Social Determinants of Health   Financial Resource Strain: Not on file  Food Insecurity: Not on file  Transportation Needs: Not on file  Physical Activity: Not on file  Stress: Not on file  Social Connections: Not on file  Intimate Partner Violence: Not on file     PHYSICAL EXAM  There were no vitals filed for this visit.  There is no height or weight on file to calculate BMI.  Generalized: Well developed, in no acute distress  Cardiology: normal  rate and rhythm, no murmur auscultated  Respiratory: clear to auscultation bilaterally    Neurological examination  Mentation: Alert oriented to time, place, history taking. Follows all commands speech and language fluent Cranial nerve II-XII: Pupils were equal round reactive to light. Extraocular movements were full, visual field were full on confrontational test. Facial sensation and strength were normal. Head turning and shoulder shrug  were normal and symmetric. Motor: The motor testing reveals 5 over 5 strength of all 4 extremities. Good symmetric motor tone is noted throughout.  Gait: normal   DIAGNOSTIC DATA (LABS, IMAGING, TESTING) - I reviewed patient records, labs, notes, testing and imaging myself where available.  Lab Results  Component Value Date   WBC 5.8 11/14/2021   HGB 13.9 11/14/2021   HCT 40.6 11/14/2021   MCV 87.7 11/14/2021   PLT 332 11/14/2021      Component Value Date/Time   NA 138 05/11/2022 1030   K 4.1 05/11/2022 1030   CL 104 05/11/2022 1030   CO2 19 (L) 05/11/2022 1030   GLUCOSE 96 05/11/2022 1030   GLUCOSE 90 11/14/2021 2256   BUN 8 05/11/2022 1030   CREATININE 0.86 05/11/2022 1030   CREATININE 0.68 07/06/2013 1600   CALCIUM 9.0 05/11/2022 1030   PROT 7.6 05/11/2022 1030   ALBUMIN 4.2 05/11/2022 1030    AST 9 05/11/2022 1030   ALT 8 05/11/2022 1030   ALKPHOS 78 05/11/2022 1030   BILITOT 0.3 05/11/2022 1030   GFRNONAA >60 11/14/2021 2256   GFRAA >60 04/17/2019 0446   No results found for: "CHOL", "HDL", "LDLCALC", "LDLDIRECT", "TRIG", "CHOLHDL" No results found for: "HGBA1C" No results found for: "VITAMINB12" Lab Results  Component Value Date   TSH 5.027 (H) 11/14/2021        No data to display               No data to display           ASSESSMENT AND PLAN  27 y.o. year old female  has a past medical history of Abdominal pain, recurrent, Allergy, Anxiety, Appendicitis, Crohn's disease of colon (HCC) (10/14), Dysmenorrhea, GERD (gastroesophageal reflux disease), and Shortness of breath dyspnea. here with    No diagnosis found.  Julie Ward is doing well. We will continue furosemide  daily. I will update CMP. Continue with the weight loss efforts and healthy lifestyle habits. Continue close follow up with opthalmology for the optic nerve edema. Follow up in 6 months.  No orders of the defined types were placed in this encounter.    No orders of the defined types were placed in this encounter.     Shawnie Dapper, MSN, FNP-C 03/02/2023, 10:30 AM  [facility] [address], [address] [place], [place] [address] [phone]

## 2023-03-02 NOTE — Patient Instructions (Incomplete)
Below is our plan:  We will stop Lasix pending normal eye exam tomorrow. I recommend taking Lasix every other day for 1-2 weeks then stop. Please monitor symptoms closely and follow up with ophthalmology regularly.   Please make sure you are staying well hydrated. I recommend 50-60 ounces daily. Well balanced diet and regular exercise encouraged. Consistent sleep schedule with 6-8 hours recommended.   Please continue follow up with care team as directed.   Follow up with me in 1 year   You may receive a survey regarding today's visit. I encourage you to leave honest feed back as I do use this information to improve patient care. Thank you for seeing me today!

## 2023-03-03 ENCOUNTER — Encounter: Payer: Self-pay | Admitting: Family Medicine

## 2023-03-03 ENCOUNTER — Ambulatory Visit: Payer: BC Managed Care – PPO | Admitting: Family Medicine

## 2023-03-03 VITALS — BP 115/76 | HR 88 | Ht 67.5 in | Wt 232.0 lb

## 2023-03-03 DIAGNOSIS — G932 Benign intracranial hypertension: Secondary | ICD-10-CM

## 2023-03-04 DIAGNOSIS — G932 Benign intracranial hypertension: Secondary | ICD-10-CM | POA: Diagnosis not present

## 2023-03-23 DIAGNOSIS — Z3169 Encounter for other general counseling and advice on procreation: Secondary | ICD-10-CM | POA: Diagnosis not present

## 2023-03-23 DIAGNOSIS — K5090 Crohn's disease, unspecified, without complications: Secondary | ICD-10-CM | POA: Diagnosis not present

## 2023-03-23 DIAGNOSIS — G932 Benign intracranial hypertension: Secondary | ICD-10-CM | POA: Diagnosis not present

## 2023-03-23 DIAGNOSIS — E041 Nontoxic single thyroid nodule: Secondary | ICD-10-CM | POA: Diagnosis not present

## 2023-03-24 ENCOUNTER — Ambulatory Visit: Payer: BC Managed Care – PPO | Admitting: Family Medicine

## 2023-04-21 ENCOUNTER — Ambulatory Visit: Payer: BC Managed Care – PPO | Admitting: Family Medicine

## 2023-04-21 ENCOUNTER — Encounter: Payer: Self-pay | Admitting: Family Medicine

## 2023-04-21 VITALS — BP 112/86 | HR 85 | Temp 98.6°F | Wt 242.2 lb

## 2023-04-21 DIAGNOSIS — M7021 Olecranon bursitis, right elbow: Secondary | ICD-10-CM | POA: Diagnosis not present

## 2023-04-21 DIAGNOSIS — M25521 Pain in right elbow: Secondary | ICD-10-CM

## 2023-04-21 NOTE — Progress Notes (Signed)
Established Patient Office Visit   Subjective  Patient ID: Julie Ward, female    DOB: October 27, 1996  Age: 27 y.o. MRN: [id number]  Chief Complaint  Patient presents with   Elbow Injury    Woke up Monday morning and rt elbow was a little painful, but as the day went on it stared to get more painful and started to stiffen uo. Yesterday noticed swelling above the elbow, and not able to stretch out her arm. Took 4 tylenol yesterday and barely took the edge off, has alternated heat and ice, but nothing seems to help with pain    Patient is a 27 year old female followed by Dr. Clent Ridges and seen for acute concern.  Patient endorses waking up with right elbow pain 4 days ago.  Patient denies change in activity, injury prior to start of symptoms.  Patient typically eats fairly healthy.  Did have a seafood boil a few weeks ago for the first time in a while.  Patient endorses typing 11 hours/day at work.  Tried Tylenol, heat, and ice for symptoms.    Past Medical History:  Diagnosis Date   Abdominal pain, recurrent    Allergy    Anxiety    Appendicitis    Crohn's disease of colon (HCC) 10/14   sees Dr. Cheryll Dessert at [facility]    Dysmenorrhea    GERD (gastroesophageal reflux disease)    not taking any medication   Shortness of breath dyspnea    ith thyroid problem   Past Surgical History:  Procedure Laterality Date   APPENDECTOMY  07-14-11   laparoscopic    CHOLECYSTECTOMY  02/11/2012   Procedure: LAPAROSCOPIC CHOLECYSTECTOMY WITH INTRAOPERATIVE CHOLANGIOGRAM;  Surgeon: Cherylynn Ridges, MD;  Location: [facility] OR;  Service: General;  Laterality: N/A;   ESOPHAGOGASTRODUODENOSCOPY  01/28/2012   Procedure: ESOPHAGOGASTRODUODENOSCOPY (EGD);  Surgeon: Jon Gills, MD;  Location: [facility] OR;  Service: Gastroenterology;  Laterality: N/A;   THYROID LOBECTOMY Left 04/29/2015   Procedure: LEFT TOTAL THYROID LOBECTOMY;  Surgeon: Jimmye Norman, MD;  Location: [facility] OR;  Service: General;  Laterality: Left;   WISDOM  TOOTH EXTRACTION     Social History   Tobacco Use   Smoking status: Never   Smokeless tobacco: Never  Vaping Use   Vaping Use: Never used  Substance Use Topics   Alcohol use: Yes    Comment: weekends   Drug use: No   Family History  Problem Relation Age of Onset   Cancer Paternal Aunt        ovarian, kidney   Cancer Paternal Uncle        colon   Colon cancer Paternal Uncle    Cancer Maternal Grandmother        breast    Hypertension Maternal Grandmother    Irritable bowel syndrome Maternal Grandmother    Heart disease Maternal Grandmother    Hypertension Paternal Grandmother    Hyperlipidemia Paternal Grandmother    Allergies  Allergen Reactions   Penicillins Shortness Of Breath, Nausea Only and Other (See Comments)    Made her sick/Childhood Allergy Has patient had a PCN reaction causing immediate rash, facial/tongue/throat swelling, SOB or lightheadedness with hypotension:yes Has patient had a PCN reaction causing severe rash involving mucus membranes or skin necrosis: No Has patient had a PCN reaction that required hospitalization: No Has patient had a PCN reaction occurring within the last 10 years:No .   Fentanyl Other (See Comments)    anxiety   Morphine And Codeine  Caused stomach to burn    Oxycontin [Oxycodone Hcl] Nausea And Vomiting    vomiting   Prednisone Hives   Valium [Diazepam]     Hyperactivity       ROS Negative unless stated above    Objective:     BP 112/86 (BP Location: Left Arm, Patient Position: Sitting, Cuff Size: Large)   Pulse 85   Temp 98.6 F (37 C) (Oral)   Wt 242 lb 3.2 oz (109.9 kg)   BMI 37.37 kg/m    Physical Exam Constitutional:      General: She is not in acute distress.    Appearance: Normal appearance.  HENT:     Head: Normocephalic and atraumatic.     Nose: Nose normal.     Mouth/Throat:     Mouth: Mucous membranes are moist.  Cardiovascular:     Rate and Rhythm: Normal rate and regular rhythm.      Heart sounds: Normal heart sounds. No murmur heard.    No gallop.  Pulmonary:     Effort: Pulmonary effort is normal. No respiratory distress.     Breath sounds: Normal breath sounds. No wheezing, rhonchi or rales.  Musculoskeletal:     Right elbow: Swelling and effusion present. Decreased range of motion. Tenderness present in medial epicondyle and olecranon process.     Left elbow: Normal.     Right forearm: Normal.     Left forearm: Normal.     Right wrist: Normal.     Left wrist: Normal.     Right hand: Normal.     Left hand: Normal.     Comments: Right elbow wrapped with Ace bandage.  Skin:    General: Skin is warm and dry.     Comments: No increased warmth of right elbow.  Neurological:     Mental Status: She is alert and oriented to person, place, and time.      No results found for any visits on 04/21/23.    Assessment & Plan:  Olecranon bursitis of right elbow  Right elbow pain  R olecranon bursitis.  Also consider gout or pseudogout.  Septic arthritis less likely.  Discussed supportive care including compression, Tylenol, ice, rest.  Right elbow wrapped with Ace bandage in clinic.  Patient with allergy to prednisone, causes hives and unable to take NSAIDs.  For continued or worsen symptoms discussed drainage.  Return if symptoms worsen or fail to improve.   Deeann Saint, MD

## 2023-04-21 NOTE — Patient Instructions (Signed)
An Ace bandage to wrap your elbow.  You can also use Aspercreme and Tylenol to help with symptoms.

## 2023-04-22 ENCOUNTER — Ambulatory Visit: Payer: BC Managed Care – PPO | Admitting: Family Medicine

## 2023-05-04 ENCOUNTER — Encounter: Payer: Self-pay | Admitting: Obstetrics and Gynecology

## 2023-06-01 ENCOUNTER — Telehealth: Payer: Self-pay | Admitting: Family Medicine

## 2023-06-01 NOTE — Telephone Encounter (Signed)
I called pt.  She is asking for restart of lasix since she has placed IVF on hold for right now.  She has noted for the last 2 weeks, headaches, no vision issues.  Last saw her [place] ophthal[facility] in April everything looked good .  She sees them yearly and prn.  She has appt with you 03-09-2023 yearly.  No changes in medications since last seen.  [facility]

## 2023-06-01 NOTE — Telephone Encounter (Signed)
Pt stated she wants to talk to Amy about restarting her medication. Stated she is starting to have headaches again.

## 2023-06-02 NOTE — Telephone Encounter (Signed)
Per Amy, NP she relayed to :  Let's bring her in to discuss. We would need to update labs prior to restarting.   I offered to her appt for today at 1500. Pt stated she started new job and cannot do any appt  for the next 90 days.  Not even VV.  (But would have to come in for labs prior to starting medication).  I offered to make another appt for her later on and she no thank you right now.

## 2023-07-05 ENCOUNTER — Ambulatory Visit: Payer: BC Managed Care – PPO | Admitting: Family Medicine

## 2023-07-05 ENCOUNTER — Encounter: Payer: Self-pay | Admitting: Family Medicine

## 2023-07-05 VITALS — BP 110/70 | Temp 98.1°F | Wt 242.0 lb

## 2023-07-05 DIAGNOSIS — G932 Benign intracranial hypertension: Secondary | ICD-10-CM | POA: Insufficient documentation

## 2023-07-05 DIAGNOSIS — G8929 Other chronic pain: Secondary | ICD-10-CM

## 2023-07-05 DIAGNOSIS — M25521 Pain in right elbow: Secondary | ICD-10-CM | POA: Diagnosis not present

## 2023-07-05 MED ORDER — FUROSEMIDE 20 MG PO TABS: 20.0000 mg | ORAL_TABLET | Freq: Every day | ORAL | 3 refills | Status: DC

## 2023-07-05 NOTE — Progress Notes (Signed)
   Subjective:    Patient ID: Julie Ward, female    DOB: [birth year], 27 y.o.   MRN: [id number]  HPI Here for right elbow pain. This started about 3 months ago, and she relates it to her work. 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 The pain she feels is centered in the right elbow and it sometimes radiates down the forearm. No numbness or weakness in the hand. She was sen here in June and was diagnosed with olecranon bursitis. She has applied ice, taken Tylenol, and worn a support sleeve, but nothing has helped. She also has arranged for [place] to get an FMLA form to fill out to cover her for work absences. Sometimes the pain is so bad she cannot come to work for a few days.    Review of Systems  Constitutional: Negative.   Respiratory: Negative.    Cardiovascular: Negative.   Musculoskeletal:  Positive for arthralgias.       Objective:   Physical Exam Constitutional:      General: She is not in acute distress.    Appearance: Normal appearance.  Cardiovascular:     Rate and Rhythm: Normal rate and regular rhythm.     Pulses: Normal pulses.     Heart sounds: Normal heart sounds.  Pulmonary:     Effort: Pulmonary effort is normal.     Breath sounds: Normal breath sounds.  Musculoskeletal:     Comments: Right elbow is not swollen or warm. ROM is full. She is tender over the olecranon and over the ulnar nerve channel   Neurological:     Mental Status: She is alert.           Assessment & Plan:  Chronic right elbow pain. I think this is more consistent with an ulnar neuropathy. I suggested she arrange her work space such that her elbows are hanging off the desk top or else that she use a standing desk to get pressure off the elbows. When we get the FMLA forms we will arrange for her to take intermittent leave for up to 3 episodes per month and up to 3 days per episode. We will also refer her to Orthopedics.  Gershon Crane, MD

## 2023-07-19 DIAGNOSIS — M25521 Pain in right elbow: Secondary | ICD-10-CM | POA: Diagnosis not present

## 2023-07-22 ENCOUNTER — Encounter: Payer: Self-pay | Admitting: Family Medicine

## 2023-07-22 DIAGNOSIS — M25521 Pain in right elbow: Secondary | ICD-10-CM | POA: Insufficient documentation

## 2023-07-25 ENCOUNTER — Telehealth: Payer: Self-pay

## 2023-07-25 NOTE — Telephone Encounter (Signed)
Pt FMLA form and office notes was faxed on 07/22/23. Copy sent to scanning

## 2023-07-27 NOTE — Telephone Encounter (Addendum)
Pt is calling and need FMLA to be edited to say it was ok for her to be out of work until 07-19-2023 due to waiting on specialist . Pt saw the specialst on 07-19-2023

## 2023-07-28 NOTE — Telephone Encounter (Signed)
Please advise, pt form does not state return to work date

## 2023-07-28 NOTE — Telephone Encounter (Signed)
Spoke with [place] with [employer] advised of the intended return to work date for pt verbalized understanding

## 2023-07-28 NOTE — Telephone Encounter (Signed)
Tresa Endo with [employer] [phone] ext [phone]  Called to confirm the date Pt is to return to work.  Please return call at your earliest convenience.

## 2023-07-28 NOTE — Telephone Encounter (Signed)
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

## 2023-07-29 DIAGNOSIS — Z0279 Encounter for issue of other medical certificate: Secondary | ICD-10-CM

## 2023-07-29 NOTE — Telephone Encounter (Signed)
[employer] called again, stating that they just spoke to Pt and Pt wants to return to work part-time. [employer] informed Pt that this would be up to MD.  [employer] just sent a new form, via fax, to be amended by MD.  Valinda Hoar can be found in MD's eFax folder.

## 2023-07-29 NOTE — Telephone Encounter (Signed)
The corrected form is ready

## 2023-07-29 NOTE — Telephone Encounter (Signed)
Please advise 

## 2023-08-04 NOTE — Telephone Encounter (Signed)
Pt form was faxed on 08/04/23. Copy sent to scanning

## 2023-08-11 ENCOUNTER — Telehealth: Payer: Self-pay | Admitting: Family Medicine

## 2023-08-11 NOTE — Telephone Encounter (Signed)
error 

## 2023-09-21 ENCOUNTER — Encounter: Payer: Self-pay | Admitting: Family Medicine

## 2023-09-21 ENCOUNTER — Ambulatory Visit: Payer: BC Managed Care – PPO | Admitting: Family Medicine

## 2023-09-21 VITALS — BP 106/74 | Temp 98.2°F | Wt 244.0 lb

## 2023-09-21 DIAGNOSIS — M25521 Pain in right elbow: Secondary | ICD-10-CM | POA: Diagnosis not present

## 2023-09-21 DIAGNOSIS — G8929 Other chronic pain: Secondary | ICD-10-CM

## 2023-09-21 NOTE — Progress Notes (Signed)
   Subjective:    Patient ID: Julie Ward, female    DOB: 1996/08/23, 27 y.o.   MRN: [id number]  HPI Here to follow up on chronic right elbow pain. We saw her on 07-05-23 for this, and we referred her to see [facility]. She saw Dr. Bradly Bienenstock, who got Xrays of the elbow which were normal. He suggested she wear an elastic support sleeve and told her to follow up with [place]. She has been working on restrictions of 20 hours per week since then. She says the pain has stayed the same, and nothing has improved. She is taking Tylenol and applying heat. She is scheduled to return to working full time tomorrow, but she says she is not ready for this. The elbow still has swelling and pain every day.    Review of Systems  Constitutional: Negative.   Respiratory: Negative.    Cardiovascular: Negative.   Musculoskeletal:  Positive for arthralgias.       Objective:   Physical Exam Constitutional:      Appearance: Normal appearance.  Cardiovascular:     Rate and Rhythm: Normal rate and regular rhythm.     Pulses: Normal pulses.     Heart sounds: Normal heart sounds.  Pulmonary:     Effort: Pulmonary effort is normal.     Breath sounds: Normal breath sounds.  Musculoskeletal:     Comments: Right elbow is not swollen, but she is tender along the lateral epicondyle and over the olecranon. ROM is full.   Neurological:     Mental Status: She is alert.           Assessment & Plan:  She has chronic right elbow pain, and I think she would benefit from more aggressive therapy. We will refer her to [facility] for another opinion. In the meantime we will continue her current work restrictions until 10-24-23. I also suggested she apply Voltaren gel up to 4 times a day as needed.  Gershon Crane, MD

## 2023-09-28 ENCOUNTER — Encounter (HOSPITAL_BASED_OUTPATIENT_CLINIC_OR_DEPARTMENT_OTHER): Payer: Self-pay | Admitting: Emergency Medicine

## 2023-09-28 ENCOUNTER — Other Ambulatory Visit (HOSPITAL_BASED_OUTPATIENT_CLINIC_OR_DEPARTMENT_OTHER): Payer: Self-pay

## 2023-09-28 ENCOUNTER — Emergency Department (HOSPITAL_BASED_OUTPATIENT_CLINIC_OR_DEPARTMENT_OTHER)
Admission: EM | Admit: 2023-09-28 | Discharge: 2023-09-28 | Disposition: A | Discharge status: Home / Self Care | Payer: BC Managed Care – PPO | Attending: Emergency Medicine | Admitting: Emergency Medicine

## 2023-09-28 ENCOUNTER — Other Ambulatory Visit: Payer: Self-pay

## 2023-09-28 DIAGNOSIS — L299 Pruritus, unspecified: Secondary | ICD-10-CM

## 2023-09-28 MED ORDER — FAMOTIDINE 20 MG PO TABS
20.0000 mg | ORAL_TABLET | Freq: Two times a day (BID) | ORAL | 0 refills | Status: DC
  Filled 2023-09-28: qty 30, 15d supply, fill #0

## 2023-09-28 MED ORDER — FAMOTIDINE 20 MG PO TABS: 20.0000 mg | ORAL_TABLET | Freq: Two times a day (BID) | ORAL | 0 refills | Status: DC

## 2023-09-28 NOTE — ED Notes (Signed)
Reviewed AVS/discharge instruction with patient. Time allotted for and all questions answered. Patient is agreeable for d/c and escorted to ed exit by staff.  

## 2023-09-28 NOTE — ED Triage Notes (Signed)
Pt c/o rash on RT side of face x 6 days pta, has resolved but now has itching "all over". Reports rash on BUE and BLE. Denies change in diet, detergents or soaps

## 2023-09-28 NOTE — Discharge Instructions (Addendum)
Evaluation today was overall reassuring.  I did send some Pepcid to your pharmacy.  I still would consider inspecting her room in bed and furniture in her house but otherwise recommend you follow-up with PCP if your symptoms persist.

## 2023-09-28 NOTE — ED Provider Notes (Signed)
EMERGENCY DEPARTMENT AT [facility] Provider Note   CSN: [id number] Arrival date & time: 09/28/23  0913     History  Chief Complaint  Patient presents with   Pruritis   HPI Julie Ward is a 27 y.o. female presenting for pruritus.  States she had a rash on the right side of her face 6 days ago but that has resolved but now endorsing itching in her upper and lower extremities and feels like she has a diffuse rash on her arms and legs.  She states the rash is improving also but the itchiness is notable.  She states that the itching is definitely worse at night when she is trying to sleep.  Denies any new detergents, fabrics or medications.  States she has been taking Benadryl intermittently which has helped minimally.  Patient states she is allergic to steroids.  Denies any rash on her palms of her hands or soles of her feet.  States she has not been sexually active since February.  HPI     Home Medications Prior to Admission medications   Medication Sig Start Date End Date Taking? Authorizing Provider  famotidine (PEPCID) 20 MG tablet Take 1 tablet (20 mg total) by mouth 2 (two) times daily. 09/28/23  Yes Gareth Eagle, PA-C  furosemide (LASIX) 20 MG tablet Take 1 tablet (20 mg total) by mouth daily. 07/05/23   Nelwyn Salisbury, MD      Allergies    Penicillins, Fentanyl, Morphine and codeine, Oxycontin [oxycodone hcl], Prednisone, and Valium [diazepam]    Review of Systems   See HPI for pertinent positives  Physical Exam Updated Vital Signs BP 137/84 (BP Location: Right Arm)   Pulse 67   Temp 98.5 F (36.9 C)   Resp 17   LMP 09/14/2023   SpO2 98%  Physical Exam Constitutional:      Appearance: Normal appearance.  HENT:     Head: Normocephalic.     Nose: Nose normal.  Eyes:     Conjunctiva/sclera: Conjunctivae normal.  Pulmonary:     Effort: Pulmonary effort is normal.  Skin:    Comments: Scant tiny bite-like lesions on the arms and legs.   No associated surrounding erythema or edema.   Neurological:     Mental Status: She is alert.  Psychiatric:        Mood and Affect: Mood normal.     ED Results / Procedures / Treatments   Labs (all labs ordered are listed, but only abnormal results are displayed) Labs Reviewed - No data to display  EKG None  Radiology No results found.  Procedures Procedures    Medications Ordered in ED Medications - No data to display  ED Course/ Medical Decision Making/ A&P                                 Medical Decision Making  27 year old well-appearing female presenting for pruritus.  Exam notable for what appeared to be small bite-like lesions on her arms and legs.  Given that her itching is worse at night I am concerned for possible bedbugs or other infestation.  History and findings today do not support SJS, meningitis or syphilis.  Advised to inspect her room in bed at home for bedbugs.  Discussed follow-up with her PCP.  Sent Pepcid to her pharmacy but have low suspicion that this is an allergic reaction.  Vital stable.  Discharged in  good condition.        Final Clinical Impression(s) / ED Diagnoses Final diagnoses:  Pruritus    Rx / DC Orders ED Discharge Orders          Ordered    famotidine (PEPCID) 20 MG tablet  2 times daily        09/28/23 1135              Gareth Eagle, PA-C 09/28/23 1135    Ernie Avena, MD 09/28/23 1527

## 2023-10-18 ENCOUNTER — Telehealth: Payer: Self-pay

## 2023-10-18 NOTE — Telephone Encounter (Signed)
Transition Care Management Unsuccessful Follow-up Telephone Call  Date of discharge and from where:  09/28/2023 [facility]  Attempts:  1st Attempt  Reason for unsuccessful TCM follow-up call:  Left voice message    Shelisha Gautier [facility]  [facility], [facility] Guide Direct Dial: [phone]  Website: [email or web address]

## 2023-10-19 ENCOUNTER — Telehealth: Payer: Self-pay

## 2023-10-19 NOTE — Telephone Encounter (Signed)
Transition Care Management Follow-up Telephone Call Date of discharge and from where: 09/28/2023 [facility] How have you been since you were released from the hospital? Patient stated she was prescribed Pepcid for her rash which did not decrease the itch. Any questions or concerns? No  Items Reviewed: Did the pt receive and understand the discharge instructions provided? Yes  Medications obtained and verified? Yes  Other? No  Any new allergies since your discharge? No  Dietary orders reviewed? Yes Do you have support at home? Yes   Follow up appointments reviewed:  PCP Hospital f/u appt confirmed? No  Scheduled to see  on  @ . Specialist Hospital f/u appt confirmed? No  Scheduled to see  on  @ . Are transportation arrangements needed? No  If their condition worsens, is the pt aware to call PCP or go to the Emergency Dept.? Yes Was the patient provided with contact information for the PCP's office or ED? Yes Was to pt encouraged to call back with questions or concerns? Yes   Dawnn Nam [facility]  [facility], [facility] Guide Direct Dial: [phone]  Website: [email or web address]

## 2024-03-07 ENCOUNTER — Telehealth: Payer: Self-pay | Admitting: Family Medicine

## 2024-03-07 NOTE — Telephone Encounter (Signed)
Pt reschedule appointment due to will be out of town

## 2024-03-08 ENCOUNTER — Ambulatory Visit: Payer: BC Managed Care – PPO | Admitting: Family Medicine

## 2024-06-12 DIAGNOSIS — G932 Benign intracranial hypertension: Secondary | ICD-10-CM | POA: Diagnosis not present

## 2024-07-28 ENCOUNTER — Other Ambulatory Visit: Payer: Self-pay | Admitting: Family Medicine

## 2024-08-27 ENCOUNTER — Ambulatory Visit: Payer: Self-pay

## 2024-08-27 ENCOUNTER — Emergency Department (HOSPITAL_BASED_OUTPATIENT_CLINIC_OR_DEPARTMENT_OTHER): Admitting: Radiology

## 2024-08-27 ENCOUNTER — Emergency Department (HOSPITAL_BASED_OUTPATIENT_CLINIC_OR_DEPARTMENT_OTHER)
Admission: EM | Admit: 2024-08-27 | Discharge: 2024-08-27 | Disposition: A | Discharge status: Home / Self Care | Source: Ambulatory Visit | Attending: Emergency Medicine | Admitting: Emergency Medicine

## 2024-08-27 ENCOUNTER — Encounter (HOSPITAL_BASED_OUTPATIENT_CLINIC_OR_DEPARTMENT_OTHER): Payer: Self-pay | Admitting: Emergency Medicine

## 2024-08-27 ENCOUNTER — Other Ambulatory Visit: Payer: Self-pay

## 2024-08-27 DIAGNOSIS — R519 Headache, unspecified: Secondary | ICD-10-CM | POA: Diagnosis not present

## 2024-08-27 DIAGNOSIS — R0789 Other chest pain: Secondary | ICD-10-CM | POA: Diagnosis not present

## 2024-08-27 DIAGNOSIS — R079 Chest pain, unspecified: Secondary | ICD-10-CM

## 2024-08-27 LAB — CBC
HCT: 38.8 % (ref 36.0–46.0)
Hemoglobin: 13.1 g/dL (ref 12.0–15.0)
MCH: 29.8 pg (ref 26.0–34.0)
MCHC: 33.8 g/dL (ref 30.0–36.0)
MCV: 88.2 fL (ref 80.0–100.0)
Platelets: 261 K/uL (ref 150–400)
RBC: 4.4 MIL/uL (ref 3.87–5.11)
RDW: 12.7 % (ref 11.5–15.5)
WBC: 4.5 K/uL (ref 4.0–10.5)
nRBC: 0 % (ref 0.0–0.2)

## 2024-08-27 LAB — BASIC METABOLIC PANEL WITH GFR
Anion gap: 12 (ref 5–15)
BUN: 7 mg/dL (ref 6–20)
CO2: 21 mmol/L — ABNORMAL LOW (ref 22–32)
Calcium: 9.4 mg/dL (ref 8.9–10.3)
Chloride: 102 mmol/L (ref 98–111)
Creatinine, Ser: 0.79 mg/dL (ref 0.44–1.00)
GFR, Estimated: >60 mL/min (ref >60)
Glucose, Bld: 92 mg/dL (ref 70–99)
Potassium: 4 mmol/L (ref 3.5–5.1)
Sodium: 136 mmol/L (ref 135–145)

## 2024-08-27 LAB — TROPONIN T, HIGH SENSITIVITY: Troponin T High Sensitivity: <15 ng/L (ref 0–19)

## 2024-08-27 LAB — D-DIMER, QUANTITATIVE: D-Dimer, Quant: 0.28 ug{FEU}/mL (ref 0.00–0.50)

## 2024-08-27 LAB — PREGNANCY, URINE: Preg Test, Ur: NEGATIVE

## 2024-08-27 MED ORDER — DIPHENHYDRAMINE HCL 50 MG/ML IJ SOLN
25.0000 mg | Freq: Once | INTRAMUSCULAR | Status: AC
  Administered 2024-08-27: 25 mg via INTRAVENOUS
  Filled 2024-08-27: qty 1

## 2024-08-27 MED ORDER — PROCHLORPERAZINE EDISYLATE 10 MG/2ML IJ SOLN
10.0000 mg | Freq: Once | INTRAMUSCULAR | Status: AC
  Administered 2024-08-27: 10 mg via INTRAVENOUS
  Filled 2024-08-27: qty 2

## 2024-08-27 NOTE — Discharge Instructions (Addendum)
 Your symptoms today are likely due to your recent viral illness, please continue OTC cough remedies as needed if your symptoms persist. Follow-up with your PCP if your symptoms persist, return to the emergency department if your symptoms worsen.

## 2024-08-27 NOTE — Telephone Encounter (Signed)
 FYI Pt seen at the ED on 08/27/24 for this problem.

## 2024-08-27 NOTE — Telephone Encounter (Signed)
 FYI Only or Action Required?: FYI only for provider.  Patient was last seen in primary care on 09/21/2023 by Johnny Garnette LABOR, MD.  Called Nurse Triage reporting Chest Pain.  Symptoms began today.  Interventions attempted: OTC medications: see notes.  Symptoms are: gradually worsening.  Triage Disposition: Go to ED Now (or PCP Triage)  Patient/caregiver understands and will follow disposition?: Yes  Copied from CRM [id number]. Topic: Clinical - Red Word Triage >> Aug 27, 2024 10:56 AM Adelita E wrote: [facility] that prompted transfer to Nurse Triage: Patient has been sick for 6 days, chest heaviness started this morning with increased heart rate. Reason for Disposition  Taking a deep breath makes pain worse  Answer Assessment - Initial Assessment Questions Advised ED. Pt reports going to [facility] ED.  1. LOCATION: Where does it hurt?       Right side 2. RADIATION: Does the pain go anywhere else? (e.g., into neck, jaw, arms, back)     no 3. ONSET: When did the chest pain begin? (Minutes, hours or days)      30 minutes ago 4. PATTERN: Does the pain come and go, or has it been constant since it started?  Does it get worse with exertion?      Comes and goes 5. DURATION: How long does it last (e.g., seconds, minutes, hours)     30 minutes ago 6. SEVERITY: How bad is the pain?  (e.g., Scale 1-10; mild, moderate, or severe)     7/10; cramping and heaviness, sharp. 7. CARDIAC RISK FACTORS: Do you have any history of heart problems or risk factors for heart disease? (e.g., angina, prior heart attack; diabetes, high blood pressure, high cholesterol, smoker, or strong family history of heart disease)     Idopathic intercranial htn 8. PULMONARY RISK FACTORS: Do you have any history of lung disease?  (e.g., blood clots in lung, asthma, emphysema, birth control pills)     no 9. CAUSE: What do you think is causing the chest pain?     Think took too much medications:  Lasix , cumbacol, cumbacol fast relief, 2 dayquil tablets, 6 mag 07 pills; took all medications at 10AM today 10. OTHER SYMPTOMS: Do you have any other symptoms? (e.g., dizziness, nausea, vomiting, sweating, fever, difficulty breathing, cough)       Denies diff breathing, faint, n/v, sweating, fever, chills, blurred vision Reports deep breathing hurts right upper chest, hurts when taking deep breath Coughing clear phelgm,runny nose, headached  Protocols used: Chest Pain-A-AH

## 2024-08-27 NOTE — ED Triage Notes (Signed)
 Reports R sided CP and SHOB x 3 days. Worse when taking deep breath.

## 2024-08-27 NOTE — ED Notes (Signed)
 Reviewed AVS/discharge instruction with patient. Time allotted for and all questions answered. Patient is agreeable for d/c and escorted to ed exit by staff.

## 2024-08-27 NOTE — ED Notes (Signed)
  at bedside

## 2024-08-27 NOTE — ED Provider Notes (Signed)
 [place] EMERGENCY DEPARTMENT AT [facility] Provider Note   CSN: [id number] Arrival date & time: 08/27/24  1145     Patient presents with: Chest Pain   Julie Ward is a 28 y.o. female.   28 year old female presenting with right sided chest pain, headache. Patient reports that she has been having cold symptoms like cough and nasal congestion for several days, she also took a 4+ hour [place]. Yesterday she began to notice R sided chest pain which she initially attributed to breast pain, however she now notes worsening pain with deep breathing. Pain is in her R chest without radiation. Denies shortness of breath, abdominal pain, nausea, vomiting, LE edema. History of IIH, no prior brain surgeries, was released by neurology and told to use Lasix /Tylenol  PRN for HA symptoms. She notes a headache behind my eyes today, no vision changes.    Chest Pain      Prior to Admission medications   Medication Sig Start Date End Date Taking? Authorizing Provider  famotidine  (PEPCID ) 20 MG tablet Take 1 tablet (20 mg total) by mouth 2 (two) times daily. 09/28/23   Jerrol Agent, MD  furosemide  (LASIX ) 20 MG tablet TAKE 1 TABLET BY MOUTH EVERY DAY 07/30/24   Johnny Garnette LABOR, MD    Allergies: Penicillins, Fentanyl , Morphine  and codeine, Oxycontin  [oxycodone  hcl], Prednisone, and Valium  [diazepam ]    Review of Systems  Cardiovascular:  Positive for chest pain.    Updated Vital Signs  Vitals:   08/27/24 1155 08/27/24 1420 08/27/24 1425 08/27/24 1543  BP: (!) 132/90 128/77    Pulse: 83 70 64   Resp: 18 20 11    Temp: 97.8 F (36.6 C)   98.6 F (37 C)  TempSrc:    Oral  SpO2: 100% 100% 96%      Physical Exam Vitals and nursing note reviewed.  HENT:     Head: Normocephalic.  Eyes:     Extraocular Movements: Extraocular movements intact.     Pupils: Pupils are equal, round, and reactive to light.  Cardiovascular:     Rate and  Rhythm: Normal rate and regular rhythm.     Heart sounds: Normal heart sounds.  Pulmonary:     Effort: Pulmonary effort is normal.     Breath sounds: Normal breath sounds.  Abdominal:     Palpations: Abdomen is soft.     Tenderness: There is no abdominal tenderness. There is no guarding.  Musculoskeletal:     Cervical back: Normal range of motion. No rigidity.     Right lower leg: No edema.     Left lower leg: No edema.     Comments: Moves all extremities spontaneously without difficulty  Skin:    General: Skin is warm and dry.  Neurological:     General: No focal deficit present.     Mental Status: She is alert and oriented to person, place, and time.     (all labs ordered are listed, but only abnormal results are displayed) Labs Reviewed  BASIC METABOLIC PANEL WITH GFR - Abnormal; Notable for the following components:      Result Value   CO2 21 (*)    All other components within normal limits  CBC  PREGNANCY, URINE  D-DIMER, QUANTITATIVE  TROPONIN T, HIGH SENSITIVITY    EKG: None  Radiology: DG Chest 2 View Result Date: 08/27/2024 CLINICAL DATA:  Chest pain. EXAM: CHEST - 2 VIEW COMPARISON:  11/14/2021. FINDINGS: Trachea is  midline. Heart size normal. Lungs are clear. No pleural fluid. IMPRESSION: Negative. Electronically Signed   By: Newell Eke M.D.   On: 08/27/2024 12:46     Procedures   Medications Ordered in the ED  prochlorperazine (COMPAZINE) injection 10 mg (10 mg Intravenous Given 08/27/24 1534)  diphenhydrAMINE  (BENADRYL ) injection 25 mg (25 mg Intravenous Given 08/27/24 1534)                                    Medical Decision Making This patient presents to the ED for concern of chest pain, cold symptoms, this involves an extensive number of treatment options, and is a complaint that carries with it a high risk of complications and morbidity.  The differential diagnosis includes COVID/Flu/RSV, other viral illness, ACS, PE, costochondritis     Co morbidities that complicate the patient evaluation  IIH   Additional history obtained:  Additional history obtained from CXR External records from outside source obtained and reviewed including Negative   Lab Tests:  I Ordered, and personally interpreted labs.  The pertinent results include: CBC within normal limits, BMP unremarkable.  Initial troponin <15.  Urine hCG negative. D-dimer 0.28.     Imaging Studies ordered:  I ordered imaging studies including CXR  I independently visualized and interpreted imaging which showed Negative I agree with the radiologist interpretation   Cardiac Monitoring: / EKG:  The patient was maintained on a cardiac monitor.  I personally viewed and interpreted the cardiac monitored which showed an underlying rhythm of: NSR    Problem List / ED Course / Critical interventions / Medication management  I ordered medication including compazine and benadryl   for headache  Reevaluation of the patient after these medicines showed that the patient improved I have reviewed the patients home medicines and have made adjustments as needed   Social Determinants of Health:  Depression    Test / Admission - Considered:  Physical exam is reassuring, lungs are clear to auscultation bilaterally without adventitious sounds. Initial labs are reassuring as above, low suspicion for ACS and will discontinue second troponin. PERC negative however patient did recently have a 4+ hour [place], with this and pleuritic nature of chest pain it does raise suspicion for a PE as contributing to her symptoms today. Will obtain d-dimer. Patient endorses a HA that is typical for her, history of IIH, no focal neurologic deficits on exam. Symptoms resolved following administration of migraine cocktail.  D-dimer negative, low suspicion for PE based on these reassuring findings. I discussed these findings in depth with the patient, I suspect that her  symptoms today are likely secondary to sequela of her recent viral illness, CXR is reassuring and without consolidation, low suspicion for pneumonia and I do not feel that course of antibiotics is warranted at this time, as her symptoms are likely viral in nature. I recommend that she continue OTC cough agents, and that she schedule follow-up with her PCP later in the week if her symptoms persist.  She voiced understanding and is in agreement this plan.  Return precaution discussed.  She is appropriate for discharge at this time.   Amount and/or Complexity of Data Reviewed Labs: ordered. Radiology: ordered.  Risk Prescription drug management.        Final diagnoses:  Chest pain, unspecified type  Nonintractable headache, unspecified chronicity pattern, unspecified headache type    ED Discharge Orders  None          Glendia Rocky SAILOR, [place] 08/27/24 1604    Doretha Folks, MD 08/27/24 2329

## 2024-09-18 ENCOUNTER — Encounter: Payer: Self-pay | Admitting: Family Medicine

## 2024-09-18 ENCOUNTER — Ambulatory Visit: Admitting: Family Medicine

## 2024-09-18 ENCOUNTER — Ambulatory Visit: Payer: Self-pay

## 2024-09-18 VITALS — BP 102/78 | HR 70 | Temp 98.0°F | Wt 233.0 lb

## 2024-09-18 DIAGNOSIS — K5010 Crohn's disease of large intestine without complications: Secondary | ICD-10-CM | POA: Diagnosis not present

## 2024-09-18 MED ORDER — DIPHENOXYLATE-ATROPINE 2.5-0.025 MG PO TABS: 2.0000 | ORAL_TABLET | Freq: Four times a day (QID) | ORAL | 0 refills | Status: DC | PRN

## 2024-09-18 NOTE — Telephone Encounter (Signed)
 FYI Only or Action Required?: FYI only for provider: appointment scheduled on 09/18/2024.  Patient was last seen in primary care on 09/21/2023 by Johnny Garnette LABOR, MD.  Called Nurse Triage reporting Diarrhea.  Symptoms began several days ago.  Interventions attempted: Nothing.  Symptoms are: gradually worsening.  Triage Disposition: See Physician Within 4 Hours (or PCP Triage) (overriding See Physician Within 24 Hours)  Patient/caregiver understands and will follow disposition?: Yes      Copied from CRM #[id number]. Topic: Clinical - Red Word Triage >> Sep 18, 2024  2:17 PM Macario HERO wrote: Red Word that prompted transfer to Nurse Triage: Patient stated she has not been able to hold down any food since last Thursday, extremely weak, throwing up, shakes, diarrhea. Reason for Disposition  [1] SEVERE diarrhea (e.g., 7 or more times / day more than normal) AND [2] present > 24 hours (1 day)  Answer Assessment - Initial Assessment Questions 1. DIARRHEA SEVERITY: How bad is the diarrhea? How many more stools have you had in the past 24 hours than normal?      Consistently   2. ONSET: When did the diarrhea begin?      Last Thursday  3. STOOL DESCRIPTION:  How loose or watery is the diarrhea? What is the stool color? Is there any blood or mucous in the stool?     Watery and loose  4. VOMITING: Are you also vomiting? If Yes, ask: How many times in the past 24 hours?      2-3 times  5. ABDOMEN PAIN: Are you having any abdomen pain? If Yes, ask: What does it feel like? (e.g., crampy, dull, intermittent, constant)      Yes when she tries to eat  6. ABDOMEN PAIN SEVERITY: If present, ask: How bad is the pain?  (e.g., Scale 1-10; mild, moderate, or severe)     None currently  7. ORAL INTAKE: If vomiting, Have you been able to drink liquids? How much liquids have you had in the past 24 hours?     States she is unable to drink much due to pain with drinking  8. HYDRATION:  Any signs of dehydration? (e.g., dry mouth [not just dry lips], too weak to stand, dizziness, new weight loss) When did you last urinate?     Feels dizzy and states walking is difficult 9. ANTIBIOTIC USE: Are you taking antibiotics now or have you taken antibiotics in the past 2 months?       Denies  10. OTHER SYMPTOMS: Do you have any other symptoms? (e.g., fever, blood in stool)       Denies  11. PREGNANCY: Is there any chance you are pregnant? When was your last menstrual period?       Denies, LMP Oct 20-Oct 24  Protocols used: [facility]

## 2024-09-18 NOTE — Progress Notes (Signed)
   Subjective:    Patient ID: Julie Ward, female    DOB: May 31, 1996, 28 y.o.   MRN: [id number]  HPI Here for what she thinks is a flare of her Crohn's disease. For the past 4 weeks she has had intermittent loose stools and abdominal pains. Some nausea but no vomiting. No fever. She has not seen any blood in the stools. Her Crohn's was diagnosed by colonoscopy in 2014. She saw [facility] for several years, but she has not had any trouble with it since 2016 and she has not seen anyone for it since then. No recent antibiotic use. She is drinking fluids but not eating much.    Review of Systems  Constitutional: Negative.   Respiratory: Negative.    Cardiovascular: Negative.   Gastrointestinal:  Positive for abdominal pain, diarrhea and nausea. Negative for abdominal distention, blood in stool, constipation and vomiting.  Genitourinary: Negative.        Objective:   Physical Exam Constitutional:      Appearance: She is ill-appearing.  Cardiovascular:     Rate and Rhythm: Normal rate and regular rhythm.     Pulses: Normal pulses.     Heart sounds: Normal heart sounds.  Pulmonary:     Effort: Pulmonary effort is normal.     Breath sounds: Normal breath sounds.  Abdominal:     General: Abdomen is flat. Bowel sounds are normal. There is no distension.     Palpations: There is no mass.     Tenderness: There is no guarding or rebound.     Hernia: No hernia is present.     Comments: Moderately tender diffusely   Neurological:     Mental Status: She is alert.           Assessment & Plan:  Crohn's exacerbation. She can take Lomotil QID as needed. We will refer her to a GI in [place].  Garnette Olmsted, MD

## 2024-09-19 NOTE — Telephone Encounter (Signed)
 Pt was seen by Dr Johnny in the office on 09/18/24 for this problem

## 2024-09-20 ENCOUNTER — Emergency Department (HOSPITAL_BASED_OUTPATIENT_CLINIC_OR_DEPARTMENT_OTHER)
Admission: EM | Admit: 2024-09-20 | Discharge: 2024-09-21 | Disposition: A | Discharge status: Home / Self Care | Attending: Emergency Medicine | Admitting: Emergency Medicine

## 2024-09-20 ENCOUNTER — Emergency Department (HOSPITAL_BASED_OUTPATIENT_CLINIC_OR_DEPARTMENT_OTHER)

## 2024-09-20 ENCOUNTER — Encounter (HOSPITAL_BASED_OUTPATIENT_CLINIC_OR_DEPARTMENT_OTHER): Payer: Self-pay | Admitting: Emergency Medicine

## 2024-09-20 ENCOUNTER — Other Ambulatory Visit: Payer: Self-pay

## 2024-09-20 DIAGNOSIS — K5792 Diverticulitis of intestine, part unspecified, without perforation or abscess without bleeding: Secondary | ICD-10-CM | POA: Diagnosis not present

## 2024-09-20 DIAGNOSIS — Z9049 Acquired absence of other specified parts of digestive tract: Secondary | ICD-10-CM | POA: Diagnosis not present

## 2024-09-20 DIAGNOSIS — R1084 Generalized abdominal pain: Secondary | ICD-10-CM | POA: Insufficient documentation

## 2024-09-20 DIAGNOSIS — R109 Unspecified abdominal pain: Secondary | ICD-10-CM | POA: Diagnosis not present

## 2024-09-20 LAB — URINALYSIS, ROUTINE W REFLEX MICROSCOPIC
Bilirubin Urine: NEGATIVE
Glucose, UA: NEGATIVE mg/dL
Hgb urine dipstick: NEGATIVE
Ketones, ur: >80 mg/dL — AB
Leukocytes,Ua: NEGATIVE
Nitrite: NEGATIVE
Specific Gravity, Urine: >1.046 — ABNORMAL HIGH (ref 1.005–1.030)
pH: 6 (ref 5.0–8.0)

## 2024-09-20 LAB — CBC
HCT: 45.4 % (ref 36.0–46.0)
Hemoglobin: 15.7 g/dL — ABNORMAL HIGH (ref 12.0–15.0)
MCH: 29.6 pg (ref 26.0–34.0)
MCHC: 34.6 g/dL (ref 30.0–36.0)
MCV: 85.5 fL (ref 80.0–100.0)
Platelets: 332 K/uL (ref 150–400)
RBC: 5.31 MIL/uL — ABNORMAL HIGH (ref 3.87–5.11)
RDW: 12.8 % (ref 11.5–15.5)
WBC: 5.1 K/uL (ref 4.0–10.5)
nRBC: 0 % (ref 0.0–0.2)

## 2024-09-20 LAB — COMPREHENSIVE METABOLIC PANEL WITH GFR
ALT: 7 U/L (ref 0–44)
AST: 18 U/L (ref 15–41)
Albumin: 4.7 g/dL (ref 3.5–5.0)
Alkaline Phosphatase: 82 U/L (ref 38–126)
Anion gap: 21 — ABNORMAL HIGH (ref 5–15)
BUN: 10 mg/dL (ref 6–20)
CO2: 12 mmol/L — ABNORMAL LOW (ref 22–32)
Calcium: 10.2 mg/dL (ref 8.9–10.3)
Chloride: 99 mmol/L (ref 98–111)
Creatinine, Ser: 0.8 mg/dL (ref 0.44–1.00)
GFR, Estimated: >60 mL/min (ref >60)
Glucose, Bld: 87 mg/dL (ref 70–99)
Potassium: 4.2 mmol/L (ref 3.5–5.1)
Sodium: 132 mmol/L — ABNORMAL LOW (ref 135–145)
Total Bilirubin: 0.6 mg/dL (ref 0.0–1.2)
Total Protein: 9.6 g/dL — ABNORMAL HIGH (ref 6.5–8.1)

## 2024-09-20 LAB — LIPASE, BLOOD: Lipase: 58 U/L — ABNORMAL HIGH (ref 11–51)

## 2024-09-20 LAB — PREGNANCY, URINE: Preg Test, Ur: NEGATIVE

## 2024-09-20 MED ORDER — ONDANSETRON 4 MG PO TBDP: 4.0000 mg | ORAL_TABLET | Freq: Three times a day (TID) | ORAL | 0 refills | Status: DC | PRN

## 2024-09-20 MED ORDER — IOHEXOL 300 MG/ML  SOLN
100.0000 mL | Freq: Once | INTRAMUSCULAR | Status: AC | PRN
  Administered 2024-09-20: 100 mL via INTRAVENOUS

## 2024-09-20 MED ORDER — DICYCLOMINE HCL 20 MG PO TABS: 20.0000 mg | ORAL_TABLET | Freq: Two times a day (BID) | ORAL | 0 refills | Status: DC | PRN

## 2024-09-20 MED ORDER — SODIUM CHLORIDE 0.9 % IV BOLUS
1000.0000 mL | Freq: Once | INTRAVENOUS | Status: AC
  Administered 2024-09-20: 1000 mL via INTRAVENOUS

## 2024-09-20 NOTE — ED Triage Notes (Signed)
 C/o lower abd pain and n/v/d x 10 days. Denies fevers. Hx of Chron's.

## 2024-09-20 NOTE — ED Provider Notes (Signed)
 [place] EMERGENCY DEPARTMENT AT [facility] Provider Note   CSN: [id number] Arrival date & time: 09/20/24  1736     Patient presents with: Abdominal Pain   Julie Ward is a 28 y.o. female with reported history of Crohn's disease presenting to ED with complaint of abdominal pain.  Patient reports that she is no longer follows GI as she split from her prior GI doctor at [facility] who is managing her Crohn's.  She says normally the Crohn's flareups and abdominal pain will last just a few days and can manage at home.  However her current pain is lasted nearly 2 weeks.  She said she is not able to eat or drink because of the pain.  She is having loose, discolored bowel movements.   She does report that she has severe allergies to prednisone and steroids.  She reports a history of a cholecystectomy and appendectomy.  {Add pertinent medical, surgical, social history, OB history to HPI:32947} HPI     Prior to Admission medications   Medication Sig Start Date End Date Taking? Authorizing Provider  diphenoxylate-atropine (LOMOTIL) 2.5-0.025 MG tablet Take 2 tablets by mouth 4 (four) times daily as needed for diarrhea or loose stools. 09/18/24   Johnny Garnette LABOR, MD  furosemide  (LASIX ) 20 MG tablet TAKE 1 TABLET BY MOUTH EVERY DAY 07/30/24   Johnny Garnette LABOR, MD    Allergies: Penicillins, Fentanyl , Morphine  and codeine, Oxycontin  [oxycodone  hcl], Prednisone, and Valium  [diazepam ]    Review of Systems  Updated Vital Signs BP 127/82   Pulse 82   Temp (!) 97.5 F (36.4 C)   Resp 18   SpO2 100%   Physical Exam Constitutional:      General: She is not in acute distress. HENT:     Head: Normocephalic and atraumatic.  Eyes:     Conjunctiva/sclera: Conjunctivae normal.     Pupils: Pupils are equal, round, and reactive to light.  Cardiovascular:     Rate and Rhythm: Normal rate and regular rhythm.  Pulmonary:     Effort: Pulmonary effort is normal. No respiratory  distress.  Abdominal:     General: There is no distension.     Tenderness: There is generalized abdominal tenderness.  Skin:    General: Skin is warm and dry.  Neurological:     General: No focal deficit present.     Mental Status: She is alert. Mental status is at baseline.  Psychiatric:        Mood and Affect: Mood normal.        Behavior: Behavior normal.     (all labs ordered are listed, but only abnormal results are displayed) Labs Reviewed  LIPASE, BLOOD - Abnormal; Notable for the following components:      Result Value   Lipase 58 (*)    All other components within normal limits  COMPREHENSIVE METABOLIC PANEL WITH GFR - Abnormal; Notable for the following components:   Sodium 132 (*)    CO2 12 (*)    Total Protein 9.6 (*)    Anion gap 21 (*)    All other components within normal limits  CBC - Abnormal; Notable for the following components:   RBC 5.31 (*)    Hemoglobin 15.7 (*)    All other components within normal limits  URINALYSIS, ROUTINE W REFLEX MICROSCOPIC  PREGNANCY, URINE    EKG: None  Radiology: CT ABDOMEN PELVIS W CONTRAST Result Date: 09/20/2024 CLINICAL DATA:  Diverticulitis, abdominal pain, diarrhea EXAM:  CT ABDOMEN AND PELVIS WITH CONTRAST TECHNIQUE: Multidetector CT imaging of the abdomen and pelvis was performed using the standard protocol following bolus administration of intravenous contrast. RADIATION DOSE REDUCTION: This exam was performed according to the departmental dose-optimization program which includes automated exposure control, adjustment of the mA and/or kV according to patient size and/or use of iterative reconstruction technique. CONTRAST:  OMNIPAQUE  IOHEXOL  300 MG/ML  SOLN COMPARISON:  04/17/2019 FINDINGS: Lower chest: No acute pleural or parenchymal lung disease. Hepatobiliary: No focal liver abnormality is seen. Status post cholecystectomy. No biliary dilatation. Pancreas: Unremarkable. No pancreatic ductal dilatation or  surrounding inflammatory changes. Spleen: Normal in size without focal abnormality. Adrenals/Urinary Tract: Adrenal glands are unremarkable. Kidneys are normal, without renal calculi, focal lesion, or hydronephrosis. Bladder is unremarkable. Stomach/Bowel: No bowel obstruction or ileus. Prior appendectomy. No bowel wall thickening or inflammatory change. Vascular/Lymphatic: No significant vascular findings are present. No enlarged abdominal or pelvic lymph nodes. Reproductive: Uterus and bilateral adnexa are unremarkable. Other: No free fluid or free intraperitoneal gas. No abdominal wall hernia. Musculoskeletal: No acute or destructive bony abnormalities. Reconstructed images demonstrate no additional findings. IMPRESSION: 1. No acute intra-abdominal or intrapelvic process. Electronically Signed   By: Ozell Daring M.D.   On: 09/20/2024 22:39    {Document cardiac monitor, telemetry assessment procedure when appropriate:32947} Procedures   Medications Ordered in the ED  sodium chloride  0.9 % bolus 1,000 mL (1,000 mLs Intravenous New Bag/Given 09/20/24 2122)  iohexol  (OMNIPAQUE ) 300 MG/ML solution 100 mL (100 mLs Intravenous Contrast Given 09/20/24 2234)    Clinical Course as of 09/20/24 2320  Thu Sep 20, 2024  2319 Paged [facility] through office [MT]    Clinical Course User Index [MT] Cottie Donnice PARAS, MD   {Click here for ABCD2, HEART and other calculators REFRESH Note before signing:1}                              Medical Decision Making Amount and/or Complexity of Data Reviewed Labs: ordered. Radiology: ordered.  Risk Prescription drug management.   This patient presents to the ED with concern for generalized abdominal pain. This involves an extensive number of treatment options, and is a complaint that carries with it a high risk of complications and morbidity.  The differential diagnosis includes colitis versus Crohn's flareup versus pancreatitis versus other   External records  from outside source obtained and reviewed including [facility] GI evaluation from 2015 noting the patient's history of ileocolonic Crohn's disease diagnosed at age 71, with terminal ileitis in the past, intense abdominal pains, started on Entocort and Bentyl .  I ordered and personally interpreted labs.  The pertinent results include: No leukocytosis.  LFTs and lipase unremarkable.  Elevated anion gap likely related to dehydration.  I ordered imaging studies including CT abdomen pelvis with contrast I independently visualized and interpreted imaging which showed no emergent findings I agree with the radiologist interpretation   I ordered medication including IV fluids for hydration.  Patient was offered but refused antiemetics or pain medicine  I have reviewed the patients home medicines and have made adjustments as needed  Test Considered: doubt pelvic pathology as a cause of these symptoms  I spoke to the GI doctor by phone, please see ED course  After the interventions noted above, I reevaluated the patient and found that they have: improved   Disposition:  After consideration of the diagnostic results and the patients response to treatment,  I feel that the patent would benefit from outpatient follow-up.   {Document critical care time when appropriate  Document review of labs and clinical decision tools ie CHADS2VASC2, etc  Document your independent review of radiology images and any outside records  Document your discussion with family members, caretakers and with consultants  Document social determinants of health affecting pt's care  Document your decision making why or why not admission, treatments were needed:32947:::1}   Final diagnoses:  None    ED Discharge Orders     None

## 2024-09-20 NOTE — ED Notes (Signed)
 CT aware pt will sign a waiver for CT, states she is not preg

## 2024-09-20 NOTE — ED Notes (Signed)
 Patient transported to CT

## 2024-09-20 NOTE — ED Notes (Signed)
 PO Challenge conducted; patient tolerated well; denies nausea

## 2024-09-25 ENCOUNTER — Telehealth: Payer: Self-pay | Admitting: Family Medicine

## 2024-09-25 NOTE — Telephone Encounter (Signed)
 Copied from CRM [id number]. Topic: Referral - Status >> Sep 25, 2024  8:14 AM Frederich PARAS wrote: Reason for CRM: lisa from [facility] called in regards to a refferral  to make not that dr hung can not see the pt, her callback # is [phone]

## 2024-09-26 NOTE — Telephone Encounter (Signed)
 Attempted to call [facility] but office closed will try again in the morning

## 2024-10-01 ENCOUNTER — Telehealth: Payer: Self-pay | Admitting: *Deleted

## 2024-10-01 NOTE — Telephone Encounter (Signed)
 Copied from CRM #[id number]. Topic: Referral - Status >> Oct 01, 2024  9:27 AM Gustabo D wrote: Pt is wanting a update about the GI referral - she says she reached out today and they told her she can't be seen there. Pt would like to know why and why she wasn't called and informed about this.

## 2024-10-01 NOTE — Telephone Encounter (Signed)
 Attempted to call, office is closed will attempt again later.

## 2024-10-02 ENCOUNTER — Ambulatory Visit: Admitting: Family Medicine

## 2024-10-02 ENCOUNTER — Encounter: Payer: Self-pay | Admitting: Family Medicine

## 2024-10-02 VITALS — BP 102/60 | HR 90 | Temp 98.4°F | Wt 222.0 lb

## 2024-10-02 DIAGNOSIS — F4322 Adjustment disorder with anxiety: Secondary | ICD-10-CM | POA: Diagnosis not present

## 2024-10-02 DIAGNOSIS — K5010 Crohn's disease of large intestine without complications: Secondary | ICD-10-CM

## 2024-10-02 NOTE — Progress Notes (Signed)
    Subjective:    Patient ID: Julie Ward, female    DOB: [birth year], 28 y.o.   MRN: [id number]  HPI Here with her mother to discuss her Crohn's symptoms. She has been suffering from diffuse abdominal pains and poor appetite for several months. She occasionally has nausea but does not vomit. No fever. She had been seeing a GI at [facility], but she stopped seeing them last year. She was at the ED on 09-20-57. 25 for these symptoms, and a CT of her abdomen was unremarkable. Labs were remarkable for WBC 5.1, Hgb 15.7, and lipase 58. She was told to follow up with GI. I had placed a referral to [facility] on 09-18-24, and Kimbery just today received a notice that she has an appt to see Camie Furbish PA tomorrow morning. I asked Celia if she ever tried to sip a nutrition shake like Ensure, but she says she is afraid to take in anything dairy because it could make her pain worse.    Review of Systems  Constitutional: Negative.   Respiratory: Negative.    Cardiovascular: Negative.   Gastrointestinal:  Positive for abdominal pain and nausea. Negative for abdominal distention, blood in stool, constipation, diarrhea, rectal pain and vomiting.       Objective:   Physical Exam Constitutional:      Appearance: Normal appearance.  Cardiovascular:     Rate and Rhythm: Normal rate and regular rhythm.     Pulses: Normal pulses.     Heart sounds: Normal heart sounds.  Pulmonary:     Effort: Pulmonary effort is normal.     Breath sounds: Normal breath sounds.  Abdominal:     General: Abdomen is flat.     Palpations: There is no mass.     Tenderness: There is abdominal tenderness. There is guarding. There is no rebound.     Hernia: No hernia is present.     Comments: She is diffusely tender   Neurological:     Mental Status: She is alert.           Assessment & Plan:  Crohn's disease. She will see GI tomorrow morning. In the meantime I suggtesed she try sipping Ensure Plant Based nutriition  shakes. I personally spent a total of 33 minutes in the care of the patient today including getting/reviewing separately obtained history, performing a medically appropriate exam/evaluation, independently interpreting results, and communicating results.  Garnette Olmsted, MD

## 2024-10-02 NOTE — Telephone Encounter (Signed)
 Pt referral has been scheduled for 10/03/24, pt is aware

## 2024-10-03 ENCOUNTER — Other Ambulatory Visit (INDEPENDENT_AMBULATORY_CARE_PROVIDER_SITE_OTHER)

## 2024-10-03 ENCOUNTER — Ambulatory Visit: Admitting: Gastroenterology

## 2024-10-03 ENCOUNTER — Encounter: Payer: Self-pay | Admitting: Gastroenterology

## 2024-10-03 ENCOUNTER — Telehealth: Payer: Self-pay | Admitting: Gastroenterology

## 2024-10-03 ENCOUNTER — Other Ambulatory Visit

## 2024-10-03 VITALS — BP 112/72 | HR 89 | Ht 67.0 in | Wt 222.0 lb

## 2024-10-03 DIAGNOSIS — R197 Diarrhea, unspecified: Secondary | ICD-10-CM

## 2024-10-03 DIAGNOSIS — R432 Parageusia: Secondary | ICD-10-CM

## 2024-10-03 DIAGNOSIS — R630 Anorexia: Secondary | ICD-10-CM | POA: Diagnosis not present

## 2024-10-03 DIAGNOSIS — R5383 Other fatigue: Secondary | ICD-10-CM

## 2024-10-03 DIAGNOSIS — R1030 Lower abdominal pain, unspecified: Secondary | ICD-10-CM | POA: Diagnosis not present

## 2024-10-03 DIAGNOSIS — R634 Abnormal weight loss: Secondary | ICD-10-CM | POA: Diagnosis not present

## 2024-10-03 DIAGNOSIS — R112 Nausea with vomiting, unspecified: Secondary | ICD-10-CM

## 2024-10-03 DIAGNOSIS — R1031 Right lower quadrant pain: Secondary | ICD-10-CM | POA: Diagnosis not present

## 2024-10-03 DIAGNOSIS — R195 Other fecal abnormalities: Secondary | ICD-10-CM | POA: Diagnosis not present

## 2024-10-03 LAB — CBC WITH DIFFERENTIAL/PLATELET
Basophils Absolute: 0 K/uL (ref 0.0–0.1)
Basophils Relative: 0.9 % (ref 0.0–3.0)
Eosinophils Absolute: 0.1 K/uL (ref 0.0–0.7)
Eosinophils Relative: 1.6 % (ref 0.0–5.0)
HCT: 38.2 % (ref 36.0–46.0)
Hemoglobin: 13.2 g/dL (ref 12.0–15.0)
Lymphocytes Relative: 32.8 % (ref 12.0–46.0)
Lymphs Abs: 1.1 K/uL (ref 0.7–4.0)
MCHC: 34.7 g/dL (ref 30.0–36.0)
MCV: 86.4 fl (ref 78.0–100.0)
Monocytes Absolute: 0.5 K/uL (ref 0.1–1.0)
Monocytes Relative: 14.7 % — ABNORMAL HIGH (ref 3.0–12.0)
Neutro Abs: 1.7 K/uL (ref 1.4–7.7)
Neutrophils Relative %: 50 % (ref 43.0–77.0)
Platelets: 281 K/uL (ref 150.0–400.0)
RBC: 4.42 Mil/uL (ref 3.87–5.11)
RDW: 13 % (ref 11.5–15.5)
WBC: 3.5 K/uL — ABNORMAL LOW (ref 4.0–10.5)

## 2024-10-03 LAB — COMPREHENSIVE METABOLIC PANEL WITH GFR
ALT: 7 U/L (ref 0–35)
AST: 13 U/L (ref 0–37)
Albumin: 4.1 g/dL (ref 3.5–5.2)
Alkaline Phosphatase: 56 U/L (ref 39–117)
BUN: 4 mg/dL — ABNORMAL LOW (ref 6–23)
CO2: 25 meq/L (ref 19–32)
Calcium: 9.1 mg/dL (ref 8.4–10.5)
Chloride: 99 meq/L (ref 96–112)
Creatinine, Ser: 0.67 mg/dL (ref 0.40–1.20)
GFR: 117.41 mL/min (ref >60.00)
Glucose, Bld: 98 mg/dL (ref 70–99)
Potassium: 3.2 meq/L — ABNORMAL LOW (ref 3.5–5.1)
Sodium: 136 meq/L (ref 135–145)
Total Bilirubin: 0.6 mg/dL (ref 0.2–1.2)
Total Protein: 8.3 g/dL (ref 6.0–8.3)

## 2024-10-03 LAB — SEDIMENTATION RATE: Sed Rate: 45 mm/h — ABNORMAL HIGH (ref 0–20)

## 2024-10-03 LAB — C-REACTIVE PROTEIN: CRP: 1.3 mg/dL (ref 0.5–20.0)

## 2024-10-03 MED ORDER — NA SULFATE-K SULFATE-MG SULF 17.5-3.13-1.6 GM/177ML PO SOLN: 1.0000 | Freq: Once | ORAL | 0 refills | Status: AC

## 2024-10-03 NOTE — Telephone Encounter (Signed)
 Patient called very concerned about her lab work results. Requesting a call to discuss as soon as possible. Please advise, thank you

## 2024-10-03 NOTE — Patient Instructions (Addendum)
 Your provider has requested that you go to the basement level for lab work before leaving today. Press B on the elevator. The lab is located at the first door on the left as you exit the elevator.  You have been scheduled for an endoscopy and colonoscopy. Please follow the written instructions given to you at your visit today.  If you use inhalers (even only as needed), please bring them with you on the day of your procedure.  DO NOT TAKE 7 DAYS PRIOR TO TEST- Trulicity (dulaglutide) Ozempic, Wegovy (semaglutide) Mounjaro, Zepbound (tirzepatide) Bydureon Bcise (exanatide extended release)  DO NOT TAKE 1 DAY PRIOR TO YOUR TEST Rybelsus (semaglutide) Adlyxin (lixisenatide) Victoza (liraglutide) Byetta (exanatide) ___________________________________________________________________________

## 2024-10-03 NOTE — Telephone Encounter (Signed)
 Julie Ward pt is calling with concerns of lab results.  She was seen today

## 2024-10-03 NOTE — Progress Notes (Signed)
 Julie Ward [id number] [birth year]   Chief Complaint: Decreased appetite, fatigue, abdominal pain, diarrhea  Referring Provider: Johnny Garnette LABOR, MD Primary GI MD: Dr. Avram  HPI: Julie Ward is a 28 y.o. female with past medical history of Crohn's disease, GERD, dysmenorrhea, anxiety, appendectomy, cholecystectomy who presents today for a complaint of decreased appetite, fatigue, abdominal pain, diarrhea .    Seen by PCP 09/18/2024 for Crohn's flare.  Endorsed intermittent loose stools and abdominal pain over the previous 4 weeks, no blood in the stool.  Crohn's was diagnosed by colonoscopy in 2014, followed with [facility] for several years but had not had any trouble since 2016 and had not been following with any GI practice. She was advised to take Lomotil 4 times daily as needed and was referred to GI for further evaluation.  Seen in the ED 09/20/2024 for abdominal pain ongoing for 2 weeks and limiting ability to eat and drink due to pain.  Having loose, discolored bowel movements.  Had generalized abdominal tenderness on exam. Noted to have history of ileocolonic Crohn's disease diagnosed at age 59, with terminal ileitis in the past, intense abdominal pain, started on Entocort and Bentyl . Labs with no leukocytosis.  LFTs and lipase unremarkable.  Elevated anion gap likely related to dehydration. No inflammatory findings on CT.  Dr. Saintclair with [facility] was consulted over phone and advised patient to follow-up in the office, no indication for prednisone without evidence of inflammation on CT and without office evaluation.  Patient was comfortable with Bentyl  and Zofran .  Had follow-up with PCP 10/02/2024.  Continued to have diffuse abdominal pain and poor appetite for several months, with nausea but no vomiting.  No fever.  Patient was advised to try sipping Ensure plant-based nutrition shakes that she had endorsed sensitivity to dairy.  Advised to keep appointment with GI following  day.  Patient has actually been seen here in the past. Remotely known to Dr. Jakie.  Seen in 2018 by Greig Corti, PA-C following ER visit for abdominal pain. She has history of Crohn's ileocolitis diagnosed on colonoscopy October 2014 per Dr. Jakie. She was noted to have cecal and terminal ileal inflammatory changes and biopsies were consistent with chronic active ileitis and chronic active ileocolitis.  Does not appear that she followed up with our office after her diagnosis was made.  She had EGD done at [facility] in 2015 normal and gastric emptying scan in 2015 abnormal at 2 hours with 82% retention and normal at 4 hours.   Plan at last visit was to check labs and stool studies, get a CT of the abdomen and pelvis, start Levsin , and consider repeat colonoscopy if CT nondiagnostic, to be established with Dr. Avram.  She was advised to have office follow up but did not return.   Discussed the use of AI scribe software for clinical note transcription with the patient, who gave verbal consent to proceed.  History of Present Illness Julie Ward is a 28 year old female with Crohn's disease who presents with persistent gastrointestinal symptoms and weight loss.  Here today with her mother.  Gastrointestinal symptoms - Diagnosed with Crohn's disease in 2014 following colonoscopy - No gastroenterology follow-up since 2018 - Persistent right lower quadrant abdominal discomfort, constant in nature - Decreased appetite and significant weight loss of approximately eleven pounds over the last few weeks - Bowel movements inconsistent, with recent episodes of loose, mucousy diarrhea (three times between yesterday and today) - No hematochezia  or melena - No stool tests for infection performed - Nausea and vomiting occurred at symptom onset, with last vomiting episode in the first week of November; no current nausea - No current use of Nexium  for acid reflux, though previously used  at Crohn's diagnosis  Hydration and nutritional status - Severe dehydration with persistent dry mouth and bad taste - Weakness, low energy, and spending most of the time in bed - Poor oral intake; not eating well - No use of protein shakes due to dairy sensitivity and uncertainty about dietary tolerances - Drinks multiple bottles of water daily, but continues to experience dry mouth - Dilutes Gatorade and Pedialyte with water to manage hydration  Constitutional symptoms - No fever - Chills present, attributed to anxiety  Ophthalmologic and neurologic symptoms - History of idiopathic intracranial hypertension (IIH) - Experiences eye floaters, associated with stress - No new rashes, eye pain, or redness - Scheduled neurology follow-up next week  Cardiopulmonary symptoms - Has been having some retrosternal chest pain after eating and drinking, denies painful swallowing, was evaluated in ED for chest pain recently with unremarkable workup - Chest pain is more discomfort than pain, unable to characterize other than it is something noticeable, does not occur frequently and is very brief when it occurs, does not radiate - Thinks there may be some association with anxiety - Occurred this morning after she woke up and was drinking some water - No shortness of breath with exertion, just general fatigue - No history of heart or lung disease   Previous GI Procedures/Imaging   Colonoscopy 08/31/2013 Probable localized Crohn's disease of the distal terminal ileum and ileocecal valve, rule out infiltrative process.  Other considerations would be unusual inflammation with some atypical infiltrative process. Path: 1. Surgical [P], terminal ileum, biopsy - CHRONIC ACTIVE ILEITIS, SEE COMMENT. - NEGATIVE FOR DYSPLASIA. 2. Surgical [P], ileocecal valve, biopsy - CHRONIC ACTIVE ILEOCOLITIS, SEE COMMENT. - NEGATIVE FOR DYSPLASIA.  CT A/P 09/20/2024 IMPRESSION: 1. No acute intra-abdominal or  intrapelvic process.  Past Medical History:  Diagnosis Date   Abdominal pain, recurrent    Allergy    Anxiety    Appendicitis    Crohn's disease of colon (HCC) 10/14   sees Dr. Vina Rattler at [facility]    Dysmenorrhea    GERD (gastroesophageal reflux disease)    not taking any medication   Shortness of breath dyspnea    ith thyroid  problem    Past Surgical History:  Procedure Laterality Date   APPENDECTOMY  07-14-11   laparoscopic    CHOLECYSTECTOMY  02/11/2012   Procedure: LAPAROSCOPIC CHOLECYSTECTOMY WITH INTRAOPERATIVE CHOLANGIOGRAM;  Surgeon: Lynwood MALVA Pina, MD;  Location: [facility] OR;  Service: General;  Laterality: N/A;   ESOPHAGOGASTRODUODENOSCOPY  01/28/2012   Procedure: ESOPHAGOGASTRODUODENOSCOPY (EGD);  Surgeon: Fairy VEAR Gaskins, MD;  Location: [facility] OR;  Service: Gastroenterology;  Laterality: N/A;   THYROID  LOBECTOMY Left 04/29/2015   Procedure: LEFT TOTAL THYROID  LOBECTOMY;  Surgeon: Lynwood Pina, MD;  Location: [facility] OR;  Service: General;  Laterality: Left;   WISDOM TOOTH EXTRACTION      Current Outpatient Medications  Medication Sig Dispense Refill   furosemide  (LASIX ) 20 MG tablet TAKE 1 TABLET BY MOUTH EVERY DAY 90 tablet 0   dicyclomine  (BENTYL ) 20 MG tablet Take 1 tablet (20 mg total) by mouth 2 (two) times daily as needed for up to 20 days for spasms. (Patient not taking: Reported on 10/03/2024) 20 tablet 0   diphenoxylate-atropine (LOMOTIL) 2.5-0.025 MG tablet Take  2 tablets by mouth 4 (four) times daily as needed for diarrhea or loose stools. (Patient not taking: Reported on 10/03/2024) 100 tablet 0   ondansetron  (ZOFRAN -ODT) 4 MG disintegrating tablet Take 1 tablet (4 mg total) by mouth every 8 (eight) hours as needed for up to 12 doses. (Patient not taking: Reported on 10/03/2024) 12 tablet 0   No current facility-administered medications for this visit.    Allergies as of 10/03/2024 - Review Complete 10/03/2024  Allergen Reaction Noted   Penicillins Shortness Of  Breath, Nausea Only, and Other (See Comments) 01/12/2012   Fentanyl  Other (See Comments) 01/19/2015   Morphine  and codeine  12/04/2014   Oxycontin  [oxycodone  hcl] Nausea And Vomiting 04/17/2012   Prednisone Hives 03/08/2014   Valium  [diazepam ]  05/02/2015    Family History  Problem Relation Age of Onset   Cancer Paternal Aunt        ovarian, kidney   Cancer Paternal Uncle        colon   Colon cancer Paternal Uncle    Cancer Maternal Grandmother        breast    Hypertension Maternal Grandmother    Irritable bowel syndrome Maternal Grandmother    Heart disease Maternal Grandmother    Hypertension Paternal Grandmother    Hyperlipidemia Paternal Grandmother     Social History   Tobacco Use   Smoking status: Never   Smokeless tobacco: Never  Vaping Use   Vaping status: Never Used  Substance Use Topics   Alcohol use: Yes    Comment: weekends   Drug use: No     Review of Systems:    Constitutional: Positive for unintentional weight loss and fatigue.  Occasional chills but no fever Cardiovascular: Occasional mild retrosternal chest discomfort occurring after eating and drinking (see HPI for more detail) Respiratory: No SOB  Gastrointestinal: See HPI and otherwise negative   Physical Exam:  Vital signs: BP 112/72   Pulse 89   Ht 5' 7 (1.702 m)   Wt 222 lb (100.7 kg)   LMP 09/30/2024   SpO2 98%   BMI 34.77 kg/m   Wt Readings from Last 3 Encounters:  10/03/24 222 lb (100.7 kg)  10/02/24 222 lb (100.7 kg)  09/18/24 233 lb (105.7 kg)    Constitutional: Pleasant female in NAD, appears tired, alert and cooperative Head:  Normocephalic and atraumatic.  Eyes: No scleral icterus. Respiratory: Respirations even and unlabored. Lungs clear to auscultation bilaterally.  No wheezes, crackles, or rhonchi.  Cardiovascular:  Regular rate and rhythm. No murmurs. No peripheral edema.  No chest wall tenderness. Gastrointestinal:  Soft, nondistended, generalized tenderness to  palpation worse in lower abdomen. No rebound or guarding. Normal bowel sounds. No appreciable masses or hepatomegaly. Rectal:  Not performed.  Neurologic:  Alert and oriented x4;  grossly normal neurologically.  Skin:   Dry and intact without significant lesions or rashes. Psychiatric: Oriented to person, place and time. Demonstrates good judgement and reason without abnormal affect or behaviors.   RELEVANT LABS AND IMAGING: CBC    Component Value Date/Time   WBC 5.1 09/20/2024 1743   RBC 5.31 (H) 09/20/2024 1743   HGB 15.7 (H) 09/20/2024 1743   HCT 45.4 09/20/2024 1743   PLT 332 09/20/2024 1743   MCV 85.5 09/20/2024 1743   MCH 29.6 09/20/2024 1743   MCHC 34.6 09/20/2024 1743   RDW 12.8 09/20/2024 1743   LYMPHSABS 1.7 11/14/2021 2256   MONOABS 0.8 11/14/2021 2256   EOSABS 0.1 11/14/2021 2256  BASOSABS 0.0 11/14/2021 2256    CMP     Component Value Date/Time   NA 132 (L) 09/20/2024 1743   NA 138 05/11/2022 1030   K 4.2 09/20/2024 1743   CL 99 09/20/2024 1743   CO2 12 (L) 09/20/2024 1743   GLUCOSE 87 09/20/2024 1743   BUN 10 09/20/2024 1743   BUN 8 05/11/2022 1030   CREATININE 0.80 09/20/2024 1743   CREATININE 0.68 07/06/2013 1600   CALCIUM 10.2 09/20/2024 1743   PROT 9.6 (H) 09/20/2024 1743   PROT 7.6 05/11/2022 1030   ALBUMIN 4.7 09/20/2024 1743   ALBUMIN 4.2 05/11/2022 1030   AST 18 09/20/2024 1743   ALT 7 09/20/2024 1743   ALKPHOS 82 09/20/2024 1743   BILITOT 0.6 09/20/2024 1743   BILITOT 0.3 05/11/2022 1030   GFRNONAA >60 09/20/2024 1743   GFRAA >60 04/17/2019 0446     Assessment/Plan:   Assessment & Plan History of Crohn's disease Abdominal pain Unintentional weight loss Decreased appetite Fatigue Diarrhea Mucus in stool Nausea and vomiting (resolved) Patient with history of Crohn's disease diagnosed by colonoscopy in 2014 (ileitis and ileocolitis on biopsies).  Has previously been on Entocort, not currently on any medications for management of  Crohn's.  Last time she had GI follow-up was 2018. Has recently been feeling unwell, seen by PCP 09/18/2024 for suspected Crohn's flare.  Seen in the ED 09/20/2024 for 2 weeks of abdominal pain and decreased oral intake with loose bowel movements. Today states she continues to have these symptoms.  She has lost 11 pounds in the last 2 weeks.  Eating very little, though she is trying to stay hydrated.  Denies fever but has intermittent chills.  Fatigue. Generalized abdominal pain to palpation, though states her pain tends to be worse in RLQ and that this is constant.  Has not noticed any signs of GI bleeding.  Initially was having some nausea and vomiting but this has since resolved.  Denies acid reflux or heartburn.  - Schedule EGD and colonoscopy. I thoroughly discussed the procedure with the patient to include nature of the procedure, alternatives, benefits, and risks (including but not limited to bleeding, infection, perforation, anesthesia/cardiac/pulmonary complications). Patient verbalized understanding and gave verbal consent to proceed with procedure.  - Labs today: CBC, CMP, ESR, CRP - Stool studies: C. difficile PCR, stool culture - Will set reminder to follow-up on stool studies to make sure that they are negative well in advance of upcoming procedures. - Encouraged intake of plant-based protein shakes, broths, and electrolyte drinks to prevent dehydration and electrolyte imbalance.  Chest pain Has been having some intermittent retrosternal chest pain after eating and drinking, denies painful swallowing, was evaluated in ED for chest pain recently with unremarkable workup.  States it feels more like discomfort than pain.  Occurs infrequently, briefly, and does not radiate.  Does not have any associated shortness of breath.  Not experiencing any shortness of breath with exertion, just having general fatigue. Heart sounds are normal on exam today with regular rate and rhythm. She does not have  any chest wall tenderness. Denies any history of heart or lung disease. Discussed further with Dr. Avram. Do not think she needs cardiac evaluation at this time but did advise patient to let us  know if her symptoms worsen.    Camie Furbish, PA-C [facility] 10/03/2024, 8:57 AM  Patient Care Team: Johnny Garnette LABOR, MD as PCP - General

## 2024-10-04 ENCOUNTER — Telehealth: Payer: Self-pay

## 2024-10-04 ENCOUNTER — Encounter: Payer: Self-pay | Admitting: Internal Medicine

## 2024-10-04 ENCOUNTER — Ambulatory Visit: Payer: Self-pay | Admitting: Gastroenterology

## 2024-10-04 DIAGNOSIS — R634 Abnormal weight loss: Secondary | ICD-10-CM

## 2024-10-04 DIAGNOSIS — R630 Anorexia: Secondary | ICD-10-CM

## 2024-10-04 DIAGNOSIS — R112 Nausea with vomiting, unspecified: Secondary | ICD-10-CM

## 2024-10-04 DIAGNOSIS — R1030 Lower abdominal pain, unspecified: Secondary | ICD-10-CM

## 2024-10-04 MED ORDER — POTASSIUM CHLORIDE ER 20 MEQ PO TBCR: 20.0000 meq | EXTENDED_RELEASE_TABLET | Freq: Every day | ORAL | 0 refills | Status: DC

## 2024-10-04 NOTE — Telephone Encounter (Signed)
 Urgent ambulatory referral for EGD placed and message sent to referral coordinators.

## 2024-10-04 NOTE — Telephone Encounter (Signed)
 Schedule the EGD and we will see what that shows us  and I suggest moving the colonoscopy to December 4 where there is a 3 PM opening

## 2024-10-04 NOTE — Telephone Encounter (Signed)
 I could do an EGD on her tomorrow (1230 arrival spot is open) I am willing to overbook with a colonoscopy also but do not know if she could take the prep based upon what she is describing.   Julie Ward - feel her out about this and see what is possible and we will check with [facility] and see if I can do double procedure which would be best to get both done - explain that it is possible not guaranteed to do both

## 2024-10-04 NOTE — Telephone Encounter (Signed)
 I called and spoke with the pt. She is willing to take the EGD spot. She is unsure if she could stomach the prep She reports that even drinking Pedialyte or eating ice causes her significant pain. She states she is willing to try the prep, but she is concerned because doesn't know if she will be able to complete it.   Could we book her for the EGD spot tomorrow and leave her on for the colonoscopy on 12/8?

## 2024-10-04 NOTE — Telephone Encounter (Signed)
 See results note for further communication.

## 2024-10-04 NOTE — Telephone Encounter (Signed)
 Spoke with pt. EGD rescheduled for tomorrow 11/21 at 12:30. Discussed instructions including clear liquids after midnight and NPO after 0930. Pt will need a driver. Pt verbalized understanding of all instructions.    Offered to move colonoscopy up to 10/18/24. Pt reports that she is closing on her house that day and will not be able to move her procedure. No other earlier appts available. Pt will keep 12/8 appt.

## 2024-10-04 NOTE — Telephone Encounter (Signed)
 Pt was scheduled for appointment with the GI for 10/03/24

## 2024-10-04 NOTE — Telephone Encounter (Signed)
 While speaking with pt to relay lab results, she reported that her symptoms are getting worse. I can't eat or drink anything without having severe cramping and stabbing pains in my stomach. She reports that it occurs no matter what she is eating or drinking. Denies N/V. Reports diarrhea. I ate one small piece of chicken yesterday, and I had 3 episodes of diarrhea. Pt also reports a 3 pound weight loss in 1 day. Pt is concerned about waiting until December to have her EGD/Colon.

## 2024-10-05 ENCOUNTER — Ambulatory Visit (AMBULATORY_SURGERY_CENTER): Admitting: Internal Medicine

## 2024-10-05 ENCOUNTER — Encounter: Payer: Self-pay | Admitting: *Deleted

## 2024-10-05 ENCOUNTER — Encounter: Payer: Self-pay | Admitting: Internal Medicine

## 2024-10-05 VITALS — BP 133/70 | HR 65 | Temp 98.1°F | Resp 10 | Ht 67.0 in | Wt 222.0 lb

## 2024-10-05 DIAGNOSIS — R112 Nausea with vomiting, unspecified: Secondary | ICD-10-CM

## 2024-10-05 DIAGNOSIS — R1030 Lower abdominal pain, unspecified: Secondary | ICD-10-CM

## 2024-10-05 DIAGNOSIS — R634 Abnormal weight loss: Secondary | ICD-10-CM | POA: Diagnosis not present

## 2024-10-05 LAB — CLOSTRIDIUM DIFFICILE BY PCR: Toxigenic C. Difficile by PCR: NEGATIVE

## 2024-10-05 LAB — SPECIMEN STATUS REPORT

## 2024-10-05 MED ORDER — HYOSCYAMINE SULFATE 0.125 MG SL SUBL: 0.1250 mg | SUBLINGUAL_TABLET | Freq: Three times a day (TID) | SUBLINGUAL | 0 refills | Status: DC

## 2024-10-05 MED ORDER — SODIUM CHLORIDE 0.9 % IV SOLN: 500.0000 mL | Freq: Once | INTRAVENOUS | Status: DC

## 2024-10-05 NOTE — Progress Notes (Signed)
 History and Physical Interval Note:  10/05/2024 1:24 PM  Julie Ward  has presented today for endoscopic procedure(s), with the diagnosis of  Encounter Diagnoses  Name Primary?   Unintentional weight loss Yes   Nausea and vomiting, unspecified vomiting type    Lower abdominal pain   .  The various methods of evaluation and treatment have been discussed with the patient and/or family. After consideration of risks, benefits and other options for treatment, the patient has consented to  the endoscopic procedure(s).   The patient's history has been reviewed, patient examined, no change in status, stable for endoscopic procedure(s).  I have reviewed the patient's chart and labs.  Questions were answered to the patient's satisfaction.    She is aware and accepts risks of possible dislodging of piercings that she says cannot be removed.  [name], MD, NOLIA

## 2024-10-05 NOTE — Op Note (Addendum)
 [facility] Patient Name: Julie Ward Procedure Date: 10/05/2024 1:24 PM MRN: [id number] Endoscopist: Lupita FORBES Commander , MD, [id number] Age: 28 Referring MD:  Date of Birth: Apr 26, 1996 Gender: Female Account #: [id number] Procedure:                Upper GI endoscopy Indications:              Lower abdominal pain, Nausea with vomiting, Weight                            loss Medicines:                Monitored Anesthesia Care Procedure:                Pre-Anesthesia Assessment:                           - Prior to the procedure, a History and Physical                            was performed, and patient medications and                            allergies were reviewed. The patient's tolerance of                            previous anesthesia was also reviewed. The risks                            and benefits of the procedure and the sedation                            options and risks were discussed with the patient.                            All questions were answered, and informed consent                            was obtained. Prior Anticoagulants: The patient has                            taken no anticoagulant or antiplatelet agents. ASA                            Grade Assessment: II - A patient with mild systemic                            disease. After reviewing the risks and benefits,                            the patient was deemed in satisfactory condition to                            undergo the procedure.  After obtaining informed consent, the endoscope was                            passed under direct vision. Throughout the                            procedure, the patient's blood pressure, pulse, and                            oxygen saturations were monitored continuously. The                            GIF [id number] #[id number] was introduced through the                            mouth, and advanced to the second part of  duodenum.                            The upper GI endoscopy was accomplished without                            difficulty. The patient tolerated the procedure                            well. Scope In: Scope Out: Findings:                 The esophagus was normal.                           The stomach was normal.                           The examined duodenum was normal.                           The cardia and gastric fundus were normal on                            retroflexion. Complications:            No immediate complications. Estimated Blood Loss:     Estimated blood loss: none. Impression:               - Normal esophagus.                           - Normal stomach.                           - Normal examined duodenum.                           - No specimens collected. Recommendation:           - Patient has a contact number available for  emergencies. The signs and symptoms of potential                            delayed complications were discussed with the                            patient. Return to normal activities tomorrow.                            Written discharge instructions were provided to the                            patient.                           - Low fiber diet.                           - She has colonoscopy scheduled                           - trial of hyoscyamine  0.125 mg tid ac Lupita FORBES Commander, MD 10/05/2024 1:56:21 PM This report has been signed electronically.

## 2024-10-05 NOTE — Progress Notes (Signed)
 Pt's states no medical or surgical changes since previsit or office visit.

## 2024-10-05 NOTE — Patient Instructions (Addendum)
 Esophagus stomach and upper intestine all look normal.  It is hard to prescribe treatment because of intolerances that you have at this time.  It is possible that stress is contributing to some of this but we need to perform other tests to get a better idea.  Your colonoscopy is next, as you know.  Keep trying to stay hydrated by drinking liquids, small amounts if required throughout the day, try eating frequent small meals low in fiber to see if you can tolerate that.  General Guidelines: Choose foods that are easy to digest and low in fiber. Avoid foods with seeds, skins, peels, and whole grains. Drink plenty of fluids unless told otherwise by your doctor. Foods to Avoid: Whole grain breads, cereals, brown rice, and pasta Raw fruits and vegetables, especially those with skins, seeds, or peels (like apples, berries, oranges, figs, coconut, brussels sprouts, celery, green beans, potato peels)[2] Legumes (beans, lentils, peas) Nuts and seeds Dried fruits High-fiber snack bars Foods You Can Eat: White bread, plain bagels, white rice, regular pasta Cooked or canned fruits and vegetables without skins or seeds (like applesauce, canned peaches, carrots, potatoes without skin) Tender, well-cooked meats, poultry, fish, eggs Dairy products (milk, cheese, yogurt) Plain crackers, pancakes, waffles made with white flour Tips: Peel and cook vegetables before eating. Avoid fried or spicy foods if they upset your stomach. Read food labels to check fiber content.   I appreciate the opportunity to care for you. [name], MD, FACG  YOU HAD AN ENDOSCOPIC PROCEDURE TODAY AT THE [facility]:   Refer to the procedure report that was given to you for any specific questions about what was found during the examination.  If the procedure report does not answer your questions, please call your gastroenterologist to clarify.  If you requested that your care partner not be given the details  of your procedure findings, then the procedure report has been included in a sealed envelope for you to review at your convenience later.  YOU SHOULD EXPECT: Some feelings of bloating in the abdomen. Passage of more gas than usual.  Walking can help get rid of the air that was put into your GI tract during the procedure and reduce the bloating. If you had a lower endoscopy (such as a colonoscopy or flexible sigmoidoscopy) you may notice spotting of blood in your stool or on the toilet paper. If you underwent a bowel prep for your procedure, you may not have a normal bowel movement for a few days.  Please Note:  You might notice some irritation and congestion in your nose or some drainage.  This is from the oxygen used during your procedure.  There is no need for concern and it should clear up in a day or so.  SYMPTOMS TO REPORT IMMEDIATELY:  Following upper endoscopy (EGD)  Vomiting of blood or coffee ground material  New chest pain or pain under the shoulder blades  Painful or persistently difficult swallowing  New shortness of breath  Fever of 100F or higher  Black, tarry-looking stools  For urgent or emergent issues, a gastroenterologist can be reached at any hour by calling [phone]. Do not use MyChart messaging for urgent concerns.    DIET:  Follow a Low Fiber Diet. We do recommend a small meal at first, but then you may proceed to your regular diet.  Drink plenty of fluids but you should avoid alcoholic beverages for 24 hours.  MEDICATIONS: Continue present medications.  FOLLOW UP: Follow  up with scheduled colonoscopy. Colonoscopy rescheduled to Dec 4th, 2025, arrive at 3:00 pm.  Thank you for allowing us  to provide for your healthcare needs today.  ACTIVITY:  You should plan to take it easy for the rest of today and you should NOT DRIVE or use heavy machinery until tomorrow (because of the sedation medicines used during the test).    FOLLOW UP: Our staff will call the  number listed on your records the next business day following your procedure.  We will call around 7:15- 8:00 am to check on you and address any questions or concerns that you may have regarding the information given to you following your procedure. If we do not reach you, we will leave a message.     If any biopsies were taken you will be contacted by phone or by letter within the next 1-3 weeks.  Please call us  at [phone] if you have not heard about the biopsies in 3 weeks.    SIGNATURES/CONFIDENTIALITY: You and/or your care partner have signed paperwork which will be entered into your electronic medical record.  These signatures attest to the fact that that the information above on your After Visit Summary has been reviewed and is understood.  Full responsibility of the confidentiality of this discharge information lies with you and/or your care-partner.

## 2024-10-05 NOTE — Progress Notes (Signed)
 Sedate, gd SR, tolerated procedure well, VSS, report to RN

## 2024-10-07 LAB — STOOL CULTURE: E coli, Shiga toxin Assay: NEGATIVE

## 2024-10-08 ENCOUNTER — Telehealth: Payer: Self-pay

## 2024-10-08 ENCOUNTER — Telehealth: Payer: Self-pay | Admitting: Internal Medicine

## 2024-10-08 NOTE — Telephone Encounter (Signed)
 I spoke with the pt. Relayed the providers recommendations including going to the ER for further evaluation of symptoms. Pt states she is not going to the ER, as she doesn't feel like she will receive any other assistance there. Advised pt that if she is not able to eat and drink, she can become dehydrated quickly and it would be best for her to be evaluated. Pt verbalized understanding.

## 2024-10-08 NOTE — Telephone Encounter (Signed)
  Follow up Call-     10/05/2024   12:44 PM  Call back number  Post procedure Call Back phone  # [phone]  Permission to leave phone message Yes     Patient questions:  Do you have a fever, pain , or abdominal swelling? No. Pain Score  0 *  Have you tolerated food without any problems? No  Have you been able to return to your normal activities? Yes.    Do you have any questions about your discharge instructions: Diet   No. Medications  No. Follow up visit  No.  Do you have questions or concerns about your Care? No.  Actions: * If pain score is 4 or above: No action needed, pain <4.  Patient with c/o not being able to eat and drink. States it makes her feel sick whenever she tries to eat something. She stated she called the emergency number provided on discharge paperwork, explained situation and currently waiting on provider on call to call her back. Advised to reach out if she has any other symptoms or concerns.

## 2024-10-08 NOTE — Patient Instructions (Incomplete)

## 2024-10-08 NOTE — Progress Notes (Deleted)
 No chief complaint on file.    HISTORY OF PRESENT ILLNESS:  10/08/24 ALL:  Julie Ward returns for follow up for IIH. She continues furosemide  20mg  daily.  03/03/2023 ALL:  Julie Ward returns for follow up for IIH. She continues furosemide  20mg  daily. She reports headaches have been well managed. She has had a few milder headaches over the past few weeks. She has taken Tylenol  once for abortive therapy. No vision loss. She did have an episode where she felt something was flying around her face but nothing was there. She is having to wear glasses more. She is planning to start IVF treatments soon. Eye exam scheduled tomorrow.   05/11/2022 ALL: Julie Ward returns for follow up for IIH. She continues furosemide  and tolerating well. She reports headaches are rare. She has blurred vision at times but denies loss of vision. She was seen by ophthalmology last month and reports exam was stable. No significant improvement but no worsening. She is working on weight loss efforts. She is down 7 pounds since 12/2021. She had trouble tolerating 64 ounces of water and usually drinks about 24 ounces daily,   12/22/2021 ALL: Julie Ward is a 28 y.o. female here today for follow up for IIH. Opening pressure 32. Bilateral papilledema with ophthalmology exam. She was previously started on Diamox  250 mg, she has been reporting feeling sick on it and is unable to tolerate it. Dr. Vear advised trying furosemide  20 mg. At this time she denies having any headaches and only having occasional pressure behind the eyes in the mornings. No vision changes or vision loss. She is working on weight loss at this time and is interested in seeing health weight and wellness. She sees [facility] for her eye exams and will continue close follow up with them.   HISTORY (copied from Dr Duncan previous note)  I had the pleasure of seeing your patient, Julie Ward, at [facility] for neurologic consultation  regarding her optic disc edema and concern about idiopathic intracranial hypertension.   She is a 28 year old woman who was found on routine eye exam to have mild optic nerve edema bilaterally.     She saw Dr. Joli for a routine eye exam (telemedicine with a technician live with the patient) who noted optic disc edema on routine eye exam.  OCT RNFL confirmed optic disc edema and no evidence of drusen.  She saw her ophthalmology 08/21/2021.  She was referred to Dr. Corbin who confirmed the bilateral optic nerve edema, grade 1.   Visual acuity was 20/20.  Intraocular pressures were normal (16 OD and OS).  She denies any visual field issues.   She denies eye pain or frequent headache.  She denies pulsatile tinnitus.   Rarely, she feels lightheaded if she stands up fast.   No diplopia.      She is referred for further evaluation and treatment.   She used to weight 277 and no weighs 255.       She gets occasional mild headaches that resolve on their own.  She presented to the [facility] emergency room 05/01/2020 due to a severe headache lasting 4 days.  She had a CT scan at [facility].  05/01/2020.  It was read as showing no acute intracranial abnormality.  The pituitary is not mentioned on the report.     REVIEW OF SYSTEMS: Out of a complete 14 system review of symptoms, the patient complains only of the following symptoms, blurred vision and all other reviewed  systems are negative.   ALLERGIES: Allergies  Allergen Reactions   Penicillins Shortness Of Breath, Nausea Only and Other (See Comments)    Made her sick/Childhood Allergy Has patient had a PCN reaction causing immediate rash, facial/tongue/throat swelling, SOB or lightheadedness with hypotension:yes Has patient had a PCN reaction causing severe rash involving mucus membranes or skin necrosis: No Has patient had a PCN reaction that required hospitalization: No Has patient had a PCN reaction occurring within the last 10 years:No .   Prednisone  Hives   Fentanyl  Other (See Comments)    anxiety   Hydrocodone  Nausea And Vomiting   Morphine  And Codeine Nausea And Vomiting and Other (See Comments)    Caused stomach to burn    Oxycontin  [Oxycodone  Hcl] Nausea And Vomiting    vomiting   Valium  [Diazepam ] Other (See Comments)    Hyperactivity      HOME MEDICATIONS: Outpatient Medications Prior to Visit  Medication Sig Dispense Refill   dicyclomine  (BENTYL ) 20 MG tablet Take 1 tablet (20 mg total) by mouth 2 (two) times daily as needed for up to 20 days for spasms. (Patient not taking: No sig reported) 20 tablet 0   diphenoxylate -atropine  (LOMOTIL ) 2.5-0.025 MG tablet Take 2 tablets by mouth 4 (four) times daily as needed for diarrhea or loose stools. (Patient not taking: No sig reported) 100 tablet 0   furosemide  (LASIX ) 20 MG tablet TAKE 1 TABLET BY MOUTH EVERY DAY (Patient not taking: Reported on 10/05/2024) 90 tablet 0   hyoscyamine  (LEVSIN  SL) 0.125 MG SL tablet Place 1 tablet (0.125 mg total) under the tongue 3 (three) times daily before meals. 90 tablet 0   ondansetron  (ZOFRAN -ODT) 4 MG disintegrating tablet Take 1 tablet (4 mg total) by mouth every 8 (eight) hours as needed for up to 12 doses. (Patient not taking: No sig reported) 12 tablet 0   Potassium Chloride  ER 20 MEQ TBCR Take 1 tablet (20 mEq total) by mouth daily. (Patient not taking: Reported on 10/05/2024) 2 tablet 0   Facility-Administered Medications Prior to Visit  Medication Dose Route Frequency Provider Last Rate Last Admin   0.9 %  sodium chloride  infusion  500 mL Intravenous Once Avram Lupita BRAVO, MD         PAST MEDICAL HISTORY: Past Medical History:  Diagnosis Date   Abdominal pain, recurrent    Allergy    Anxiety    Appendicitis    Crohn's disease of colon (HCC) 08/15/2013   sees Dr. Vina Rattler at [facility]    Dysmenorrhea    GERD (gastroesophageal reflux disease)    not taking any medication   IIH (idiopathic intracranial hypertension)    I  have been cleared of that since July 2025   Shortness of breath dyspnea    ith thyroid  problem     PAST SURGICAL HISTORY: Past Surgical History:  Procedure Laterality Date   APPENDECTOMY  07-14-11   laparoscopic    CHOLECYSTECTOMY  02/11/2012   Procedure: LAPAROSCOPIC CHOLECYSTECTOMY WITH INTRAOPERATIVE CHOLANGIOGRAM;  Surgeon: Lynwood MALVA Pina, MD;  Location: [facility] OR;  Service: General;  Laterality: N/A;   ESOPHAGOGASTRODUODENOSCOPY  01/28/2012   Procedure: ESOPHAGOGASTRODUODENOSCOPY (EGD);  Surgeon: Fairy VEAR Gaskins, MD;  Location: [facility] OR;  Service: Gastroenterology;  Laterality: N/A;   THYROID  LOBECTOMY Left 04/29/2015   Procedure: LEFT TOTAL THYROID  LOBECTOMY;  Surgeon: Lynwood Pina, MD;  Location: [facility] OR;  Service: General;  Laterality: Left;   WISDOM TOOTH EXTRACTION       FAMILY HISTORY:  Family History  Problem Relation Age of Onset   Cancer Paternal Aunt        ovarian, kidney   Cancer Paternal Uncle        colon   Cancer Maternal Grandmother        breast    Hypertension Maternal Grandmother    Irritable bowel syndrome Maternal Grandmother    Heart disease Maternal Grandmother    Hypertension Paternal Grandmother    Hyperlipidemia Paternal Grandmother    Colon cancer Paternal Grandfather    Esophageal cancer Neg Hx    Rectal cancer Neg Hx    Stomach cancer Neg Hx      SOCIAL HISTORY: Social History   Socioeconomic History   Marital status: Single    Spouse name: Not on file   Number of children: 0   Years of education: 14   Highest education level: Not on file  Occupational History   Occupation: [employer]   Occupation: [employer]  Tobacco Use   Smoking status: Never   Smokeless tobacco: Never  Vaping Use   Vaping status: Never Used  Substance and Sexual Activity   Alcohol use: Yes    Comment: weekends   Drug use: No   Sexual activity: Never  Other Topics Concern   Not on file  Social History Narrative   Right handed   Caffeine use: coffee/soda (coffee  2-3 times per week, soda daily)   Social Drivers of Corporate Investment Banker Strain: Not on file  Food Insecurity: Not on file  Transportation Needs: Not on file  Physical Activity: Not on file  Stress: Not on file  Social Connections: Unknown (03/26/2022)   Received from [facility]   Social Network    Social Network: Not on file  Intimate Partner Violence: Unknown (02/16/2022)   Received from [facility]   HITS    Physically Hurt: Not on file    Insult or Talk Down To: Not on file    Threaten Physical Harm: Not on file    Scream or Curse: Not on file     PHYSICAL EXAM  There were no vitals filed for this visit.   There is no height or weight on file to calculate BMI.  Generalized: Well developed, in no acute distress  Cardiology: normal rate and rhythm, no murmur auscultated  Respiratory: clear to auscultation bilaterally    Neurological examination  Mentation: Alert oriented to time, place, history taking. Follows all commands speech and language fluent Cranial nerve II-XII: Pupils were equal round reactive to light. Extraocular movements were full, visual field were full on confrontational test. Facial sensation and strength were normal. Head turning and shoulder shrug  were normal and symmetric. Motor: The motor testing reveals 5 over 5 strength of all 4 extremities. Good symmetric motor tone is noted throughout.  Gait: normal   DIAGNOSTIC DATA (LABS, IMAGING, TESTING) - I reviewed patient records, labs, notes, testing and imaging myself where available.  Lab Results  Component Value Date   WBC 3.5 (L) 10/03/2024   HGB 13.2 10/03/2024   HCT 38.2 10/03/2024   MCV 86.4 10/03/2024   PLT 281.0 10/03/2024      Component Value Date/Time   NA 136 10/03/2024 0952   NA 138 05/11/2022 1030   K 3.2 (L) 10/03/2024 0952   CL 99 10/03/2024 0952   CO2 25 10/03/2024 0952   GLUCOSE 98 10/03/2024 0952   BUN 4 (L) 10/03/2024 0952   BUN 8 05/11/2022 1030  CREATININE 0.67 10/03/2024 0952   CREATININE 0.68 07/06/2013 1600   CALCIUM 9.1 10/03/2024 0952   PROT 8.3 10/03/2024 0952   PROT 7.6 05/11/2022 1030   ALBUMIN 4.1 10/03/2024 0952   ALBUMIN 4.2 05/11/2022 1030   AST 13 10/03/2024 0952   ALT 7 10/03/2024 0952   ALKPHOS 56 10/03/2024 0952   BILITOT 0.6 10/03/2024 0952   BILITOT 0.3 05/11/2022 1030   GFRNONAA >60 09/20/2024 1743   GFRAA >60 04/17/2019 0446   No results found for: CHOL, HDL, LDLCALC, LDLDIRECT, TRIG, CHOLHDL No results found for: YHAJ8R No results found for: VITAMINB12 Lab Results  Component Value Date   TSH 5.027 (H) 11/14/2021        No data to display               No data to display           ASSESSMENT AND PLAN  28 y.o. year old female  has a past medical history of Abdominal pain, recurrent, Allergy, Anxiety, Appendicitis, Crohn's disease of colon (HCC) (08/15/2013), Dysmenorrhea, GERD (gastroesophageal reflux disease), IIH (idiopathic intracranial hypertension), and Shortness of breath dyspnea. here with    No diagnosis found.  Julie Ward is doing well. No significant headaches or vision changes. She wishes to start a family and plans to start IVF soon. We will plan to discontinue furosemide  pending normal ophthalmology exam tomorrow. Labs have been stable. Continue with the weight loss efforts and healthy lifestyle habits. Continue close follow up with opthalmology for the optic nerve edema. Follow up in 1 year.  No orders of the defined types were placed in this encounter.    No orders of the defined types were placed in this encounter.     Greig Forbes, MSN, FNP-C 10/08/2024, 11:35 AM  [facility] [address], [address] [place], [place] [address] [phone]

## 2024-10-08 NOTE — Telephone Encounter (Signed)
 Patient called stating she can not eat or drink anything. She is in severe pain. States she can barely sit up as well. Stated she is going to die if she is not given a prescription for her inflammation. Patient stated she does not understand why she can not be given a prescription for inflammation. States she cannot wait until 12/4. Things have gotten worse since appointment on Friday. Patient is requesting a call back. Please advise, thank you.

## 2024-10-08 NOTE — Telephone Encounter (Signed)
 Called back and spoke with pt. Advised recommendations to go to the ER. Pt asking about inflammation (elevated sed rate.) She wants to know if this could be causing her symptoms, and if so, could a prescription be called in for this.

## 2024-10-08 NOTE — Telephone Encounter (Signed)
 Spoke with pt. She reports that since Friday, she feels like her symptoms have gotten worse. She is not able to eat, reports trying to consume protein shakes, but even this causes abd pain. She reports the pain is in the RLQ, and it feels like a heavy pressure. She has no energy. Weight loss from 221 to 217. Denies N/V. She has tried the Dicyclomine , but does not feel like it has helped. She was given Hyoscyamine  on Friday after EGD, but read that it can increase dry mouth, and she already has a very dry mouth. Pt states, even sucking on ice chips hurts her stomach.

## 2024-10-09 ENCOUNTER — Telehealth: Payer: Self-pay | Admitting: Family Medicine

## 2024-10-09 NOTE — Telephone Encounter (Signed)
 Patient reschedule appointment due to in and out of the hospital and will not be able to make the appointment.  Patient did not wish to elaborate on the reason in and out of the hospital.

## 2024-10-09 NOTE — Telephone Encounter (Signed)
 Per Zelda: patient did not ask for a call back, sent as FYI

## 2024-10-09 NOTE — Telephone Encounter (Signed)
 Same symptoms she had prior o the EGD which was normal and so was CT  She was prescribed hyoscyamine  SL to take before she eats   Did she try that?

## 2024-10-10 ENCOUNTER — Ambulatory Visit: Admitting: Family Medicine

## 2024-10-10 DIAGNOSIS — G932 Benign intracranial hypertension: Secondary | ICD-10-CM

## 2024-10-10 NOTE — Telephone Encounter (Signed)
 The pt has not tried the hyoscyamine  before meals.She will try that and call back with an update next week.

## 2024-10-15 ENCOUNTER — Other Ambulatory Visit: Payer: Self-pay

## 2024-10-15 ENCOUNTER — Emergency Department (HOSPITAL_BASED_OUTPATIENT_CLINIC_OR_DEPARTMENT_OTHER)
Admission: EM | Admit: 2024-10-15 | Discharge: 2024-10-15 | Disposition: A | Discharge status: Home / Self Care | Attending: Emergency Medicine | Admitting: Emergency Medicine

## 2024-10-15 ENCOUNTER — Encounter (HOSPITAL_BASED_OUTPATIENT_CLINIC_OR_DEPARTMENT_OTHER): Payer: Self-pay

## 2024-10-15 ENCOUNTER — Emergency Department (HOSPITAL_BASED_OUTPATIENT_CLINIC_OR_DEPARTMENT_OTHER)

## 2024-10-15 DIAGNOSIS — R1032 Left lower quadrant pain: Secondary | ICD-10-CM | POA: Insufficient documentation

## 2024-10-15 DIAGNOSIS — R1084 Generalized abdominal pain: Secondary | ICD-10-CM | POA: Insufficient documentation

## 2024-10-15 DIAGNOSIS — R112 Nausea with vomiting, unspecified: Secondary | ICD-10-CM | POA: Insufficient documentation

## 2024-10-15 DIAGNOSIS — K5090 Crohn's disease, unspecified, without complications: Secondary | ICD-10-CM | POA: Diagnosis not present

## 2024-10-15 DIAGNOSIS — R1031 Right lower quadrant pain: Secondary | ICD-10-CM | POA: Insufficient documentation

## 2024-10-15 DIAGNOSIS — Z79899 Other long term (current) drug therapy: Secondary | ICD-10-CM | POA: Diagnosis not present

## 2024-10-15 DIAGNOSIS — R1030 Lower abdominal pain, unspecified: Secondary | ICD-10-CM | POA: Diagnosis not present

## 2024-10-15 LAB — URINALYSIS, ROUTINE W REFLEX MICROSCOPIC
Bacteria, UA: NONE SEEN
Bilirubin Urine: NEGATIVE
Glucose, UA: NEGATIVE mg/dL
Hgb urine dipstick: NEGATIVE
Ketones, ur: >80 mg/dL — AB
Leukocytes,Ua: NEGATIVE
Nitrite: NEGATIVE
Protein, ur: 30 mg/dL — AB
Specific Gravity, Urine: >1.046 — ABNORMAL HIGH (ref 1.005–1.030)
pH: 6 (ref 5.0–8.0)

## 2024-10-15 LAB — CBC
HCT: 42.4 % (ref 36.0–46.0)
Hemoglobin: 14.4 g/dL (ref 12.0–15.0)
MCH: 29.2 pg (ref 26.0–34.0)
MCHC: 34 g/dL (ref 30.0–36.0)
MCV: 86 fL (ref 80.0–100.0)
Platelets: 300 K/uL (ref 150–400)
RBC: 4.93 MIL/uL (ref 3.87–5.11)
RDW: 12.9 % (ref 11.5–15.5)
WBC: 4.8 K/uL (ref 4.0–10.5)
nRBC: 0 % (ref 0.0–0.2)

## 2024-10-15 LAB — COMPREHENSIVE METABOLIC PANEL WITH GFR
ALT: 9 U/L (ref 0–44)
AST: 17 U/L (ref 15–41)
Albumin: 4.4 g/dL (ref 3.5–5.0)
Alkaline Phosphatase: 73 U/L (ref 38–126)
Anion gap: 20 — ABNORMAL HIGH (ref 5–15)
BUN: <5 mg/dL — ABNORMAL LOW (ref 6–20)
CO2: 20 mmol/L — ABNORMAL LOW (ref 22–32)
Calcium: 10.2 mg/dL (ref 8.9–10.3)
Chloride: 98 mmol/L (ref 98–111)
Creatinine, Ser: 0.64 mg/dL (ref 0.44–1.00)
GFR, Estimated: >60 mL/min (ref >60)
Glucose, Bld: 109 mg/dL — ABNORMAL HIGH (ref 70–99)
Potassium: 3.5 mmol/L (ref 3.5–5.1)
Sodium: 138 mmol/L (ref 135–145)
Total Bilirubin: 0.6 mg/dL (ref 0.0–1.2)
Total Protein: 9.3 g/dL — ABNORMAL HIGH (ref 6.5–8.1)

## 2024-10-15 LAB — BASIC METABOLIC PANEL WITH GFR
Anion gap: 21 — ABNORMAL HIGH (ref 5–15)
Anion gap: 20 — ABNORMAL HIGH (ref 5–15)
BUN: <5 mg/dL — ABNORMAL LOW (ref 6–20)
BUN: <5 mg/dL — ABNORMAL LOW (ref 6–20)
CO2: 13 mmol/L — ABNORMAL LOW (ref 22–32)
CO2: 17 mmol/L — ABNORMAL LOW (ref 22–32)
Calcium: 8.6 mg/dL — ABNORMAL LOW (ref 8.9–10.3)
Calcium: 9.5 mg/dL (ref 8.9–10.3)
Chloride: 101 mmol/L (ref 98–111)
Chloride: 99 mmol/L (ref 98–111)
Creatinine, Ser: 0.47 mg/dL (ref 0.44–1.00)
Creatinine, Ser: 0.55 mg/dL (ref 0.44–1.00)
GFR, Estimated: >60 mL/min (ref >60)
GFR, Estimated: >60 mL/min (ref >60)
Glucose, Bld: 86 mg/dL (ref 70–99)
Glucose, Bld: 99 mg/dL (ref 70–99)
Potassium: 3.6 mmol/L (ref 3.5–5.1)
Potassium: 3.7 mmol/L (ref 3.5–5.1)
Sodium: 135 mmol/L (ref 135–145)
Sodium: 136 mmol/L (ref 135–145)

## 2024-10-15 LAB — HCG, SERUM, QUALITATIVE: Preg, Serum: NEGATIVE

## 2024-10-15 LAB — LIPASE, BLOOD: Lipase: 33 U/L (ref 11–51)

## 2024-10-15 LAB — LACTIC ACID, PLASMA: Lactic Acid, Venous: 0.8 mmol/L (ref 0.5–1.9)

## 2024-10-15 MED ORDER — DROPERIDOL 2.5 MG/ML IJ SOLN
1.2500 mg | Freq: Once | INTRAMUSCULAR | Status: AC
  Administered 2024-10-15: 1.25 mg via INTRAVENOUS
  Filled 2024-10-15: qty 2

## 2024-10-15 MED ORDER — MORPHINE SULFATE (PF) 4 MG/ML IV SOLN: 4.0000 mg | Freq: Once | INTRAVENOUS | Status: DC

## 2024-10-15 MED ORDER — ONDANSETRON HCL 4 MG/2ML IJ SOLN
4.0000 mg | Freq: Once | INTRAMUSCULAR | Status: AC
  Administered 2024-10-15: 4 mg via INTRAVENOUS
  Filled 2024-10-15: qty 2

## 2024-10-15 MED ORDER — LACTATED RINGERS IV BOLUS
1000.0000 mL | Freq: Once | INTRAVENOUS | Status: AC
  Administered 2024-10-15: 1000 mL via INTRAVENOUS

## 2024-10-15 MED ORDER — IOHEXOL 300 MG/ML  SOLN
100.0000 mL | Freq: Once | INTRAMUSCULAR | Status: AC | PRN
  Administered 2024-10-15: 100 mL via INTRAVENOUS

## 2024-10-15 NOTE — ED Provider Notes (Signed)
 Julie Ward EMERGENCY DEPARTMENT AT [facility] Provider Note   CSN: [id number] Arrival date & time: 10/15/24  9288     History Chief Complaint  Patient presents with   Abdominal Pain    HPI: Julie Ward is a 28 y.o. female with history pertinent for Crohn's disease, prior thyroid  lobectomy, IIH who presents complaining of abdominal pain nausea, vomiting. Patient arrived via POV accompanied by girlfriend, Julie Ward.  History provided by patient.  No interpreter required during this encounter.  Patient reports that she has had abdominal pain for approximately 1 month.  Reports that she has been to the emergency department several times for evaluation, however symptoms have persisted.  Reports that she primarily has right lower quadrant pain, however last night she had bilateral lower quadrant pain that was worse than prior, describes the pain as sharp and crampy.  Also reports that she has had intermittent nausea and vomiting, most recently prior to today was fractionally 2 weeks ago, however nausea/vomiting returned overnight.  Reports that she recently had an EGD 2 weeks ago that was unrevealing, and that she is scheduled for a colonoscopy on 12/4.  Reports that she has been taking Bentyl  for symptoms without significant relief.  Reports that she was also prescribed hyoscyamine , however has not yet taken this as she is hesitant to try new medications, and she googled the side effects which were worrisome to her.  Denies any fevers, chills, diarrhea, hematochezia, melena.  Denies possibility of pregnancy, pelvic pain, vaginal discharge, vaginal bleeding.  Patient's recorded medical, surgical, social, medication list and allergies were reviewed in the Snapshot window as part of the initial history.   Prior to Admission medications   Medication Sig Start Date End Date Taking? Authorizing Provider  dicyclomine  (BENTYL ) 20 MG tablet Take 1 tablet (20 mg total) by mouth 2 (two) times daily  as needed for up to 20 days for spasms. Patient not taking: No sig reported 09/20/24 10/10/24  Cottie Donnice PARAS, MD  diphenoxylate -atropine  (LOMOTIL ) 2.5-0.025 MG tablet Take 2 tablets by mouth 4 (four) times daily as needed for diarrhea or loose stools. Patient not taking: No sig reported 09/18/24   Johnny Garnette LABOR, MD  furosemide  (LASIX ) 20 MG tablet TAKE 1 TABLET BY MOUTH EVERY DAY Patient not taking: Reported on 10/05/2024 07/30/24   Johnny Garnette LABOR, MD  hyoscyamine  (LEVSIN  SL) 0.125 MG SL tablet Place 1 tablet (0.125 mg total) under the tongue 3 (three) times daily before meals. 10/05/24   Avram Lupita BRAVO, MD  ondansetron  (ZOFRAN -ODT) 4 MG disintegrating tablet Take 1 tablet (4 mg total) by mouth every 8 (eight) hours as needed for up to 12 doses. Patient not taking: No sig reported 09/20/24   Cottie Donnice PARAS, MD  Potassium Chloride  ER 20 MEQ TBCR Take 1 tablet (20 mEq total) by mouth daily. Patient not taking: Reported on 10/05/2024 10/04/24   Arletta Camie BRAVO, PA-C     Allergies: Penicillins, Prednisone, Fentanyl , Hydrocodone , Morphine  and codeine, Oxycontin  [oxycodone  hcl], and Valium  [diazepam ]   Review of Systems   ROS as per HPI  Physical Exam Updated Vital Signs BP 125/84 (BP Location: Right Arm)   Pulse 79   Temp 97.9 F (36.6 C) (Oral)   LMP 09/30/2024 (Exact Date)   SpO2 100%  Physical Exam Vitals and nursing note reviewed.  Constitutional:      General: She is not in acute distress.    Appearance: She is well-developed.  HENT:     Head: Normocephalic  and atraumatic.  Eyes:     Conjunctiva/sclera: Conjunctivae normal.  Cardiovascular:     Rate and Rhythm: Normal rate and regular rhythm.     Heart sounds: No murmur heard. Pulmonary:     Effort: Pulmonary effort is normal. No respiratory distress.     Breath sounds: Normal breath sounds.  Abdominal:     Palpations: Abdomen is soft.     Tenderness: There is generalized abdominal tenderness (Mild diffuse abdominal  tenderness, most prominent in the bilateral lower quadrant) and tenderness in the right lower quadrant, suprapubic area and left lower quadrant. There is no right CVA tenderness, left CVA tenderness, guarding or rebound.  Musculoskeletal:        General: No swelling.     Cervical back: Neck supple.  Skin:    General: Skin is warm and dry.     Capillary Refill: Capillary refill takes less than 2 seconds.  Neurological:     Mental Status: She is alert.  Psychiatric:        Mood and Affect: Mood normal.     ED Course/ Medical Decision Making/ A&P    Procedures Procedures   Medications Ordered in ED Medications  lactated ringers  bolus 1,000 mL (has no administration in time range)  morphine  (PF) 4 MG/ML injection 4 mg (has no administration in time range)  ondansetron  (ZOFRAN ) injection 4 mg (has no administration in time range)    Medical Decision Making:   Julie Ward is a 28 y.o. female who presents for abdominal pain, nausea as per above.  Physical exam is pertinent for mild generalized abdominal pain, most prominent in the bilateral lower quadrant.   The differential includes but is not limited to gastritis, gastroenteritis, Crohn's flare, bowel obstruction, stricture, cramping, cystitis, pyelonephritis, nephrolithiasis, pancreatitis, pregnancy.  Independent historian: None  External data reviewed: Notes: Reviewed patient's multiple recent gastroenterology telephone notes  Initial Plan:  Screening labs including CBC and Metabolic panel to evaluate for infectious or metabolic etiology of disease.  Screening lipase for pancreatitis given nausea/vomiting/abdominal pain Screening hCG to evaluate for pregnancy Urinalysis with reflex culture ordered to evaluate for UTI or relevant urologic/nephrologic pathology.  CT abdomen pelvis to evaluate for structural/infectious intra-abdominal pathology.  EKG to evaluate for cardiac pathology Objective evaluation as below reviewed    Labs: Ordered, Independent interpretation, and Details: ***  Radiology: Ordered, Independent interpretation, Details: ***, and All images reviewed independently. ***Agree with radiology report at this time.   CT ABDOMEN PELVIS W CONTRAST Result Date: 09/20/2024 CLINICAL DATA:  Diverticulitis, abdominal pain, diarrhea EXAM: CT ABDOMEN AND PELVIS WITH CONTRAST TECHNIQUE: Multidetector CT imaging of the abdomen and pelvis was performed using the standard protocol following bolus administration of intravenous contrast. RADIATION DOSE REDUCTION: This exam was performed according to the departmental dose-optimization program which includes automated exposure control, adjustment of the mA and/or kV according to patient size and/or use of iterative reconstruction technique. CONTRAST:  OMNIPAQUE  IOHEXOL  300 MG/ML  SOLN COMPARISON:  04/17/2019 FINDINGS: Lower chest: No acute pleural or parenchymal lung disease. Hepatobiliary: No focal liver abnormality is seen. Status post cholecystectomy. No biliary dilatation. Pancreas: Unremarkable. No pancreatic ductal dilatation or surrounding inflammatory changes. Spleen: Normal in size without focal abnormality. Adrenals/Urinary Tract: Adrenal glands are unremarkable. Kidneys are normal, without renal calculi, focal lesion, or hydronephrosis. Bladder is unremarkable. Stomach/Bowel: No bowel obstruction or ileus. Prior appendectomy. No bowel wall thickening or inflammatory change. Vascular/Lymphatic: No significant vascular findings are present. No enlarged abdominal or pelvic lymph nodes.  Reproductive: Uterus and bilateral adnexa are unremarkable. Other: No free fluid or free intraperitoneal gas. No abdominal wall hernia. Musculoskeletal: No acute or destructive bony abnormalities. Reconstructed images demonstrate no additional findings. IMPRESSION: 1. No acute intra-abdominal or intrapelvic process. Electronically Signed   By: Ozell Daring M.D.   On: 09/20/2024 22:39     EKG/Medicine tests: Ordered and Independent interpretation EKG Interpretation:                  Interventions: LR bolus, Zofran , morphine   See the EMR for full details regarding lab and imaging results.  Patient presents to the emergency department for nausea, vomiting, worsening of subacute abdominal pain.  Patient does have mild diffuse abdominal tenderness, particularly in the bilateral lower quadrants.  Given patient with worsening of symptoms, do feel that patient warrants repeat CT of the abdomen and pelvis as well as screening labs, and symptomatic treatment with fluids, antiemetics, pain management.  {LSCOPA:33420}  Discussion of management or test interpretations with external provider(s): ***  Risk Drugs:{LSDRUGS:33399} Treatment: {LSTREATMENT:33409} Surgery:{LSSURGERY:33410} Critical Care: ***  Disposition: {LSDISPO:33388}  MDM generated using voice dictation software and may contain dictation errors.  Please contact me for any clarification or with any questions.  Clinical Impression:  1. Nausea and vomiting, unspecified vomiting type      Data Unavailable   Final Clinical Impression(s) / ED Diagnoses Final diagnoses:  Nausea and vomiting, unspecified vomiting type    Rx / DC Orders ED Discharge Orders     None

## 2024-10-15 NOTE — ED Triage Notes (Signed)
 Reports lower abdominal pain X 1 month, that worsened today. Also Endorses N/V.   Hx Crohns

## 2024-10-15 NOTE — ED Notes (Signed)
 Reviewed AVS/discharge instructions with patient. Time allotted for and all questions answered. Patient is agreeable for d/c and escorted to ED exit by staff.

## 2024-10-15 NOTE — Discharge Instructions (Addendum)
 Julie Ward  Thank you for allowing us  to take care of you today.  You came to the Emergency Department today because you are having nausea, vomiting, abdominal pain.  Here in the emergency department your labs were overall reassuring except for some low bicarb levels and elevated ion gap which is typically a sign of dehydration.  Your CT does not show any signs of complication of your Crohn's, we spoke with the [facility] doctors, and they do recommend that you take your hyoscyamine , as that tends to be a very well-tolerated medication.  Your bicarb and anion gap did worsen despite fluids, and we did recommend further rehydration and repeating this lab, however you feel like your anxiety is unable to tolerate this.  We recommend drinking plenty of fluids, and following up closely with your primary care doctor for reevaluation of this, particularly before you undergo any anesthesia or start your bowel prep for your colonoscopy because this will worsen your dehydration.  To-Do: 1. Please follow-up with your primary doctor within 2 days / as soon as possible.   Please return to the Emergency Department or call 911 if you experience have worsening of your symptoms, or do not get better, chest pain, shortness of breath, severe or significantly worsening pain, high fever, severe confusion, pass out or have any reason to think that you need emergency medical care.   We hope you feel better soon.   Mitzie Later, MD Department of Emergency Medicine [facility]

## 2024-10-17 ENCOUNTER — Telehealth: Payer: Self-pay | Admitting: Internal Medicine

## 2024-10-17 MED ORDER — METOCLOPRAMIDE HCL 10 MG PO TABS: 10.0000 mg | ORAL_TABLET | Freq: Four times a day (QID) | ORAL | 0 refills | Status: DC

## 2024-10-17 NOTE — Telephone Encounter (Signed)
 Returned pts call.  She states that she was seen in the ED on 12/1 for nausea and vomiting.  She was given Zofran  at that time. She states that she has not been able to keep anything down since she was seen and Zofran  is not helpful.  She says she is trying to drink to stay hydrated but everything comes back up.  I offered to let her postpone her procedure but states that she needs to have the colonoscopy done as soon as possible and cannot wait.  Dr. Avram please advise.

## 2024-10-17 NOTE — Telephone Encounter (Signed)
 Inbound call from patient stating that she is schedueld to have a colonoscopy for tomorrow at 3:00 with Dr. Avram. She state she was seen in the ER for nausea and vomiting and is not able to keep anything down. She is concerned she will not be able to complete the prep, and is requesting a call to discuss. Please advise.

## 2024-10-17 NOTE — Telephone Encounter (Signed)
 If she cannot keep anything down she cannot do her colonoscopy.  She has to be able to take the prep, as you know.  I will prescribe Reglan  to start taking.  But if she is unable to tolerate liquids with that she should go to the ER again and I think they should admit her so she can get IV medication and have her colonoscopy completed.  She should go to a hospital ER either [facility] or [facility] and not a freestanding ER like [facility].  Meds ordered this encounter  Medications   metoCLOPramide  (REGLAN ) 10 MG tablet    Sig: Take 1 tablet (10 mg total) by mouth every 6 (six) hours for 2 days.    Dispense:  8 tablet    Refill:  0

## 2024-10-17 NOTE — Telephone Encounter (Signed)
 Phoned pt and reviewed instructions given by Dr. Avram. Pt verbalized understanding.

## 2024-10-18 ENCOUNTER — Encounter: Payer: Self-pay | Admitting: Internal Medicine

## 2024-10-18 ENCOUNTER — Ambulatory Visit: Admitting: Internal Medicine

## 2024-10-18 VITALS — BP 134/87 | HR 65 | Temp 97.3°F | Resp 10 | Ht 67.0 in | Wt 222.0 lb

## 2024-10-18 DIAGNOSIS — K630 Abscess of intestine: Secondary | ICD-10-CM | POA: Diagnosis not present

## 2024-10-18 DIAGNOSIS — R197 Diarrhea, unspecified: Secondary | ICD-10-CM

## 2024-10-18 DIAGNOSIS — K6289 Other specified diseases of anus and rectum: Secondary | ICD-10-CM | POA: Diagnosis not present

## 2024-10-18 DIAGNOSIS — K529 Noninfective gastroenteritis and colitis, unspecified: Secondary | ICD-10-CM | POA: Diagnosis not present

## 2024-10-18 DIAGNOSIS — R1030 Lower abdominal pain, unspecified: Secondary | ICD-10-CM

## 2024-10-18 MED ORDER — SODIUM CHLORIDE 0.9 % IV SOLN: 500.0000 mL | Freq: Once | INTRAVENOUS | Status: DC

## 2024-10-18 NOTE — Progress Notes (Signed)
 Report to PACU, RN, vss, BBS= Clear.

## 2024-10-18 NOTE — Progress Notes (Unsigned)
 [facility] History and Physical   Primary Care Physician:  Johnny Garnette LABOR, MD   Reason for Procedure:    Encounter Diagnoses  Name Primary?   Diarrhea, unspecified type Yes   Lower abdominal pain      Plan:    Colonoscopy   The patient was provided an opportunity to ask questions and all were answered. The patient agreed with the plan.   HPI: Julie Ward is a 28 y.o. female here for evaluation of diarrhea and abdominal pain.  She has had a lot of trouble with nausea and vomiting as well.  She called in yesterday as ondansetron  has been ineffective and I prescribed Reglan  to take to facilitate preparation.  CT abdomen pelvis with contrast yesterday and in November  She has history of Crohn's ileocolitis diagnosed on colonoscopy October 2014 per Dr. Jakie. She was noted to have cecal and terminal ileal inflammatory changes and biopsies were consistent with chronic active ileitis and chronic active ileocolitis.  Gastrointestinal symptoms - Diagnosed with Crohn's disease in 2014 following colonoscopy - No gastroenterology follow-up since 2018 - Persistent right lower quadrant abdominal discomfort, constant in nature - Decreased appetite and significant weight loss of approximately eleven pounds over the last few weeks - Bowel movements inconsistent, with recent episodes of loose, mucousy diarrhea (three times between yesterday and today) - No hematochezia or melena - No stool tests for infection performed - Nausea and vomiting occurred at symptom onset, with last vomiting episode in the first week of November; no current nausea - No current use of Nexium  for acid reflux, though previously used at Crohn's diagnosis Past Medical History:  Diagnosis Date   Abdominal pain, recurrent    Allergy    Anxiety    Appendicitis    Crohn's disease of colon (HCC) 08/15/2013   sees Dr. Vina Rattler at [facility]    Dysmenorrhea    GERD (gastroesophageal reflux disease)     not taking any medication   IIH (idiopathic intracranial hypertension)    I have been cleared of that since July 2025   Shortness of breath dyspnea    ith thyroid  problem    Past Surgical History:  Procedure Laterality Date   APPENDECTOMY  07-14-11   laparoscopic    CHOLECYSTECTOMY  02/11/2012   Procedure: LAPAROSCOPIC CHOLECYSTECTOMY WITH INTRAOPERATIVE CHOLANGIOGRAM;  Surgeon: Lynwood MALVA Pina, MD;  Location: [facility] OR;  Service: General;  Laterality: N/A;   ESOPHAGOGASTRODUODENOSCOPY  01/28/2012   Procedure: ESOPHAGOGASTRODUODENOSCOPY (EGD);  Surgeon: Fairy VEAR Gaskins, MD;  Location: [facility] OR;  Service: Gastroenterology;  Laterality: N/A;   THYROID  LOBECTOMY Left 04/29/2015   Procedure: LEFT TOTAL THYROID  LOBECTOMY;  Surgeon: Lynwood Pina, MD;  Location: [facility] OR;  Service: General;  Laterality: Left;   WISDOM TOOTH EXTRACTION       Current Outpatient Medications  Medication Sig Dispense Refill   hyoscyamine  (LEVSIN  SL) 0.125 MG SL tablet Place 1 tablet (0.125 mg total) under the tongue 3 (three) times daily before meals. 90 tablet 0   ondansetron  (ZOFRAN -ODT) 4 MG disintegrating tablet Take 1 tablet (4 mg total) by mouth every 8 (eight) hours as needed for up to 12 doses. 12 tablet 0   dicyclomine  (BENTYL ) 20 MG tablet Take 1 tablet (20 mg total) by mouth 2 (two) times daily as needed for up to 20 days for spasms. (Patient not taking: No sig reported) 20 tablet 0   diphenoxylate -atropine  (LOMOTIL ) 2.5-0.025 MG tablet Take 2 tablets by mouth 4 (four) times daily  as needed for diarrhea or loose stools. (Patient not taking: Reported on 10/18/2024) 100 tablet 0   furosemide  (LASIX ) 20 MG tablet TAKE 1 TABLET BY MOUTH EVERY DAY (Patient not taking: Reported on 10/18/2024) 90 tablet 0   metoCLOPramide  (REGLAN ) 10 MG tablet Take 1 tablet (10 mg total) by mouth every 6 (six) hours for 2 days. 8 tablet 0   Potassium Chloride  ER 20 MEQ TBCR Take 1 tablet (20 mEq total) by mouth daily. (Patient not taking:  Reported on 10/18/2024) 2 tablet 0   Current Facility-Administered Medications  Medication Dose Route Frequency Provider Last Rate Last Admin   0.9 %  sodium chloride  infusion  500 mL Intravenous Once Avram Lupita BRAVO, MD       0.9 %  sodium chloride  infusion  500 mL Intravenous Once Avram Lupita BRAVO, MD        Allergies as of 10/18/2024 - Review Complete 10/18/2024  Allergen Reaction Noted   Penicillins Shortness Of Breath, Nausea Only, and Other (See Comments) 01/12/2012   Prednisone Hives 03/08/2014   Fentanyl  Other (See Comments) 01/19/2015   Hydrocodone  Nausea And Vomiting 10/05/2024   Morphine  and codeine Nausea And Vomiting and Other (See Comments) 12/04/2014   Oxycontin  [oxycodone  hcl] Nausea And Vomiting 04/17/2012   Valium  [diazepam ] Other (See Comments) 05/02/2015    Family History  Problem Relation Age of Onset   Cancer Paternal Aunt        ovarian, kidney   Cancer Paternal Uncle        colon   Cancer Maternal Grandmother        breast    Hypertension Maternal Grandmother    Irritable bowel syndrome Maternal Grandmother    Heart disease Maternal Grandmother    Hypertension Paternal Grandmother    Hyperlipidemia Paternal Grandmother    Colon cancer Paternal Grandfather    Esophageal cancer Neg Hx    Rectal cancer Neg Hx    Stomach cancer Neg Hx     Social History   Socioeconomic History   Marital status: Single    Spouse name: Not on file   Number of children: 0   Years of education: 14   Highest education level: Not on file  Occupational History   Occupation: [employer]   Occupation: [employer]  Tobacco Use   Smoking status: Never   Smokeless tobacco: Never  Vaping Use   Vaping status: Never Used  Substance and Sexual Activity   Alcohol use: Yes    Comment: weekends   Drug use: No   Sexual activity: Never  Other Topics Concern   Not on file  Social History Narrative   Right handed   Caffeine use: coffee/soda (coffee 2-3 times per week, soda  daily)   Social Drivers of Corporate Investment Banker Strain: Not on file  Food Insecurity: Not on file  Transportation Needs: Not on file  Physical Activity: Not on file  Stress: Not on file  Social Connections: Unknown (03/26/2022)   [facility]   Social Network    Social Network: Not on file  Intimate Partner Violence: Unknown (02/16/2022)   Received from [facility]   HITS    Physically Hurt: Not on file    Insult or Talk Down To: Not on file    Threaten Physical Harm: Not on file    Scream or Curse: Not on file    Review of Systems: Positive for *** All other review of systems negative except as mentioned in  the HPI.  Physical Exam: Vital signs BP 121/86   Pulse (!) 102   Temp (!) 97.3 F (36.3 C) (Skin)   Resp 15   Ht 5' 7 (1.702 m)   Wt 222 lb (100.7 kg)   LMP 09/30/2024 (Exact Date)   SpO2 100%   BMI 34.77 kg/m   General:   Alert,  Well-developed, well-nourished, pleasant and cooperative in NAD Lungs:  Clear throughout to auscultation.   Heart:  Regular rate and rhythm; no murmurs, clicks, rubs,  or gallops. Abdomen:  Soft, nontender and nondistended. Normal bowel sounds.   Neuro/Psych:  Alert and cooperative. Normal mood and affect. A and O x 3   @Arath Kaigler  CHARLENA Commander, MD, [facility] [phone] (pager) 10/18/2024 3:43 PM@

## 2024-10-18 NOTE — Progress Notes (Signed)
 Pt's states no medical or surgical changes since previsit or office visit.

## 2024-10-18 NOTE — Op Note (Addendum)
  [facility] Patient Name: Julie Ward Procedure Date: 10/18/2024 3:31 PM MRN: [id number] Endoscopist: Lupita FORBES Commander , MD, [id number] Age: 28 Referring MD:  Date of Birth: June 08, 1996 Gender: Female Account #: [id number] Procedure:                Colonoscopy Indications:              Lower abdominal pain, Clinically significant                            diarrhea of unexplained origin 2014 had ileocolitis                            (terminal ileum and cecum) Also having persistent                            nausea and vomiting EGD was NL Medicines:                Monitored Anesthesia Care Procedure:                Pre-Anesthesia Assessment:                           - Prior to the procedure, a History and Physical                            was performed, and patient medications and                            allergies were reviewed. The patient's tolerance of                            previous anesthesia was also reviewed. The risks                            and benefits of the procedure and the sedation                            options and risks were discussed with the patient.                            All questions were answered, and informed consent                            was obtained. Prior Anticoagulants: The patient has                            taken no anticoagulant or antiplatelet agents. ASA                            Grade Assessment: II - A patient with mild systemic                            disease. After reviewing the risks and benefits,  the patient was deemed in satisfactory condition to                            undergo the procedure.                           After obtaining informed consent, the colonoscope                            was passed under direct vision. Throughout the                            procedure, the patient's blood pressure, pulse, and                            oxygen saturations were  monitored continuously. The                            Olympus Scope SN: [id number] was introduced through                            the anus and advanced to the the terminal ileum,                            with identification of the appendiceal orifice and                            IC valve. The colonoscopy was performed without                            difficulty. The patient tolerated the procedure                            well. The quality of the bowel preparation was                            adequate. The terminal ileum, ileocecal valve,                            appendiceal orifice, and rectum were photographed.                            The bowel preparation used was SUPREP via split                            dose instruction. Scope In: 3:48:50 PM Scope Out: 4:01:50 PM Scope Withdrawal Time: 0 hours 9 minutes 45 seconds  Total Procedure Duration: 0 hours 13 minutes 0 seconds  Findings:                 The perianal and digital rectal examinations were                            normal.  The terminal ileum appeared normal. Biopsies were                            taken with a cold forceps for histology.                            Verification of patient identification for the                            specimen was done. Estimated blood loss was minimal.                           A patchy area of mildly erythematous mucosa was                            found in the entire colon. Biopsies were taken with                            a cold forceps for histology. Verification of                            patient identification for the specimen was done.                            Estimated blood loss was minimal.                           The exam was otherwise without abnormality on                            direct and retroflexion views. Complications:            No immediate complications. Estimated Blood Loss:     Estimated blood loss was  minimal. Impression:               - The examined portion of the ileum was normal.                            Biopsied. No signs of Crohn's disease                           - Erythematous mucosa in the entire examined colon.                            Biopsied. This looks more like prep effect as                            opposed to colitis.                           - The examination was otherwise normal on direct                            and retroflexion views. Recommendation:           -  Patient has a contact number available for                            emergencies. The signs and symptoms of potential                            delayed complications were discussed with the                            patient. Return to normal activities tomorrow.                            Written discharge instructions were provided to the                            patient.                           - Resume previous diet.                           - Continue present medications. She is to try                            dicyclomine  30 mins before meals                           - Await pathology results.                           - No recommendation at this time regarding repeat                            colonoscopy due to young age. Lupita FORBES Commander, MD 10/18/2024 4:20:29 PM This report has been signed electronically.

## 2024-10-18 NOTE — Patient Instructions (Addendum)
 There were some patchy areas of red discoloration in the colon.  This is not specific for any problem but I took biopsies looking for colitis and I also took biopsies of the small intestine which looked normal.     Once I get the biopsy results I will contact you and we will determine the next steps.  I appreciate the opportunity to care for you. [name], MD, FACG   YOU HAD AN ENDOSCOPIC PROCEDURE TODAY AT THE [facility]:   Refer to the procedure report that was given to you for any specific questions about what was found during the examination.  If the procedure report does not answer your questions, please call your gastroenterologist to clarify.  If you requested that your care partner not be given the details of your procedure findings, then the procedure report has been included in a sealed envelope for you to review at your convenience later.  YOU SHOULD EXPECT: Some feelings of bloating in the abdomen. Passage of more gas than usual.  Walking can help get rid of the air that was put into your GI tract during the procedure and reduce the bloating. If you had a lower endoscopy (such as a colonoscopy or flexible sigmoidoscopy) you may notice spotting of blood in your stool or on the toilet paper. If you underwent a bowel prep for your procedure, you may not have a normal bowel movement for a few days.  Please Note:  You might notice some irritation and congestion in your nose or some drainage.  This is from the oxygen used during your procedure.  There is no need for concern and it should clear up in a day or so.  SYMPTOMS TO REPORT IMMEDIATELY:  Following lower endoscopy (colonoscopy or flexible sigmoidoscopy):  Excessive amounts of blood in the stool  Significant tenderness or worsening of abdominal pains  Swelling of the abdomen that is new, acute  Fever of 100F or higher  For urgent or emergent issues, a gastroenterologist can be reached at any hour by calling  [phone]. Do not use MyChart messaging for urgent concerns.    DIET:  We do recommend a small meal at first, but then you may proceed to your regular diet.  Drink plenty of fluids but you should avoid alcoholic beverages for 24 hours.  ACTIVITY:  You should plan to take it easy for the rest of today and you should NOT DRIVE or use heavy machinery until tomorrow (because of the sedation medicines used during the test).    FOLLOW UP: Our staff will call the number listed on your records the next business day following your procedure.  We will call around 7:15- 8:00 am to check on you and address any questions or concerns that you may have regarding the information given to you following your procedure. If we do not reach you, we will leave a message.     If any biopsies were taken you will be contacted by phone or by letter within the next 1-3 weeks.  Please call us  at [phone] if you have not heard about the biopsies in 3 weeks.    SIGNATURES/CONFIDENTIALITY: You and/or your care partner have signed paperwork which will be entered into your electronic medical record.  These signatures attest to the fact that that the information above on your After Visit Summary has been reviewed and is understood.  Full responsibility of the confidentiality of this discharge information lies with you and/or your care-partner.

## 2024-10-19 ENCOUNTER — Telehealth: Payer: Self-pay | Admitting: *Deleted

## 2024-10-19 ENCOUNTER — Telehealth: Payer: Self-pay

## 2024-10-19 NOTE — Telephone Encounter (Signed)
 Patient called back, requesting a call back to discuss questions she has. Please advise.

## 2024-10-19 NOTE — Telephone Encounter (Signed)
 Patient called with a couple of questions, she wanted to now why her stomach was sore. I explained that sometimes we have to push on the exterior of the abdomen in order to help guide the scope. She also wanted to know what she could eat today. I went over her instructions about eating a low fiber diet. She also discussed about her nausea and I advised her to try the medication he wanted her to trial. She agreed and said she would. No further questions.

## 2024-10-19 NOTE — Telephone Encounter (Signed)
 No answer for follow up call. Left a message.

## 2024-10-21 NOTE — Telephone Encounter (Signed)
 Patient called this morning stating that she had undergone colonoscopy with Dr. Avram on 10/18/2024. She has been having continued problems with intermittent nausea and vomiting, feels dehydrated  Colonoscopy report reviewed, mild rather diffuse erythema, biopsies taken and pending but felt most likely to represent poor prep effect.  Recent EGD negative  Patient reports no fever or chills, no abdominal pain, no diarrhea at this time.  Intermittent nausea and vomiting since Friday.  She has Zofran  tablets at home but has not been taking those on a regular basis.  Did take 1 on Friday.  Having a difficult time eating and pushing p.o.'s.   Advised to start taking Zofran  4 mg every 6 hours and take around-the-clock over the next few days and attempt to avert persistent vomiting.  Advised to try to push fluids and gradually advance diet. Advised patient that if she feels dehydrated and she needs to go to an emergency room for labs and may need fluids. She had had 1 ER visit within the past week. She was not inclined to return to the emergency room.  She did not seem satisfied with advice to just continue Zofran .  Asked that Dr. Avram be notified of her persistent symptoms

## 2024-10-22 ENCOUNTER — Encounter: Admitting: Internal Medicine

## 2024-10-22 ENCOUNTER — Encounter: Payer: Self-pay | Admitting: Internal Medicine

## 2024-10-23 LAB — SURGICAL PATHOLOGY

## 2024-10-24 ENCOUNTER — Telehealth: Payer: Self-pay | Admitting: Internal Medicine

## 2024-10-24 ENCOUNTER — Other Ambulatory Visit: Payer: Self-pay | Admitting: Internal Medicine

## 2024-10-24 ENCOUNTER — Ambulatory Visit: Payer: Self-pay | Admitting: Internal Medicine

## 2024-10-24 MED ORDER — BUDESONIDE 3 MG PO CPEP: 9.0000 mg | ORAL_CAPSULE | Freq: Every day | ORAL | 1 refills | Status: DC

## 2024-10-24 NOTE — Telephone Encounter (Signed)
 The pt has been instructed to take 9 mg of budesonide  daily (3 capsules at once)  All questions answered to the best of my ability

## 2024-10-24 NOTE — Telephone Encounter (Signed)
 Looks like you were working with this patient on her medicines.

## 2024-10-24 NOTE — Telephone Encounter (Signed)
 Patient requesting f/u call in regards to budesonide . Please advise

## 2024-10-25 ENCOUNTER — Telehealth: Payer: Self-pay | Admitting: Internal Medicine

## 2024-10-25 NOTE — Telephone Encounter (Signed)
 The pt started budesonide  yesterday and wants to make sure we note that she has heartburn.  We discussed  anti reflux precautions and she will try pepcid  OTC and call back if these changes do not help.

## 2024-10-25 NOTE — Telephone Encounter (Signed)
 Spoke w pt about new medication she was prescribed (budesonide ) . Pt states that medication is giving her heart burn. Pt requesting a call back for care. Please advise. Thank you.

## 2024-10-29 ENCOUNTER — Telehealth: Payer: Self-pay

## 2024-10-29 MED ORDER — ONDANSETRON 4 MG PO TBDP: 4.0000 mg | ORAL_TABLET | Freq: Three times a day (TID) | ORAL | 1 refills | Status: DC

## 2024-10-29 NOTE — Telephone Encounter (Signed)
 The budesonide  will take some time to work.  I think there is more going on than just her gastrointestinal problems (high stress).  She should try taking Zofran  before every meal.  I sent in a new prescription.  Will review further at her visit on the 18th.

## 2024-10-29 NOTE — Telephone Encounter (Signed)
 Patient contacted on call doctor. Complains of her medication not working. Contacted the patient. She reports continued fatigue, vomiting without nausea and some abdominal cramping. States the cramping is not as bad as it was previously. She is most concerned with not being able to tolerate any PO's. Last took Zofran  this morning after vomiting ginger ale.

## 2024-10-29 NOTE — Telephone Encounter (Signed)
 Patient advised. Very reluctantly agrees to this plan of care.

## 2024-11-01 ENCOUNTER — Ambulatory Visit: Payer: Self-pay | Admitting: Internal Medicine

## 2024-11-01 ENCOUNTER — Telehealth: Payer: Self-pay | Admitting: Internal Medicine

## 2024-11-01 ENCOUNTER — Other Ambulatory Visit

## 2024-11-01 ENCOUNTER — Encounter: Payer: Self-pay | Admitting: Internal Medicine

## 2024-11-01 ENCOUNTER — Ambulatory Visit: Admitting: Internal Medicine

## 2024-11-01 VITALS — BP 120/74 | HR 91 | Ht 67.0 in | Wt 201.2 lb

## 2024-11-01 DIAGNOSIS — R1115 Cyclical vomiting syndrome unrelated to migraine: Secondary | ICD-10-CM | POA: Diagnosis not present

## 2024-11-01 DIAGNOSIS — R109 Unspecified abdominal pain: Secondary | ICD-10-CM | POA: Diagnosis not present

## 2024-11-01 DIAGNOSIS — G4700 Insomnia, unspecified: Secondary | ICD-10-CM | POA: Diagnosis not present

## 2024-11-01 DIAGNOSIS — K50119 Crohn's disease of large intestine with unspecified complications: Secondary | ICD-10-CM

## 2024-11-01 DIAGNOSIS — K3184 Gastroparesis: Secondary | ICD-10-CM | POA: Diagnosis not present

## 2024-11-01 DIAGNOSIS — E876 Hypokalemia: Secondary | ICD-10-CM

## 2024-11-01 DIAGNOSIS — R630 Anorexia: Secondary | ICD-10-CM | POA: Diagnosis not present

## 2024-11-01 DIAGNOSIS — R0982 Postnasal drip: Secondary | ICD-10-CM

## 2024-11-01 DIAGNOSIS — K30 Functional dyspepsia: Secondary | ICD-10-CM

## 2024-11-01 LAB — COMPREHENSIVE METABOLIC PANEL WITH GFR
ALT: 50 U/L — ABNORMAL HIGH (ref 3–35)
AST: 30 U/L (ref 5–37)
Albumin: 4.5 g/dL (ref 3.5–5.2)
Alkaline Phosphatase: 59 U/L (ref 39–117)
BUN: 6 mg/dL (ref 6–23)
CO2: 32 meq/L (ref 19–32)
Calcium: 9.9 mg/dL (ref 8.4–10.5)
Chloride: 87 meq/L — ABNORMAL LOW (ref 96–112)
Creatinine, Ser: 0.7 mg/dL (ref 0.40–1.20)
GFR: 116.11 mL/min (ref >60.00)
Glucose, Bld: 121 mg/dL — ABNORMAL HIGH (ref 70–99)
Potassium: 2.7 meq/L — CL (ref 3.5–5.1)
Sodium: 134 meq/L — ABNORMAL LOW (ref 135–145)
Total Bilirubin: 1 mg/dL (ref 0.2–1.2)
Total Protein: 9.2 g/dL — ABNORMAL HIGH (ref 6.0–8.3)

## 2024-11-01 LAB — CBC WITH DIFFERENTIAL/PLATELET
Basophils Absolute: 0 K/uL (ref 0.0–0.1)
Basophils Relative: 0.5 % (ref 0.0–3.0)
Eosinophils Absolute: 0 K/uL (ref 0.0–0.7)
Eosinophils Relative: 0.1 % (ref 0.0–5.0)
HCT: 40.5 % (ref 36.0–46.0)
Hemoglobin: 14 g/dL (ref 12.0–15.0)
Lymphocytes Relative: 18 % (ref 12.0–46.0)
Lymphs Abs: 1.3 K/uL (ref 0.7–4.0)
MCHC: 34.6 g/dL (ref 30.0–36.0)
MCV: 86.3 fl (ref 78.0–100.0)
Monocytes Absolute: 1 K/uL (ref 0.1–1.0)
Monocytes Relative: 14.5 % — ABNORMAL HIGH (ref 3.0–12.0)
Neutro Abs: 4.8 K/uL (ref 1.4–7.7)
Neutrophils Relative %: 66.9 % (ref 43.0–77.0)
Platelets: 341 K/uL (ref 150.0–400.0)
RBC: 4.7 Mil/uL (ref 3.87–5.11)
RDW: 13.4 % (ref 11.5–15.5)
WBC: 7.1 K/uL (ref 4.0–10.5)

## 2024-11-01 LAB — SEDIMENTATION RATE: Sed Rate: 73 mm/h — ABNORMAL HIGH (ref 0–20)

## 2024-11-01 MED ORDER — POTASSIUM CHLORIDE CRYS ER 20 MEQ PO TBCR: 20.0000 meq | EXTENDED_RELEASE_TABLET | Freq: Two times a day (BID) | ORAL | 1 refills | Status: DC

## 2024-11-01 MED ORDER — METOCLOPRAMIDE HCL 10 MG PO TABS: 10.0000 mg | ORAL_TABLET | Freq: Three times a day (TID) | ORAL | 1 refills | Status: DC

## 2024-11-01 MED ORDER — MIRTAZAPINE 7.5 MG PO TABS: 7.5000 mg | ORAL_TABLET | Freq: Every day | ORAL | 2 refills | Status: DC

## 2024-11-01 MED ORDER — CETIRIZINE HCL 10 MG PO TABS: 10.0000 mg | ORAL_TABLET | Freq: Every day | ORAL | Status: DC

## 2024-11-01 NOTE — Telephone Encounter (Signed)
 Pt advised that no sooner appts available. She has been advised that if she is spitting up blood and not able to make it to her appt this afternoon she should be seen in the ED for evaluation.  She states that she spit up some pink and is anxious and worried. She states she will wait for appt today at 1:30 pm. I did make her aware that she should go to the ED is she worsens in the meantime. The pt has been advised of the information and verbalized understanding.

## 2024-11-01 NOTE — Progress Notes (Signed)
 Julie Ward 28 y.o. 31-Jan-1996 [id number]  Assessment & Plan:   Encounter Diagnoses  Name Primary?   Persistent vomiting Yes   Anorexia    Crohn's disease of large intestine with complication (HCC)    Insomnia, unspecified type    Post-nasal drip    Delayed gastric emptying    Abdominal wall pain     Continues to struggle with eating, vomiting.  Spitting constantly has spit up some blood.  Normal EGD recently so I think this might be slight trauma from retching.  Abdominal wall pain from retching.  Probably some pain related to colitis but on exam clearly largely abdominal wall pain now.  I think if we can get her vomiting stopped she will do a lot better.     - Continue budesonide  as tolerated.  Consider moved to prednisone pending clinical course. - Ordered labs for electrolytes and relevant parameters. - Provided dietary instructions for liquid intake and gradual reintroduction of soft foods and advancing diet.  Gastroparesis diet sheet provided.  - Prescribed Reglan  10 mg TID before meals.  Dehydration likely Secondary to inadequate fluid intake and vomiting. Contributing to fatigue and abdominal pain. - Encouraged increased fluid intake with Boost and Ensure. - Ordered labs for hydration status and electrolyte balance.  Insomnia and poor appetite Chronic insomnia and poor appetite likely due to gastrointestinal symptoms and possible anxiety. - Prescribed mirtazapine  at bedtime. - Provided reassurance on mirtazapine  safety and efficacy.  Generic Zyrtec for postnasal drip.  Heating pad bracing abdomen for abdominal wall pain.  May try Tylenol .  Challenging to provide pain relief as she has intolerances to narcotics.  Would like to avoid NSAIDs at this time.  Orders Placed This Encounter  Procedures   Comprehensive metabolic panel with GFR   CBC with Differential/Platelet   Sedimentation rate   Meds ordered this encounter  Medications   mirtazapine  (REMERON )  7.5 MG tablet    Sig: Take 1 tablet (7.5 mg total) by mouth at bedtime.    Dispense:  30 tablet    Refill:  2   metoCLOPramide  (REGLAN ) 10 MG tablet    Sig: Take 1 tablet (10 mg total) by mouth 3 (three) times daily before meals.    Dispense:  90 tablet    Refill:  1   cetirizine (ZYRTEC ALLERGY) 10 MG tablet    Sig: Take 1 tablet (10 mg total) by mouth daily.   potassium chloride  SA (KLOR-CON  M) 20 MEQ tablet    Sig: Take 1 tablet (20 mEq total) by mouth 2 (two) times daily.    Dispense:  60 tablet    Refill:  1   She is returning in approximately 1 month.  Labs have returned with significant hypokalemia and I sent a prescription for potassium chloride  and we will call her about that.  Lab Results  Component Value Date   NA 134 (L) 11/01/2024   CL 87 (L) 11/01/2024   K 2.7 (LL) 11/01/2024   CO2 32 11/01/2024   BUN 6 11/01/2024   CREATININE 0.70 11/01/2024   GFR 116.11 11/01/2024   CALCIUM 9.9 11/01/2024   ALBUMIN 4.5 11/01/2024   GLUCOSE 121 (H) 11/01/2024   Lab Results  Component Value Date   ALT 50 (H) 11/01/2024   AST 30 11/01/2024   ALKPHOS 59 11/01/2024   BILITOT 1.0 11/01/2024    Lab Results  Component Value Date   WBC 7.1 11/01/2024   HGB 14.0 11/01/2024   HCT 40.5 11/01/2024  MCV 86.3 11/01/2024   PLT 341.0 11/01/2024   Lab Results  Component Value Date   ESRSEDRATE 70 (H) 11/01/2024   CC: Johnny Garnette LABOR, MD   Subjective:  Gastroenterology summary:  Crohn's ileocolitis diagnosed October 2014.  Initial complaints were abdominal pain and nausea.  Treated with budesonide  for several years, seen at [facility] then lost to follow-up since 2018 resurfaced late 2025 [facility]  Chronic recurrent nausea and vomiting, delayed gastric emptying at 2 hours on 4-hour emptying study 82% retention at 2 hours normal at 4 hours  Suspected IBS overlap  EGD 10/05/2024-normal EGD 2015 at [facility], celiac biopsies negative for celiac disease focal nonspecific chronic  duodenitis  Colonoscopy 10/18/2024 normal terminal ileum and biopsies, erythematous mucosa in the entire examined colon biopsy showed focal active colitis in the ascending and transverse, active colitis with crypt abscesses in the rectum sigmoid and transverse.  No granulomata.  No chronic changes.  MR enterography 2016 normal   ------------------------------------------------------------------------------------------  Chief Complaint: Abdominal pain vomiting  HPI 28 year old woman with a history of Crohn's disease of the ileum and right colon previously now with colonic disease, persistent vomiting, loss of weight and anorexia.   Discussed the use of AI scribe software for clinical note transcription with the patient, who gave verbal consent to proceed.  Julie Ward presents with her mother who provides part of the history and is involved in the visit today.  Vomiting - Persistent vomiting occurring after eating, frequency ranging from once to five times daily - Vomitus described as 'stomach bile' and occasionally contains blood - No associated nausea - Significantly impacts ability to maintain nutrition and hydration  Gastrointestinal symptoms - History of Crohn's disease with recurrent similar symptoms - Currently taking budesonide , but only able to intermittently retain medication - Constipation attributed to Zofran , with absence of regular bowel movements  Urinary symptoms - Decreased frequency of urination - Urine noted to have a strong odor  Constitutional symptoms - Severely disrupted sleep, only a few hours of rest at night - Constant fatigue - Shortness of breath with minimal exertion - Overall poor quality of life  Sinus drainage - No fever, back pain, or respiratory symptoms such as runny nose or sneezing - Presence of sinus drainage, which is expectorated throughout the day  Pain management - Uses Tylenol  for pain control - Not a fan of pain medications she has  multiple intolerances to narcotics with nausea and vomiting      Wt Readings from Last 3 Encounters:  11/01/24 201 lb 4 oz (91.3 kg)  10/18/24 222 lb (100.7 kg)  10/05/24 222 lb (100.7 kg)     Allergies[1] Active Medications[2] Past Medical History:  Diagnosis Date   Abdominal pain, recurrent    Allergy    Anxiety    Appendicitis    Crohn's disease of colon (HCC) 08/15/2013   sees Dr. Vina Rattler at [facility]    Dysmenorrhea    GERD (gastroesophageal reflux disease)    not taking any medication   IIH (idiopathic intracranial hypertension)    I have been cleared of that since July 2025   Shortness of breath dyspnea    ith thyroid  problem   Past Surgical History:  Procedure Laterality Date   APPENDECTOMY  07-14-11   laparoscopic    CHOLECYSTECTOMY  02/11/2012   Procedure: LAPAROSCOPIC CHOLECYSTECTOMY WITH INTRAOPERATIVE CHOLANGIOGRAM;  Surgeon: Lynwood MALVA Pina, MD;  Location: [facility] OR;  Service: General;  Laterality: N/A;   ESOPHAGOGASTRODUODENOSCOPY  01/28/2012   Procedure:  ESOPHAGOGASTRODUODENOSCOPY (EGD);  Surgeon: Fairy VEAR Gaskins, MD;  Location: [facility] OR;  Service: Gastroenterology;  Laterality: N/A;   THYROID  LOBECTOMY Left 04/29/2015   Procedure: LEFT TOTAL THYROID  LOBECTOMY;  Surgeon: Lynwood Pina, MD;  Location: [facility] OR;  Service: General;  Laterality: Left;   WISDOM TOOTH EXTRACTION     Social History   Social History Narrative   Right handed   Caffeine use: coffee/soda (coffee 2-3 times per week, soda daily)   family history includes Cancer in her maternal grandmother, paternal aunt, and paternal uncle; Colon cancer in her paternal grandfather; Heart disease in her maternal grandmother; Hyperlipidemia in her paternal grandmother; Hypertension in her maternal grandmother and paternal grandmother; Irritable bowel syndrome in her maternal grandmother.   Review of Systems As per HPI.  Objective:   Physical Exam BP 120/74   Pulse 91   Ht 5' 7 (1.702 m)   Wt 201 lb 4  oz (91.3 kg)   LMP 09/30/2024 (Exact Date)   BMI 31.52 kg/m  Flat affect Constantly spitting saliva [place]/pharynx are clear w/o trauma Lungs cta Cor NL Abd soft - diffusely tender lower > iupper w/ ++ carnett's  Data reviewed please see HPI and gastroenterology summary   I spent greater than 40 minutes of time, including in depth chart review, independent review of results as outlined above, communicating results with the patient directly, face-to-face time with the patient, coordinating care, ordering studies and medications as appropriate, and documentation.      [1]  Allergies Allergen Reactions   Penicillins Shortness Of Breath, Nausea Only and Other (See Comments)    Made her sick/Childhood Allergy Has patient had a PCN reaction causing immediate rash, facial/tongue/throat swelling, SOB or lightheadedness with hypotension:yes Has patient had a PCN reaction causing severe rash involving mucus membranes or skin necrosis: No Has patient had a PCN reaction that required hospitalization: No Has patient had a PCN reaction occurring within the last 10 years:No .   Prednisone Hives   Fentanyl  Other (See Comments)    anxiety   Hydrocodone  Nausea And Vomiting   Morphine  And Codeine Nausea And Vomiting and Other (See Comments)    Caused stomach to burn    Oxycontin  [Oxycodone  Hcl] Nausea And Vomiting    vomiting   Valium  [Diazepam ] Other (See Comments)    Hyperactivity   [2]  Current Meds  Medication Sig   budesonide  (ENTOCORT EC ) 3 MG 24 hr capsule Take 3 capsules (9 mg total) by mouth daily.   hyoscyamine  (LEVSIN  SL) 0.125 MG SL tablet Place 1 tablet (0.125 mg total) under the tongue 3 (three) times daily before meals.   metoCLOPramide  (REGLAN ) 10 MG tablet Take 1 tablet (10 mg total) by mouth 3 (three) times daily before meals.   mirtazapine  (REMERON ) 7.5 MG tablet Take 1 tablet (7.5 mg total) by mouth at bedtime.   ondansetron  (ZOFRAN -ODT) 4 MG disintegrating tablet Take  1 tablet (4 mg total) by mouth 3 (three) times daily before meals.

## 2024-11-01 NOTE — Telephone Encounter (Signed)
 Patient called requesting to know if there were any sooner emergency appointments available for today. States she spit up blood and is very worried. Patient is scheduled at 1:30 today. Patient is requesting a call back as soon as possible. Please advise, thank you

## 2024-11-01 NOTE — Patient Instructions (Addendum)
°  VISIT SUMMARY: During your visit, we discussed your ongoing issues with Crohn's disease, persistent vomiting, and gastrointestinal symptoms. We also addressed your dehydration, insomnia, and poor appetite.  -Continue taking budesonide  as tolerated. -We have ordered lab tests to check your electrolytes and other relevant parameters. -Follow the dietary instructions provided, focusing on liquid intake and gradually reintroducing soft foods.  You have delayed gastric emptying, which may be worsened by your Crohn's disease. -Take Reglan  three times a day before meals. - Start with step 1 diet in Gastroparesis handout and move to the othe steps when possible You could be dehydrated due to inadequate fluid intake and vomiting, which is contributing to your fatigue and abdominal pain. -Increase your fluid intake and use  drinks like Boost and Ensure. -We have ordered lab tests to check your hydration status and electrolyte balance.  INSOMNIA AND POOR APPETITE:  -Take mirtazapine  at bedtime. -We have discussed the safety and effectiveness of mirtazapine  to help with your symptoms.  Use a heating pad and brace abdominal wall for the abdominal wall pain. OK to use Tylenol .  Take Zyrtec for post nasal drip. Do not need prescription for this.  I appreciate the opportunity to care for you. Lupita Commander, MD, [facility]                      Contains text generated by Abridge.                                 Contains text generated by Abridge.

## 2024-11-02 ENCOUNTER — Emergency Department (HOSPITAL_BASED_OUTPATIENT_CLINIC_OR_DEPARTMENT_OTHER)
Admission: EM | Admit: 2024-11-02 | Discharge: 2024-11-03 | Disposition: A | Discharge status: Home / Self Care | Attending: Emergency Medicine | Admitting: Emergency Medicine

## 2024-11-02 ENCOUNTER — Encounter (HOSPITAL_BASED_OUTPATIENT_CLINIC_OR_DEPARTMENT_OTHER): Payer: Self-pay

## 2024-11-02 ENCOUNTER — Other Ambulatory Visit: Payer: Self-pay

## 2024-11-02 DIAGNOSIS — R1111 Vomiting without nausea: Secondary | ICD-10-CM

## 2024-11-02 DIAGNOSIS — F419 Anxiety disorder, unspecified: Secondary | ICD-10-CM | POA: Insufficient documentation

## 2024-11-02 DIAGNOSIS — E876 Hypokalemia: Secondary | ICD-10-CM | POA: Diagnosis not present

## 2024-11-02 DIAGNOSIS — R064 Hyperventilation: Secondary | ICD-10-CM | POA: Diagnosis not present

## 2024-11-02 DIAGNOSIS — R112 Nausea with vomiting, unspecified: Secondary | ICD-10-CM | POA: Diagnosis not present

## 2024-11-02 HISTORY — DX: Noninfective gastroenteritis and colitis, unspecified: K52.9

## 2024-11-02 LAB — COMPREHENSIVE METABOLIC PANEL WITH GFR
ALT: 58 U/L — ABNORMAL HIGH (ref 0–44)
AST: 44 U/L — ABNORMAL HIGH (ref 15–41)
Albumin: 4.5 g/dL (ref 3.5–5.0)
Alkaline Phosphatase: 68 U/L (ref 38–126)
Anion gap: 27 — ABNORMAL HIGH (ref 5–15)
BUN: <5 mg/dL — ABNORMAL LOW (ref 6–20)
CO2: 21 mmol/L — ABNORMAL LOW (ref 22–32)
Calcium: 9.4 mg/dL (ref 8.9–10.3)
Chloride: 85 mmol/L — ABNORMAL LOW (ref 98–111)
Creatinine, Ser: 0.91 mg/dL (ref 0.44–1.00)
GFR, Estimated: >60 mL/min
Glucose, Bld: 148 mg/dL — ABNORMAL HIGH (ref 70–99)
Potassium: 2.8 mmol/L — ABNORMAL LOW (ref 3.5–5.1)
Sodium: 132 mmol/L — ABNORMAL LOW (ref 135–145)
Total Bilirubin: 1 mg/dL (ref 0.0–1.2)
Total Protein: 9.1 g/dL — ABNORMAL HIGH (ref 6.5–8.1)

## 2024-11-02 LAB — CBC WITH DIFFERENTIAL/PLATELET
Abs Immature Granulocytes: 0.03 K/uL (ref 0.00–0.07)
Basophils Absolute: 0 K/uL (ref 0.0–0.1)
Basophils Relative: 1 %
Eosinophils Absolute: 0 K/uL (ref 0.0–0.5)
Eosinophils Relative: 0 %
HCT: 40 % (ref 36.0–46.0)
Hemoglobin: 14.3 g/dL (ref 12.0–15.0)
Immature Granulocytes: 0 %
Lymphocytes Relative: 32 %
Lymphs Abs: 2.6 K/uL (ref 0.7–4.0)
MCH: 29.7 pg (ref 26.0–34.0)
MCHC: 35.8 g/dL (ref 30.0–36.0)
MCV: 83 fL (ref 80.0–100.0)
Monocytes Absolute: 1.3 K/uL — ABNORMAL HIGH (ref 0.1–1.0)
Monocytes Relative: 15 %
Neutro Abs: 4.2 K/uL (ref 1.7–7.7)
Neutrophils Relative %: 52 %
Platelets: 342 K/uL (ref 150–400)
RBC: 4.82 MIL/uL (ref 3.87–5.11)
RDW: 12.6 % (ref 11.5–15.5)
WBC: 8.1 K/uL (ref 4.0–10.5)
nRBC: 0 % (ref 0.0–0.2)

## 2024-11-02 LAB — MAGNESIUM: Magnesium: 1.6 mg/dL — ABNORMAL LOW (ref 1.7–2.4)

## 2024-11-02 MED ORDER — LACTATED RINGERS IV BOLUS
1000.0000 mL | Freq: Once | INTRAVENOUS | Status: AC
  Administered 2024-11-02: 1000 mL via INTRAVENOUS

## 2024-11-02 MED ORDER — MAGNESIUM SULFATE 2 GM/50ML IV SOLN
2.0000 g | Freq: Once | INTRAVENOUS | Status: AC
  Administered 2024-11-02: 2 g via INTRAVENOUS
  Filled 2024-11-02: qty 50

## 2024-11-02 MED ORDER — POTASSIUM CHLORIDE CRYS ER 20 MEQ PO TBCR
40.0000 meq | EXTENDED_RELEASE_TABLET | Freq: Once | ORAL | Status: AC
  Administered 2024-11-02: 40 meq via ORAL
  Filled 2024-11-02: qty 2

## 2024-11-02 MED ORDER — POTASSIUM CHLORIDE 10 MEQ/100ML IV SOLN
10.0000 meq | INTRAVENOUS | Status: AC
  Administered 2024-11-02 (×3): 10 meq via INTRAVENOUS
  Filled 2024-11-02 (×3): qty 100

## 2024-11-02 MED ORDER — ONDANSETRON HCL 4 MG/2ML IJ SOLN
4.0000 mg | Freq: Once | INTRAMUSCULAR | Status: AC
  Administered 2024-11-02: 4 mg via INTRAVENOUS
  Filled 2024-11-02: qty 2

## 2024-11-02 NOTE — Discharge Instructions (Addendum)
 Follow-up with GI for recheck and discuss possible other medications for treatment of your symptoms.  Take the potassium as directed try taking the Reglan .  Return if symptoms worsen or change

## 2024-11-02 NOTE — ED Provider Notes (Cosign Needed)
 " [facility] Provider Note   CSN: [id number] Arrival date & time: 11/02/24  1702     Patient presents with: No chief complaint on file.   Julie Ward is a 28 y.o. female.   Patient complains of nausea and vomiting for several weeks.  Patient reports she was diagnosed with colitis 2 weeks ago.  Patient reports that she has been seen by Dr. Avram.  Patient is currently on budesonide .  Patient reports she is having trouble keeping this down.  Patient has been taking Koomson amine and Zofran .  Patient was given a prescription for Reglan  but she has not began taking it because she was concerned about possible side effects.  Patient complains of muscle cramps.  Patient states on her arrival that her hands feel like they are locked up.  Patient complains of an elevated heart rate and feeling short of breath.  Patient is concerned that she is having a stroke.  The history is provided by the patient. No language interpreter was used.       Prior to Admission medications  Medication Sig Start Date End Date Taking? Authorizing Provider  metoCLOPramide  (REGLAN ) 10 MG tablet Take 1 tablet (10 mg total) by mouth 3 (three) times daily before meals. 11/01/24  Yes Avram Lupita BRAVO, MD  ondansetron  (ZOFRAN -ODT) 4 MG disintegrating tablet Take 1 tablet (4 mg total) by mouth 3 (three) times daily before meals. 10/29/24  Yes Avram Lupita BRAVO, MD  budesonide  (ENTOCORT EC ) 3 MG 24 hr capsule Take 3 capsules (9 mg total) by mouth daily. 10/24/24   Avram Lupita BRAVO, MD  cetirizine (ZYRTEC ALLERGY) 10 MG tablet Take 1 tablet (10 mg total) by mouth daily. 11/01/24   Avram Lupita BRAVO, MD  dicyclomine  (BENTYL ) 20 MG tablet Take 1 tablet (20 mg total) by mouth 2 (two) times daily as needed for up to 20 days for spasms. Patient not taking: Reported on 11/01/2024 09/20/24 10/10/24  Cottie Donnice PARAS, MD  diphenoxylate -atropine  (LOMOTIL ) 2.5-0.025 MG tablet Take 2 tablets  by mouth 4 (four) times daily as needed for diarrhea or loose stools. Patient not taking: Reported on 11/01/2024 09/18/24   Johnny Garnette LABOR, MD  hyoscyamine  (LEVSIN  SL) 0.125 MG SL tablet Place 1 tablet (0.125 mg total) under the tongue 3 (three) times daily before meals. 10/05/24   Avram Lupita BRAVO, MD  mirtazapine  (REMERON ) 7.5 MG tablet Take 1 tablet (7.5 mg total) by mouth at bedtime. 11/01/24   Avram Lupita BRAVO, MD  potassium chloride  SA (KLOR-CON  M) 20 MEQ tablet Take 1 tablet (20 mEq total) by mouth 2 (two) times daily. 11/01/24   Avram Lupita BRAVO, MD    Allergies: Penicillins, Prednisone, Fentanyl , Hydrocodone , Morphine  and codeine, Oxycontin  [oxycodone  hcl], and Valium  [diazepam ]    Review of Systems  Constitutional:  Negative for fever.  Gastrointestinal:  Positive for vomiting.  Neurological:  Negative for light-headedness.  All other systems reviewed and are negative.   Updated Vital Signs BP 128/83   Pulse 80   Temp 98.6 F (37 C) (Oral)   Resp 13   Ht 5' 7 (1.702 m)   Wt 91.2 kg   LMP 11/01/2024 (Exact Date)   SpO2 100%   BMI 31.48 kg/m   Physical Exam Vitals and nursing note reviewed.  Constitutional:      Appearance: She is well-developed.     Comments: Anxious appearing  HENT:     Head: Normocephalic.  Right Ear: External ear normal.     Left Ear: External ear normal.     Mouth/Throat:     Comments: Lips are dry Eyes:     Pupils: Pupils are equal, round, and reactive to light.  Pulmonary:     Effort: Pulmonary effort is normal.  Abdominal:     General: There is no distension.     Hernia: No hernia is present.  Musculoskeletal:        General: Normal range of motion.     Cervical back: Normal range of motion.     Comments: Patient has hands clenched tightly and fist.  Patient complains that her hands are numb.  Skin:    General: Skin is warm.  Neurological:     General: No focal deficit present.     Mental Status: She is alert and oriented to  person, place, and time.  Psychiatric:     Comments: Anxious hyperventilating     (all labs ordered are listed, but only abnormal results are displayed) Labs Reviewed  CBC WITH DIFFERENTIAL/PLATELET - Abnormal; Notable for the following components:      Result Value   Monocytes Absolute 1.3 (*)    All other components within normal limits  COMPREHENSIVE METABOLIC PANEL WITH GFR - Abnormal; Notable for the following components:   Sodium 132 (*)    Potassium 2.8 (*)    Chloride 85 (*)    CO2 21 (*)    Glucose, Bld 148 (*)    BUN <5 (*)    Total Protein 9.1 (*)    AST 44 (*)    ALT 58 (*)    Anion gap 27 (*)    All other components within normal limits  MAGNESIUM  - Abnormal; Notable for the following components:   Magnesium  1.6 (*)    All other components within normal limits    EKG: EKG Interpretation Date/Time:  Friday November 02 2024 18:08:42 EST Ventricular Rate:  116 PR Interval:  142 QRS Duration:  80 QT Interval:  360 QTC Calculation: 501 R Axis:   73  Text Interpretation: Sinus tachycardia Repol abnrm suggests ischemia, anterolateral Prolonged QT interval Confirmed by Yolande Charleston [phone]) on 11/02/2024 6:13:29 PM  Radiology: No results found.   .Critical Care  Performed by: Flint Sonny POUR, PA-C Authorized by: Flint Sonny POUR, PA-C   Critical care provider statement:    Critical care time (minutes):  30   Critical care start time:  11/02/2024 6:00 PM   Critical care end time:  11/02/2024 11:31 PM   Critical care time was exclusive of:  Separately billable procedures and treating other patients and teaching time   Critical care was necessary to treat or prevent imminent or life-threatening deterioration of the following conditions:  Dehydration and metabolic crisis   Critical care was time spent personally by me on the following activities:  Development of treatment plan with patient or surrogate, discussions with consultants, evaluation of patient's  response to treatment, examination of patient, ordering and review of laboratory studies, ordering and review of radiographic studies, ordering and performing treatments and interventions, pulse oximetry, re-evaluation of patient's condition, review of old charts, interpretation of cardiac output measurements and obtaining history from patient or surrogate   I assumed direction of critical care for this patient from another provider in my specialty: no      Medications Ordered in the ED  lactated ringers  bolus 1,000 mL (0 mLs Intravenous Stopped 11/02/24 2001)  ondansetron  (ZOFRAN ) injection 4 mg (  4 mg Intravenous Given 11/02/24 1900)  magnesium  sulfate IVPB 2 g 50 mL (0 g Intravenous Stopped 11/02/24 2056)  potassium chloride  10 mEq in 100 mL IVPB (0 mEq Intravenous Stopped 11/02/24 2306)  potassium chloride  SA (KLOR-CON  M) CR tablet 40 mEq (40 mEq Oral Given 11/02/24 1949)  lactated ringers  bolus 1,000 mL (0 mLs Intravenous Stopped 11/02/24 2317)                                    Medical Decision Making Patient complains of nausea and vomiting.  Patient reports that she cannot move her hands.  Patient's hands are in a fist and she states they feel numb.  Patient is breathing at approximately 60 respirations a minute.  Patient was recently told that her potassium was low.  She was given a prescription for oral potassium  Amount and/or Complexity of Data Reviewed External Data Reviewed: notes.    Details: GI notes reviewed patient has had an endoscopy colonoscopy and recent CT scan. Labs: ordered. Decision-making details documented in ED Course.    Details: Labs ordered reviewed and interpreted potassium is 2.8.  Magnesium  is 1.6 ECG/medicine tests: ordered and independent interpretation performed. Decision-making details documented in ED Course.    Details: EKG shows sinus tachycardia at 116  Risk Prescription drug management. Risk Details: Patient is instructed on breathing and  counting to 5 before exhaling.  Patient's heart rate decreased significantly and respirations improved.  Patient reports feeling better.  Patient counseled on the hyperventilation.  Patient is given IV Zofran  she is given potassium x 3 runs.  Patient is given 40 mEq of potassium.  Patient is given IV fluids x 2 L.  Patient is reevaluated she reports feeling much better she is able to take small sips without vomiting.  Patient is advised to try the Reglan  that she has been prescribed to stop her vomiting she is advised to take the potassium supplement that has been prescribed.  She is advised to discuss other possible medications with her GI doctor.  Patient is discharged in stable condition she is advised to return if symptoms worsen or change        Final diagnoses:  Vomiting without nausea, unspecified vomiting type  Hypokalemia  Hypomagnesemia  Hyperventilation    ED Discharge Orders     None       An After Visit Summary was printed and given to the patient.    Flint Sonny POUR, PA-C 11/02/24 2332  "

## 2024-11-02 NOTE — ED Triage Notes (Signed)
 Pt recenlty diagnosed with Colitis 2 weeks ago. Pt states that she isn't able keep foods fluids down. Concerned for dehydration.

## 2024-11-03 ENCOUNTER — Other Ambulatory Visit: Payer: Self-pay | Admitting: Family Medicine

## 2024-11-05 ENCOUNTER — Ambulatory Visit: Payer: Self-pay | Admitting: Internal Medicine

## 2024-11-05 ENCOUNTER — Other Ambulatory Visit: Payer: Self-pay

## 2024-11-05 ENCOUNTER — Telehealth: Payer: Self-pay | Admitting: Internal Medicine

## 2024-11-05 ENCOUNTER — Ambulatory Visit (HOSPITAL_BASED_OUTPATIENT_CLINIC_OR_DEPARTMENT_OTHER)
Admission: RE | Admit: 2024-11-05 | Discharge: 2024-11-05 | Disposition: A | Discharge status: Home / Self Care | Source: Ambulatory Visit | Attending: Internal Medicine | Admitting: Internal Medicine

## 2024-11-05 DIAGNOSIS — G932 Benign intracranial hypertension: Secondary | ICD-10-CM

## 2024-11-05 DIAGNOSIS — R112 Nausea with vomiting, unspecified: Secondary | ICD-10-CM | POA: Insufficient documentation

## 2024-11-05 DIAGNOSIS — R1110 Vomiting, unspecified: Secondary | ICD-10-CM | POA: Diagnosis not present

## 2024-11-05 DIAGNOSIS — R1115 Cyclical vomiting syndrome unrelated to migraine: Secondary | ICD-10-CM

## 2024-11-05 NOTE — Telephone Encounter (Signed)
 He has a history of idiopathic intracranial hypertension which has improved but I wonder if it is not bothering her again.  It could make her vomit.  She needs a CT of the brain without contrast  Reasons:  Vomiting History of intracranial hypertension  We should try to get this done today.  I know she has been to the ER a lot it is reasonable to go back there and to try to get it done.  We have been focusing on gastrointestinal issues but I am starting to think that is not causing her vomiting.   If she goes to the ER I need to know where so I can communicate with the emergency department physician so this gets done.   Encounter Diagnoses  Name Primary?   Persistent vomiting Yes   Idiopathic intracranial hypertension

## 2024-11-05 NOTE — Telephone Encounter (Signed)
 Still vomiting. She tells me she is taking Reglan  now. I take the Zofran  more probably because it seems to agree with my stomach better. Last vomited this morning after taking budesonide . She feels the budesonide  does exacerbate her vomiting.

## 2024-11-05 NOTE — Telephone Encounter (Signed)
 Spoke to patient.  Head CT is negative.  She is still vomiting and regurgitating.  She does not tend to be nauseous she says.  She vomits after she takes budesonide  and also regurgitates or vomits metoclopramide .  She is constipated she has not been moving her bowels.  P.o. intake is limited.  If she moves she will burp and then regurgitate fluid.  She has not taken mirtazapine  and does not want to.  She is not using dicyclomine  or Lomotil  currently.   No headaches to speak of, mild maybe.  She is not getting potassium in due to regurgitation or vomiting.   Since she believes budesonide  makes her vomit I told her to hold it.  She will also hold the Reglan .  Continue ondansetron .  Frequent small volume liquid meals   Try to dissolve potassium in water and swallow that   Note she is still spitting constantly which is not a gastrointestinal issue that I can tell.  I did not ask her tonight if she was still taking Zyrtec that I recommended due to postnasal drip.  She is to follow-up with me in the next couple of days by phone or MyChart.  I think she is at significant risk of needing admission due to significant dehydration and metabolic derangement and inability to maintain p.o. intake adequately.

## 2024-11-05 NOTE — Telephone Encounter (Signed)
 Spoke w pt about budesonide  . Pt stated she is taking the steroid and nausea medication yet still vomiting. Stated she can't keep any medication down. Pt requesting advice. Please advise. Thank you.

## 2024-11-05 NOTE — Telephone Encounter (Signed)
 Spoke with the patient. She is agreeable to the plan of care. Order placed for CT brain w/o contrast.

## 2024-11-07 ENCOUNTER — Telehealth: Payer: Self-pay | Admitting: Internal Medicine

## 2024-11-07 NOTE — Telephone Encounter (Signed)
 Spoke to physician for peer to peer CT payment is still denied.  They say MRI is the preferred test.  We have the option to appeal.

## 2024-11-07 NOTE — Telephone Encounter (Signed)
 Call reference # [id number]  Ct not approved. MRI is the preferred imaging test for the diagnosis submitted.   Peer to peer is still an option  CB  [phone]  Opt 1 Opt 8 Enter tracking #:  [id number]  CT imaging report faxed to [phone] for review in case.

## 2024-11-07 NOTE — Telephone Encounter (Signed)
 Hello, this pt's CT scan authorization needs a peer to peer discussion before approval of 12/22. I was informed any provider or clinical staff can call to provide the info.  Case# [id number] CB  [phone]  Opt 1 Opt 8 Enter tracking #:

## 2024-11-09 ENCOUNTER — Inpatient Hospital Stay (HOSPITAL_COMMUNITY)

## 2024-11-09 ENCOUNTER — Telehealth: Payer: Self-pay | Admitting: Internal Medicine

## 2024-11-09 ENCOUNTER — Encounter (HOSPITAL_COMMUNITY): Payer: Self-pay | Admitting: *Deleted

## 2024-11-09 ENCOUNTER — Other Ambulatory Visit: Payer: Self-pay

## 2024-11-09 ENCOUNTER — Inpatient Hospital Stay (HOSPITAL_COMMUNITY)
Admission: EM | Admit: 2024-11-09 | Discharge: 2024-11-16 | DRG: 641 | Disposition: A | Discharge status: Home Health | Source: Ambulatory Visit | Attending: Internal Medicine | Admitting: Internal Medicine

## 2024-11-09 DIAGNOSIS — R112 Nausea with vomiting, unspecified: Secondary | ICD-10-CM | POA: Diagnosis not present

## 2024-11-09 DIAGNOSIS — K219 Gastro-esophageal reflux disease without esophagitis: Secondary | ICD-10-CM | POA: Diagnosis present

## 2024-11-09 DIAGNOSIS — R1115 Cyclical vomiting syndrome unrelated to migraine: Secondary | ICD-10-CM | POA: Diagnosis not present

## 2024-11-09 DIAGNOSIS — K760 Fatty (change of) liver, not elsewhere classified: Secondary | ICD-10-CM | POA: Diagnosis present

## 2024-11-09 DIAGNOSIS — Z885 Allergy status to narcotic agent status: Secondary | ICD-10-CM

## 2024-11-09 DIAGNOSIS — Z8249 Family history of ischemic heart disease and other diseases of the circulatory system: Secondary | ICD-10-CM

## 2024-11-09 DIAGNOSIS — E049 Nontoxic goiter, unspecified: Secondary | ICD-10-CM | POA: Diagnosis not present

## 2024-11-09 DIAGNOSIS — D649 Anemia, unspecified: Secondary | ICD-10-CM | POA: Diagnosis present

## 2024-11-09 DIAGNOSIS — E66811 Obesity, class 1: Secondary | ICD-10-CM | POA: Diagnosis present

## 2024-11-09 DIAGNOSIS — G932 Benign intracranial hypertension: Secondary | ICD-10-CM | POA: Diagnosis present

## 2024-11-09 DIAGNOSIS — K5090 Crohn's disease, unspecified, without complications: Secondary | ICD-10-CM | POA: Diagnosis present

## 2024-11-09 DIAGNOSIS — Z9089 Acquired absence of other organs: Secondary | ICD-10-CM

## 2024-11-09 DIAGNOSIS — R634 Abnormal weight loss: Secondary | ICD-10-CM | POA: Diagnosis not present

## 2024-11-09 DIAGNOSIS — Z79899 Other long term (current) drug therapy: Secondary | ICD-10-CM

## 2024-11-09 DIAGNOSIS — R202 Paresthesia of skin: Secondary | ICD-10-CM | POA: Diagnosis not present

## 2024-11-09 DIAGNOSIS — Z880 Allergy status to penicillin: Secondary | ICD-10-CM

## 2024-11-09 DIAGNOSIS — E876 Hypokalemia: Secondary | ICD-10-CM | POA: Diagnosis not present

## 2024-11-09 DIAGNOSIS — R1111 Vomiting without nausea: Secondary | ICD-10-CM | POA: Diagnosis not present

## 2024-11-09 DIAGNOSIS — K5010 Crohn's disease of large intestine without complications: Secondary | ICD-10-CM | POA: Diagnosis not present

## 2024-11-09 DIAGNOSIS — E890 Postprocedural hypothyroidism: Secondary | ICD-10-CM | POA: Diagnosis present

## 2024-11-09 DIAGNOSIS — E559 Vitamin D deficiency, unspecified: Secondary | ICD-10-CM | POA: Diagnosis present

## 2024-11-09 DIAGNOSIS — E860 Dehydration: Secondary | ICD-10-CM | POA: Diagnosis present

## 2024-11-09 DIAGNOSIS — R0982 Postnasal drip: Secondary | ICD-10-CM | POA: Diagnosis present

## 2024-11-09 DIAGNOSIS — Z6831 Body mass index (BMI) 31.0-31.9, adult: Secondary | ICD-10-CM

## 2024-11-09 DIAGNOSIS — K5080 Crohn's disease of both small and large intestine without complications: Secondary | ICD-10-CM | POA: Diagnosis present

## 2024-11-09 DIAGNOSIS — R1110 Vomiting, unspecified: Secondary | ICD-10-CM

## 2024-11-09 DIAGNOSIS — K50919 Crohn's disease, unspecified, with unspecified complications: Secondary | ICD-10-CM | POA: Diagnosis not present

## 2024-11-09 DIAGNOSIS — G9589 Other specified diseases of spinal cord: Secondary | ICD-10-CM | POA: Diagnosis not present

## 2024-11-09 DIAGNOSIS — Z9049 Acquired absence of other specified parts of digestive tract: Secondary | ICD-10-CM

## 2024-11-09 DIAGNOSIS — K50119 Crohn's disease of large intestine with unspecified complications: Secondary | ICD-10-CM | POA: Diagnosis not present

## 2024-11-09 DIAGNOSIS — G4700 Insomnia, unspecified: Secondary | ICD-10-CM | POA: Diagnosis present

## 2024-11-09 DIAGNOSIS — F419 Anxiety disorder, unspecified: Secondary | ICD-10-CM | POA: Diagnosis present

## 2024-11-09 DIAGNOSIS — Z888 Allergy status to other drugs, medicaments and biological substances status: Secondary | ICD-10-CM

## 2024-11-09 DIAGNOSIS — K529 Noninfective gastroenteritis and colitis, unspecified: Secondary | ICD-10-CM | POA: Diagnosis not present

## 2024-11-09 LAB — COMPREHENSIVE METABOLIC PANEL WITH GFR
ALT: 49 U/L — ABNORMAL HIGH (ref 0–44)
AST: 37 U/L (ref 15–41)
Albumin: 4.5 g/dL (ref 3.5–5.0)
Alkaline Phosphatase: 74 U/L (ref 38–126)
Anion gap: 18 — ABNORMAL HIGH (ref 5–15)
BUN: <5 mg/dL — ABNORMAL LOW (ref 6–20)
CO2: 28 mmol/L (ref 22–32)
Calcium: 10 mg/dL (ref 8.9–10.3)
Chloride: 92 mmol/L — ABNORMAL LOW (ref 98–111)
Creatinine, Ser: 0.71 mg/dL (ref 0.44–1.00)
GFR, Estimated: >60 mL/min
Glucose, Bld: 112 mg/dL — ABNORMAL HIGH (ref 70–99)
Potassium: 2.8 mmol/L — ABNORMAL LOW (ref 3.5–5.1)
Sodium: 138 mmol/L (ref 135–145)
Total Bilirubin: 1 mg/dL (ref 0.0–1.2)
Total Protein: 9.6 g/dL — ABNORMAL HIGH (ref 6.5–8.1)

## 2024-11-09 LAB — URINALYSIS, ROUTINE W REFLEX MICROSCOPIC
Glucose, UA: NEGATIVE mg/dL
Hgb urine dipstick: NEGATIVE
Ketones, ur: 20 mg/dL — AB
Leukocytes,Ua: NEGATIVE
Nitrite: NEGATIVE
Protein, ur: 100 mg/dL — AB
Specific Gravity, Urine: 1.024 (ref 1.005–1.030)
pH: 5 (ref 5.0–8.0)

## 2024-11-09 LAB — HCG, SERUM, QUALITATIVE: Preg, Serum: NEGATIVE

## 2024-11-09 LAB — LIPASE, BLOOD: Lipase: 55 U/L — ABNORMAL HIGH (ref 11–51)

## 2024-11-09 LAB — CBC
HCT: 43 % (ref 36.0–46.0)
Hemoglobin: 14.8 g/dL (ref 12.0–15.0)
MCH: 29.8 pg (ref 26.0–34.0)
MCHC: 34.4 g/dL (ref 30.0–36.0)
MCV: 86.7 fL (ref 80.0–100.0)
Platelets: 383 K/uL (ref 150–400)
RBC: 4.96 MIL/uL (ref 3.87–5.11)
RDW: 13 % (ref 11.5–15.5)
WBC: 5.2 K/uL (ref 4.0–10.5)
nRBC: 0 % (ref 0.0–0.2)

## 2024-11-09 LAB — MAGNESIUM: Magnesium: 1.9 mg/dL (ref 1.7–2.4)

## 2024-11-09 MED ORDER — POTASSIUM CHLORIDE 10 MEQ/100ML IV SOLN
10.0000 meq | INTRAVENOUS | Status: AC
  Administered 2024-11-09 (×3): 10 meq via INTRAVENOUS
  Filled 2024-11-09 (×3): qty 100

## 2024-11-09 MED ORDER — IOHEXOL 300 MG/ML  SOLN: 100.0000 mL | Freq: Once | INTRAMUSCULAR | Status: DC | PRN

## 2024-11-09 MED ORDER — POTASSIUM CHLORIDE 10 MEQ/100ML IV SOLN: 10.0000 meq | INTRAVENOUS | Status: DC

## 2024-11-09 MED ORDER — IOHEXOL 300 MG/ML  SOLN
100.0000 mL | Freq: Once | INTRAMUSCULAR | Status: AC | PRN
  Administered 2024-11-09: 100 mL via INTRAVENOUS

## 2024-11-09 MED ORDER — LACTATED RINGERS IV SOLN: INTRAVENOUS | Status: DC

## 2024-11-09 MED ORDER — PSEUDOEPHEDRINE HCL ER 120 MG PO TB12
120.0000 mg | ORAL_TABLET | Freq: Once | ORAL | Status: AC
  Administered 2024-11-09: 120 mg via ORAL
  Filled 2024-11-09: qty 1

## 2024-11-09 MED ORDER — ACETAMINOPHEN 325 MG PO TABS
650.0000 mg | ORAL_TABLET | Freq: Four times a day (QID) | ORAL | Status: DC | PRN
  Administered 2024-11-13: 650 mg via ORAL

## 2024-11-09 MED ORDER — ACETAMINOPHEN 650 MG RE SUPP: 650.0000 mg | Freq: Four times a day (QID) | RECTAL | Status: DC | PRN

## 2024-11-09 MED ORDER — PSEUDOEPHEDRINE HCL ER 120 MG PO TB12
120.0000 mg | ORAL_TABLET | Freq: Two times a day (BID) | ORAL | Status: AC
  Administered 2024-11-10 – 2024-11-12 (×6): 120 mg via ORAL
  Filled 2024-11-09 (×4): qty 1

## 2024-11-09 MED ORDER — LORATADINE 10 MG PO TABS
10.0000 mg | ORAL_TABLET | Freq: Every day | ORAL | Status: DC
  Administered 2024-11-09 – 2024-11-16 (×8): 10 mg via ORAL
  Filled 2024-11-09 (×3): qty 1

## 2024-11-09 MED ORDER — ONDANSETRON HCL 4 MG/2ML IJ SOLN
4.0000 mg | Freq: Once | INTRAMUSCULAR | Status: AC
  Administered 2024-11-09: 4 mg via INTRAVENOUS
  Filled 2024-11-09: qty 2

## 2024-11-09 MED ORDER — BISACODYL 5 MG PO TBEC
5.0000 mg | DELAYED_RELEASE_TABLET | Freq: Every day | ORAL | Status: DC | PRN
  Administered 2024-11-14: 5 mg via ORAL

## 2024-11-09 MED ORDER — TRIPROLIDINE-PSE 2.5-60 MG PO TABS: 1.0000 | ORAL_TABLET | Freq: Once | ORAL | Status: DC

## 2024-11-09 NOTE — Telephone Encounter (Signed)
 Pt called stating Dr. Avram recently changed her medication and it is not working.  Pt states she is only taking Zofran  at the moment. Pt requesting call to discuss further. Please advise thank you.

## 2024-11-09 NOTE — ED Provider Notes (Signed)
 " [place] [facility] Provider Note   CSN: [id number] Arrival date & time: 11/09/24  1337     Patient presents with: Abdominal Pain and Emesis   Julie Ward is a 28 y.o. female who was referred to the ED by  GI, Dr. Avram.  There is recommending hospitalist admission with GI consultation secondary to likely Crohn's flare.  Discussing current symptoms with the patient, she states that she has had persistent vomiting with only moderate control with Zofran , has lost 40+ pounds over the last several months secondary to poor p.o. intake.  Also has generalized abdominal pain secondary to her Crohn's.      Abdominal Pain Associated symptoms: vomiting   Emesis Associated symptoms: abdominal pain        Prior to Admission medications  Medication Sig Start Date End Date Taking? Authorizing Provider  budesonide  (ENTOCORT EC ) 3 MG 24 hr capsule Take 3 capsules (9 mg total) by mouth daily. 10/24/24   Avram Lupita BRAVO, MD  cetirizine (ZYRTEC ALLERGY) 10 MG tablet Take 1 tablet (10 mg total) by mouth daily. 11/01/24   Avram Lupita BRAVO, MD  dicyclomine  (BENTYL ) 20 MG tablet Take 1 tablet (20 mg total) by mouth 2 (two) times daily as needed for up to 20 days for spasms. Patient not taking: Reported on 11/01/2024 09/20/24 10/10/24  Cottie Donnice PARAS, MD  diphenoxylate -atropine  (LOMOTIL ) 2.5-0.025 MG tablet Take 2 tablets by mouth 4 (four) times daily as needed for diarrhea or loose stools. Patient not taking: Reported on 11/01/2024 09/18/24   Johnny Garnette LABOR, MD  hyoscyamine  (LEVSIN  SL) 0.125 MG SL tablet Place 1 tablet (0.125 mg total) under the tongue 3 (three) times daily before meals. 10/05/24   Avram Lupita BRAVO, MD  metoCLOPramide  (REGLAN ) 10 MG tablet Take 1 tablet (10 mg total) by mouth 3 (three) times daily before meals. 11/01/24   Avram Lupita BRAVO, MD  ondansetron  (ZOFRAN -ODT) 4 MG disintegrating tablet Take 1 tablet (4 mg total) by mouth 3 (three)  times daily before meals. 10/29/24   Avram Lupita BRAVO, MD  potassium chloride  SA (KLOR-CON  M) 20 MEQ tablet Take 1 tablet (20 mEq total) by mouth 2 (two) times daily. 11/01/24   Avram Lupita BRAVO, MD    Allergies: Penicillins, Prednisone, Fentanyl , Hydrocodone , Morphine  and codeine, Oxycontin  [oxycodone  hcl], and Valium  [diazepam ]    Review of Systems  Gastrointestinal:  Positive for abdominal pain and vomiting.  All other systems reviewed and are negative.   Updated Vital Signs BP (!) 142/93   Pulse 97   Temp 98.8 F (37.1 C)   Resp 20   Ht 5' 7 (1.702 m)   Wt 89.8 kg   LMP 11/01/2024 (Exact Date)   SpO2 100%   BMI 31.01 kg/m   Physical Exam Vitals and nursing note reviewed.  Constitutional:      General: She is not in acute distress.    Appearance: Normal appearance. She is well-developed.  HENT:     Head: Normocephalic and atraumatic.     Mouth/Throat:     Mouth: Mucous membranes are moist.     Pharynx: Oropharynx is clear.  Eyes:     Extraocular Movements: Extraocular movements intact.     Conjunctiva/sclera: Conjunctivae normal.     Pupils: Pupils are equal, round, and reactive to light.  Cardiovascular:     Rate and Rhythm: Normal rate and regular rhythm.     Pulses: Normal pulses.     Heart sounds:  Normal heart sounds. No murmur heard.    No friction rub. No gallop.  Pulmonary:     Effort: Pulmonary effort is normal.     Breath sounds: Normal breath sounds.  Abdominal:     General: Abdomen is flat. Bowel sounds are normal.     Palpations: Abdomen is soft.     Tenderness: There is generalized abdominal tenderness.  Musculoskeletal:        General: Normal range of motion.     Cervical back: Normal range of motion and neck supple.     Right lower leg: No edema.     Left lower leg: No edema.  Skin:    General: Skin is warm and dry.     Capillary Refill: Capillary refill takes less than 2 seconds.  Neurological:     General: No focal deficit present.      Mental Status: She is alert. Mental status is at baseline.  Psychiatric:        Mood and Affect: Mood normal.     (all labs ordered are listed, but only abnormal results are displayed) Labs Reviewed  LIPASE, BLOOD - Abnormal; Notable for the following components:      Result Value   Lipase 55 (*)    All other components within normal limits  COMPREHENSIVE METABOLIC PANEL WITH GFR - Abnormal; Notable for the following components:   Potassium 2.8 (*)    Chloride 92 (*)    Glucose, Bld 112 (*)    BUN <5 (*)    Total Protein 9.6 (*)    ALT 49 (*)    Anion gap 18 (*)    All other components within normal limits  URINALYSIS, ROUTINE W REFLEX MICROSCOPIC - Abnormal; Notable for the following components:   Color, Urine AMBER (*)    APPearance HAZY (*)    Bilirubin Urine SMALL (*)    Ketones, ur 20 (*)    Protein, ur 100 (*)    Bacteria, UA RARE (*)    All other components within normal limits  CBC  HCG, SERUM, QUALITATIVE  MAGNESIUM     EKG: None  Radiology: No results found.   Procedures   Medications Ordered in the ED  potassium chloride  10 mEq in 100 mL IVPB (has no administration in time range)                                    Medical Decision Making Amount and/or Complexity of Data Reviewed Labs: ordered. Radiology: ordered.   Given this patient's previous history of chron's disease with acute colitis, lack of oral intake and weight loss, along with referral from [facility], Dr. Avram, findings consistent with a likely Crohn's flare, will initiate workup to include CMP, CBC, serum magnesium , serum lipase, hCG, and UA.  Will also obtain CT imaging of the abdomen and pelvis to evaluate for current and new abdominal pathology.  On review of the CMP she has a potassium of 2.8, will provide IV repletion since she has poor p.o. intake and poor p.o. tolerance.  I have ordered potassium 10 mill EQ IV x 4.  Consulted with Dr. Arthea with the hospitalist service  who will except this patient for admission with GI follow-up.  This was discussed with the patient which verbalizes agreement and understanding of need for admission at this time.  Magnesium  is pending at time of discharge, as is the CT scan.  Final diagnoses:  Crohn's disease with complication, unspecified gastrointestinal tract location [facility])    ED Discharge Orders     None          Myriam Dorn BROCKS, [place] 11/09/24 1646    Randol Simmonds, MD 11/10/24 [phone]  "

## 2024-11-09 NOTE — ED Provider Notes (Signed)
 Dr. Avram of [facility] sent patient in for continued vomiting and possible Crohn's flare  He recommended hospitalist admission and will consult on her.  He mentioned that he does not have specific opinions on whether or not she needs a CT scan.   Neldon Hamp RAMAN, [place] 11/09/24 1450    Ellouise Richerd POUR, DO 11/09/24 1511

## 2024-11-09 NOTE — Telephone Encounter (Signed)
 Pt notified via mychart

## 2024-11-09 NOTE — H&P (Addendum)
 " History and Physical    Patient: Julie Ward FMW:[id number] DOB: 1996-10-26 DOA: 11/09/2024 DOS: the patient was seen and examined on 11/09/2024 PCP: Johnny Garnette LABOR, MD  Patient coming from: Home  Chief Complaint:  Chief Complaint  Patient presents with   Abdominal Pain   Emesis   HPI: Julie Ward is a 28 y.o. female with medical history significant for Crohn's disease.  The patient was in her usual state of health until couple of months ago when she developed multiple episodes of vomiting daily.  This is not preceded with nausea she will just be laying in her bed and vomit.  She will get a couple minutes of a warning with acid reflux and brash.  She often throws up about 30 to 40 minutes after drinking or eating.  She follows closely with her gastroenterologist.  Several antiemetics have been tried but she says Zofran  works best for her.  Currently she takes 4 mg of Zofran  30 minutes before every meal 3 times daily.  She is still throwing up on average about 3 times a day but this is much less then she was throwing up before she started the Zofran .  Reglan  did not help at all and many of the other antiemetics have given her side effects.  She has low-level chronic abdominal pain which is unchanged.  She has not had any fevers or chills.  The patient had a colonoscopy 2 weeks ago because of the symptoms.  She had to have the bowel prep prior to the colonoscopy which worked.  After that she did not have any bowel movements for about a week and a half but she did have a normal, formed bowel movement yesterday. She was having diarrhea prior to the colonoscopy but not since.  The patient tells me that her colonoscopy revealed some colitis and she was put on budesonide  which she has taken in the past.  No antibiotics.   No fevers. She had an endoscopy a few weeks prior to the colonoscopy which was normal she tells me. She has lost approximately 30 pounds in the last 2 months because of all this  vomiting and not being able to eat.  Her gastroenterologist suggested that she come to the ER today when she called reporting her continued symptoms. The patient says she was fine but has become very stressed out and anxious because of all this vomiting with no improvement over several months. In the emergency department the patient was found to have a potassium of 2.8.  Her white blood cell count is normal at 5.  Her magnesium  level is 1.9.  The hospitalist have been called to admit the patient.  Her CT is pending at this time. I did add a CT of the chest to her abdomen and pelvis which was already ordered to rule out pneumonia or any chest pathology that might be affecting her vomiting.  She has a history of idiopathic intracranial hypertension which was discovered on eye exam 2 years ago.  This has resolved.  She has had 2 eye exams which have cleared her.  She does also follow with a neurologist.  She has taken Lasix  but this is felt to be cleared.  She did have a CT scan of her head in the emergency department which was unremarkable.   Review of Systems: As mentioned in the history of present illness. All other systems reviewed and are negative. Past Medical History:  Diagnosis Date   Abdominal pain, recurrent  Allergy    Anxiety    Appendicitis    Colitis    Crohn's disease of colon (HCC) 08/15/2013   sees Dr. Vina Rattler at [facility]    Dysmenorrhea    GERD (gastroesophageal reflux disease)    not taking any medication   IIH (idiopathic intracranial hypertension)    I have been cleared of that since July 2025   Shortness of breath dyspnea    ith thyroid  problem   Past Surgical History:  Procedure Laterality Date   APPENDECTOMY  07-14-11   laparoscopic    CHOLECYSTECTOMY  02/11/2012   Procedure: LAPAROSCOPIC CHOLECYSTECTOMY WITH INTRAOPERATIVE CHOLANGIOGRAM;  Surgeon: Lynwood MALVA Pina, MD;  Location: [facility] OR;  Service: General;  Laterality: N/A;   ESOPHAGOGASTRODUODENOSCOPY   01/28/2012   Procedure: ESOPHAGOGASTRODUODENOSCOPY (EGD);  Surgeon: Fairy VEAR Gaskins, MD;  Location: [facility] OR;  Service: Gastroenterology;  Laterality: N/A;   THYROID  LOBECTOMY Left 04/29/2015   Procedure: LEFT TOTAL THYROID  LOBECTOMY;  Surgeon: Lynwood Pina, MD;  Location: [facility] OR;  Service: General;  Laterality: Left;   WISDOM TOOTH EXTRACTION     Social History:  reports that she has never smoked. She has never used smokeless tobacco. She reports current alcohol use. She reports that she does not use drugs.  Allergies[1]  Family History  Problem Relation Age of Onset   Cancer Paternal Aunt        ovarian, kidney   Cancer Paternal Uncle        colon   Cancer Maternal Grandmother        breast    Hypertension Maternal Grandmother    Irritable bowel syndrome Maternal Grandmother    Heart disease Maternal Grandmother    Hypertension Paternal Grandmother    Hyperlipidemia Paternal Grandmother    Colon cancer Paternal Grandfather    Esophageal cancer Neg Hx    Rectal cancer Neg Hx    Stomach cancer Neg Hx     Prior to Admission medications  Medication Sig Start Date End Date Taking? Authorizing Provider  budesonide  (ENTOCORT EC ) 3 MG 24 hr capsule Take 3 capsules (9 mg total) by mouth daily. 10/24/24   Avram Lupita BRAVO, MD  cetirizine (ZYRTEC ALLERGY) 10 MG tablet Take 1 tablet (10 mg total) by mouth daily. 11/01/24   Avram Lupita BRAVO, MD  dicyclomine  (BENTYL ) 20 MG tablet Take 1 tablet (20 mg total) by mouth 2 (two) times daily as needed for up to 20 days for spasms. Patient not taking: Reported on 11/01/2024 09/20/24 10/10/24  Cottie Donnice PARAS, MD  diphenoxylate -atropine  (LOMOTIL ) 2.5-0.025 MG tablet Take 2 tablets by mouth 4 (four) times daily as needed for diarrhea or loose stools. Patient not taking: Reported on 11/01/2024 09/18/24   Johnny Garnette LABOR, MD  hyoscyamine  (LEVSIN  SL) 0.125 MG SL tablet Place 1 tablet (0.125 mg total) under the tongue 3 (three) times daily before meals. 10/05/24    Avram Lupita BRAVO, MD  metoCLOPramide  (REGLAN ) 10 MG tablet Take 1 tablet (10 mg total) by mouth 3 (three) times daily before meals. 11/01/24   Avram Lupita BRAVO, MD  ondansetron  (ZOFRAN -ODT) 4 MG disintegrating tablet Take 1 tablet (4 mg total) by mouth 3 (three) times daily before meals. 10/29/24   Avram Lupita BRAVO, MD  potassium chloride  SA (KLOR-CON  M) 20 MEQ tablet Take 1 tablet (20 mEq total) by mouth 2 (two) times daily. 11/01/24   Avram Lupita BRAVO, MD    Physical Exam: Vitals:   11/09/24 1343 11/09/24 1345 11/09/24 1406 11/09/24  1729  BP: (!) 142/93 (!) 142/93  121/78  Pulse: 98 97  94  Resp: 20 20  17   Temp: 98.8 F (37.1 C)   98.9 F (37.2 C)  TempSrc:    Oral  SpO2: 100% 100%  100%  Weight:   89.8 kg   Height:   5' 7 (1.702 m)    Physical Exam:  General: No acute distress, well developed, well nourished HEENT: Normocephalic, atraumatic, PERRL Cardiovascular: Normal rate and rhythm. Distal pulses intact. Pulmonary: Normal pulmonary effort, normal breath sounds Gastrointestinal: Nondistended abdomen, soft, tender diffusely but worst in eipgastric, periumbilical and RLQ, hypoactive bowel sounds Musculoskeletal:Normal ROM, no lower ext edema Lymphadenopathy: No cervical LAD. Skin: Skin is warm and dry. Neuro: No focal deficits noted, AAOx3. PSYCH: Attentive and cooperative  Data Reviewed:  Results for orders placed or performed during the hospital encounter of 11/09/24 (from the past 24 hours)  Lipase, blood     Status: Abnormal   Collection Time: 11/09/24  4:12 PM  Result Value Ref Range   Lipase 55 (H) 11 - 51 U/L  Comprehensive metabolic panel     Status: Abnormal   Collection Time: 11/09/24  4:12 PM  Result Value Ref Range   Sodium 138 135 - 145 mmol/L   Potassium 2.8 (L) 3.5 - 5.1 mmol/L   Chloride 92 (L) 98 - 111 mmol/L   CO2 28 22 - 32 mmol/L   Glucose, Bld 112 (H) 70 - 99 mg/dL   BUN <5 (L) 6 - 20 mg/dL   Creatinine, Ser 9.28 0.44 - 1.00 mg/dL   Calcium  89.9 8.9 - 89.6 mg/dL   Total Protein 9.6 (H) 6.5 - 8.1 g/dL   Albumin 4.5 3.5 - 5.0 g/dL   AST 37 15 - 41 U/L   ALT 49 (H) 0 - 44 U/L   Alkaline Phosphatase 74 38 - 126 U/L   Total Bilirubin 1.0 0.0 - 1.2 mg/dL   GFR, Estimated >39 >39 mL/min   Anion gap 18 (H) 5 - 15  CBC     Status: None   Collection Time: 11/09/24  4:12 PM  Result Value Ref Range   WBC 5.2 4.0 - 10.5 K/uL   RBC 4.96 3.87 - 5.11 MIL/uL   Hemoglobin 14.8 12.0 - 15.0 g/dL   HCT 56.9 63.9 - 53.9 %   MCV 86.7 80.0 - 100.0 fL   MCH 29.8 26.0 - 34.0 pg   MCHC 34.4 30.0 - 36.0 g/dL   RDW 86.9 88.4 - 84.4 %   Platelets 383 150 - 400 K/uL   nRBC 0.0 0.0 - 0.2 %  hCG, serum, qualitative     Status: None   Collection Time: 11/09/24  4:12 PM  Result Value Ref Range   Preg, Serum NEGATIVE NEGATIVE  Magnesium      Status: None   Collection Time: 11/09/24  4:12 PM  Result Value Ref Range   Magnesium  1.9 1.7 - 2.4 mg/dL  Urinalysis, Routine w reflex microscopic -Urine, Clean Catch     Status: Abnormal   Collection Time: 11/09/24  4:25 PM  Result Value Ref Range   Color, Urine AMBER (A) YELLOW   APPearance HAZY (A) CLEAR   Specific Gravity, Urine 1.024 1.005 - 1.030   pH 5.0 5.0 - 8.0   Glucose, UA NEGATIVE NEGATIVE mg/dL   Hgb urine dipstick NEGATIVE NEGATIVE   Bilirubin Urine SMALL (A) NEGATIVE   Ketones, ur 20 (A) NEGATIVE mg/dL   Protein, ur 899 (  A) NEGATIVE mg/dL   Nitrite NEGATIVE NEGATIVE   Leukocytes,Ua NEGATIVE NEGATIVE   RBC / HPF 0-5 0 - 5 RBC/hpf   WBC, UA 6-10 0 - 5 WBC/hpf   Bacteria, UA RARE (A) NONE SEEN   Squamous Epithelial / HPF 0-5 0 - 5 /HPF   Mucus PRESENT    Hyaline Casts, UA PRESENT      Assessment and Plan: Intractable vomiting 2. Hypokalemia  3. Severe post nasal drip 4. Crohn's disease/ recent colonoscopy found colitis 5. H/o intracranial hypertension - now resolved 6. H/o thyroidectomy 7. Abdominal pain - CT of chest abdomen pelvis ordered - Continue 3 times daily Zofran  30  minutes prior to meals.  The patient says Zofran  does not help at all.  Antipsychotics made her very fidgety.  Diazepam  gave her the opposite effect and made her very hyper.  She has also had trouble with Phenergan . - Check TSH - Aggressively treat her postnasal drip.  Will start scheduled Sudafed every 12 hours.  No Flonase as she is allergic to prednisone.  Add Allegra. Nasal  Rinses are not available in hospital but this can be ordered upon discharge. - Replete potassium.  Re evaluate numbness symptoms once potassium is replaced. - Monitor for any intracranial hypertension symptoms since she is going to have a course of Sudafed. - await GI recommendations     Advance Care Planning:   Code Status: Full Code  The patient will be full code by default.  CODE STATUS was not discussed.  Consults: [facility]  Family Communication: Mom at bedside  Severity of Illness: The appropriate patient status for this patient is INPATIENT. Inpatient status is judged to be reasonable and necessary in order to provide the required intensity of service to ensure the patient's safety. The patient's presenting symptoms, physical exam findings, and initial radiographic and laboratory data in the context of their chronic comorbidities is felt to place them at high risk for further clinical deterioration. Furthermore, it is not anticipated that the patient will be medically stable for discharge from the hospital within 2 midnights of admission.   * I certify that at the point of admission it is my clinical judgment that the patient will require inpatient hospital care spanning beyond 2 midnights from the point of admission due to high intensity of service, high risk for further deterioration and high frequency of surveillance required.*  Author: ARTHEA CHILD, MD 11/09/2024 6:24 PM  For on call review [email or web address].      [1]  Allergies Allergen Reactions   Penicillins Shortness Of Breath, Nausea Only  and Other (See Comments)    Made her sick/Childhood Allergy Has patient had a PCN reaction causing immediate rash, facial/tongue/throat swelling, SOB or lightheadedness with hypotension:yes Has patient had a PCN reaction causing severe rash involving mucus membranes or skin necrosis: No Has patient had a PCN reaction that required hospitalization: No Has patient had a PCN reaction occurring within the last 10 years:No .   Prednisone Hives   Fentanyl  Other (See Comments)    anxiety   Hydrocodone  Nausea And Vomiting   Morphine  And Codeine Nausea And Vomiting and Other (See Comments)    Caused stomach to burn    Oxycontin  [Oxycodone  Hcl] Nausea And Vomiting    vomiting   Valium  [Diazepam ] Other (See Comments)    Hyperactivity    "

## 2024-11-09 NOTE — Telephone Encounter (Signed)
 I had already spoken to the ER doctors while she was in the waiting room she is getting admitted like I recommended.  Thanks.

## 2024-11-09 NOTE — Telephone Encounter (Signed)
 Inbound call from patient stating she is at the ER at [facility] and is wanting to see if Dr. Avram could call the ER or come to the ER  to talk to the doctor. She states that everyone there has flu like symptoms and shed rather not risk her health further sitting there for no reason because they said they may not keep her. If that is the case I will come to our office  and get her  labs done and go back home. She states theres no need in wasting anyones time or her catching the flu sitting there when they dont plan to admit her. Patient is requesting a call back to discuss. Please advise.

## 2024-11-09 NOTE — Telephone Encounter (Signed)
 I do not have any other ideas at this time  If she is failing to stop vomiting she needs to return to the ED and likely be admitted  She should tell the ED MD to contact me

## 2024-11-09 NOTE — Progress Notes (Signed)
"      ° ° °  28 year old woman known to me from recent encounters, she has Crohn's disease previously in the ileum but now probably in the colon, though not severe, persistent nausea and vomiting with weight loss and metabolic derangements secondary to the above.  EGD colonoscopy CT scanning of the abdomen and pelvis and CT of the head have not shown an obvious cause.  There is a history of intracranial hypertension though she is not having headaches and no evidence of that on CT scan.  I asked her to come to the ED because she has persisted in calling the office due to failure to improve despite budesonide  therapy and ondansetron .  She has declined to try mirtazapine  and metoclopramide .  There is a history of delayed gastric emptying at 2 hours but normal emptying at 4 hours on a gastric emptying study years ago.   Because of her persistent nausea and vomiting her not clear.  She has also had a persistent spitting and has complained of postnasal drip issues.  Nausea and vomiting may not be a gastrointestinal problem though I am not certain at this time.  I will see her tomorrow after admission.  She obviously needs IV hydration and potassium replacement.  I appreciate the assistance of the emergency department and the [facility] hospitalist service.  I would use IV Reglan  every 6 hours if possible.  [name], MD, [facility] See TRACEY on call - gastroenterology for best contact person 11/09/2024 4:57 PM  "

## 2024-11-09 NOTE — Telephone Encounter (Signed)
 Noted

## 2024-11-09 NOTE — ED Triage Notes (Signed)
 Pt states she has been vomiting for months, spoke with her PCP who told her to come today for admission. Pt has chron's and recent colitis.

## 2024-11-10 DIAGNOSIS — K50119 Crohn's disease of large intestine with unspecified complications: Secondary | ICD-10-CM

## 2024-11-10 DIAGNOSIS — R1111 Vomiting without nausea: Secondary | ICD-10-CM | POA: Diagnosis not present

## 2024-11-10 DIAGNOSIS — R112 Nausea with vomiting, unspecified: Secondary | ICD-10-CM | POA: Diagnosis not present

## 2024-11-10 DIAGNOSIS — R1115 Cyclical vomiting syndrome unrelated to migraine: Secondary | ICD-10-CM | POA: Diagnosis not present

## 2024-11-10 DIAGNOSIS — E860 Dehydration: Secondary | ICD-10-CM | POA: Diagnosis not present

## 2024-11-10 DIAGNOSIS — K529 Noninfective gastroenteritis and colitis, unspecified: Secondary | ICD-10-CM | POA: Diagnosis not present

## 2024-11-10 DIAGNOSIS — E876 Hypokalemia: Secondary | ICD-10-CM | POA: Diagnosis not present

## 2024-11-10 LAB — COMPREHENSIVE METABOLIC PANEL WITH GFR
ALT: 31 U/L (ref 0–44)
AST: 20 U/L (ref 15–41)
Albumin: 3.3 g/dL — ABNORMAL LOW (ref 3.5–5.0)
Alkaline Phosphatase: 55 U/L (ref 38–126)
Anion gap: 13 (ref 5–15)
BUN: <5 mg/dL — ABNORMAL LOW (ref 6–20)
CO2: 26 mmol/L (ref 22–32)
Calcium: 8.8 mg/dL — ABNORMAL LOW (ref 8.9–10.3)
Chloride: 96 mmol/L — ABNORMAL LOW (ref 98–111)
Creatinine, Ser: 0.61 mg/dL (ref 0.44–1.00)
GFR, Estimated: >60 mL/min
Glucose, Bld: 117 mg/dL — ABNORMAL HIGH (ref 70–99)
Potassium: 2.6 mmol/L — CL (ref 3.5–5.1)
Sodium: 135 mmol/L (ref 135–145)
Total Bilirubin: 0.6 mg/dL (ref 0.0–1.2)
Total Protein: 6.9 g/dL (ref 6.5–8.1)

## 2024-11-10 LAB — HIV ANTIBODY (ROUTINE TESTING W REFLEX): HIV Screen 4th Generation wRfx: NONREACTIVE

## 2024-11-10 LAB — CBC
HCT: 32.8 % — ABNORMAL LOW (ref 36.0–46.0)
Hemoglobin: 11.2 g/dL — ABNORMAL LOW (ref 12.0–15.0)
MCH: 29.6 pg (ref 26.0–34.0)
MCHC: 34.1 g/dL (ref 30.0–36.0)
MCV: 86.8 fL (ref 80.0–100.0)
Platelets: 290 K/uL (ref 150–400)
RBC: 3.78 MIL/uL — ABNORMAL LOW (ref 3.87–5.11)
RDW: 12.9 % (ref 11.5–15.5)
WBC: 4.8 K/uL (ref 4.0–10.5)
nRBC: 0 % (ref 0.0–0.2)

## 2024-11-10 LAB — TSH: TSH: 3.55 u[IU]/mL (ref 0.350–4.500)

## 2024-11-10 LAB — CORTISOL-AM, BLOOD: Cortisol - AM: 6.4 ug/dL — ABNORMAL LOW (ref 6.7–22.6)

## 2024-11-10 MED ORDER — ONDANSETRON HCL 4 MG/2ML IJ SOLN
4.0000 mg | Freq: Once | INTRAMUSCULAR | Status: AC
  Administered 2024-11-10: 4 mg via INTRAVENOUS

## 2024-11-10 MED ORDER — ONDANSETRON HCL 4 MG/2ML IJ SOLN
4.0000 mg | Freq: Four times a day (QID) | INTRAMUSCULAR | Status: DC | PRN
  Administered 2024-11-11: 4 mg via INTRAVENOUS
  Filled 2024-11-10 (×2): qty 2

## 2024-11-10 MED ORDER — LACTATED RINGERS IV SOLN: INTRAVENOUS | Status: DC

## 2024-11-10 MED ORDER — METOCLOPRAMIDE HCL 5 MG/ML IJ SOLN
5.0000 mg | Freq: Four times a day (QID) | INTRAMUSCULAR | Status: DC
  Administered 2024-11-10: 5 mg via INTRAVENOUS
  Filled 2024-11-10: qty 2

## 2024-11-10 MED ORDER — POTASSIUM CHLORIDE 10 MEQ/100ML IV SOLN
10.0000 meq | INTRAVENOUS | Status: DC
  Administered 2024-11-10: 10 meq via INTRAVENOUS
  Filled 2024-11-10: qty 100

## 2024-11-10 MED ORDER — COSYNTROPIN 0.25 MG IJ SOLR
0.2500 mg | Freq: Once | INTRAMUSCULAR | Status: AC
  Administered 2024-11-11: 0.25 mg via INTRAVENOUS
  Filled 2024-11-10: qty 0.25

## 2024-11-10 MED ORDER — POTASSIUM CHLORIDE CRYS ER 20 MEQ PO TBCR
40.0000 meq | EXTENDED_RELEASE_TABLET | ORAL | Status: AC
  Administered 2024-11-10 (×3): 40 meq via ORAL
  Filled 2024-11-10 (×3): qty 2

## 2024-11-10 NOTE — Progress Notes (Signed)
 Patient was feeling hot sensation, anxious ,anxiety and fatigue after given the Iv Reglan . MD Rojelio notified and switch to the Zofran  instead of Reglan .

## 2024-11-10 NOTE — Consult Note (Addendum)
 "    Consultation  Referring Provider:      Primary Care Physician:  Johnny Garnette LABOR, MD Primary Gastroenterologist:         Reason for Consultation:         Gastroenterology summary:   Crohn's ileocolitis diagnosed October 2014.  Initial complaints were abdominal pain and nausea.  Treated with budesonide  for several years, seen at [facility] then lost to follow-up since 2018 resurfaced late 2025 [facility]   Chronic recurrent nausea and vomiting, delayed gastric emptying at 2 hours on 4-hour emptying study 82% retention at 2 hours normal at 4 hours   Suspected IBS overlap   EGD 10/05/2024-normal EGD 2015 at [facility], celiac biopsies negative for celiac disease focal nonspecific chronic duodenitis   Colonoscopy 10/18/2024 normal terminal ileum and biopsies, erythematous mucosa in the entire examined colon biopsy showed focal active colitis in the ascending and transverse, active colitis with crypt abscesses in the rectum sigmoid and transverse.  No granulomata.  No chronic changes.   MR enterography 2016 normal   Impression / Plan:   Persistent nausea and vomiting with very poor intake and weight loss over the past several weeks.  Crohn's disease with a history of Crohn's ileitis now with suspected Crohn's colitis but no chronic changes on biopsies so possibly not active Crohn's.  Diarrhea has resolved rapidly and she is constipated now though there is limited oral intake.  History of idiopathic intracranial hypertension apparently resolved.  Recent CT of the head without abnormality.  History of delayed gastric emptying at 2 hours of a 4-hour emptying scan with overall normal result at 4 hours.  Postnasal drip  Profound hypokalemia, dehydration   ---------------------------------------------------------------------------------------------------------------------------------------  Continue IV Reglan  every 6 hours and as needed ondansetron   Continue to replete  potassium  Regular diet was ordered she will try that today.  Might need to backtrack.  Continue other therapy, I will see her again tomorrow.  [name], MD, [facility] See AMION on call - gastroenterology for best contact person 11/10/2024 3:16 PM   ADDENDUM - AM CORTISOL WAS LOW - WILL DO CORTROSYN  STIM TEST (THOUGH SHE HAS USED BUDESONIDE  IT WAS NOT FOR LONG AND IT SHOULD NOT SUPPRESS CORTISOL PRODUCTION, TYPICALLY. PITUITARY WAS THINNED ON 2022 CT HEAD BUT THOUGHT POSSIBLY DUE TO INCREASED INTRACRANIAL PRESSURES, THEN        HPI:   Julie Ward is a 28 y.o. female with a history of Crohn's disease previously in the ileum and now in the colon, admitted due to persistent nausea and vomiting despite outpatient therapy, weight loss and profound hypokalemia and dehydration.  Patient has been seen in our office several times in the emergency department several times in the last 6 weeks or so.  She has a history of Crohn's ileitis.  She had an EGD which was unrevealing i.e. normal 10/05/2024.  She was vomiting so much she could not do the colonoscopy prep at that time.  Subsequent colonoscopy with normal terminal ileum including biopsies, patchy erythematous mucosa in the entire colon with biopsy showing focal active colitis but no chronic changes.  She is seen today with numerous family members in the room.  She has tried but failed hyoscyamine , dicyclomine , and was not really making it with ondansetron  as an outpatient.  She could not replete her potassium with oral supplements.  She was not eating or drinking much and continues to lose weight and felt very weak.  I had prescribed mirtazapine  due to  the nausea and terrible insomnia but she declined to take it.  She also did not take metoclopramide .  She has not vomited since yesterday.  At the longest she has been without vomiting.  She says the pseudoephedrine  and loratadine  have helped her postnasal drip quite a bit.   Denies headaches.  Says vision is off but no double vision or significant decreased acuity.  Still has lower abdominal pain.  Has not moved her bowels since admission and remains on the constipated end of the spectrum right now.  Wt Readings from Last 3 Encounters:  11/09/24 89.8 kg  11/02/24 91.2 kg  11/01/24 91.3 kg         Past Medical History:  Diagnosis Date   Abdominal pain, recurrent    Allergy    Anxiety    Appendicitis    Colitis    Crohn's disease of colon (HCC) 08/15/2013   sees Dr. Vina Rattler at [facility]    Dysmenorrhea    GERD (gastroesophageal reflux disease)    not taking any medication   IIH (idiopathic intracranial hypertension)    I have been cleared of that since July 2025   Shortness of breath dyspnea    ith thyroid  problem    Past Surgical History:  Procedure Laterality Date   APPENDECTOMY  07-14-11   laparoscopic    CHOLECYSTECTOMY  02/11/2012   Procedure: LAPAROSCOPIC CHOLECYSTECTOMY WITH INTRAOPERATIVE CHOLANGIOGRAM;  Surgeon: Lynwood MALVA Pina, MD;  Location: [facility] OR;  Service: General;  Laterality: N/A;   ESOPHAGOGASTRODUODENOSCOPY  01/28/2012   Procedure: ESOPHAGOGASTRODUODENOSCOPY (EGD);  Surgeon: Fairy VEAR Gaskins, MD;  Location: [facility] OR;  Service: Gastroenterology;  Laterality: N/A;   THYROID  LOBECTOMY Left 04/29/2015   Procedure: LEFT TOTAL THYROID  LOBECTOMY;  Surgeon: Lynwood Pina, MD;  Location: [facility] OR;  Service: General;  Laterality: Left;   WISDOM TOOTH EXTRACTION      Family History  Problem Relation Age of Onset   Cancer Paternal Aunt        ovarian, kidney   Cancer Paternal Uncle        colon   Cancer Maternal Grandmother        breast    Hypertension Maternal Grandmother    Irritable bowel syndrome Maternal Grandmother    Heart disease Maternal Grandmother    Hypertension Paternal Grandmother    Hyperlipidemia Paternal Grandmother    Colon cancer Paternal Grandfather    Esophageal cancer Neg Hx    Rectal cancer Neg Hx     Stomach cancer Neg Hx      Social History[1]  Prior to Admission medications  Medication Sig Start Date End Date Taking? Authorizing Provider  ondansetron  (ZOFRAN -ODT) 4 MG disintegrating tablet Take 1 tablet (4 mg total) by mouth 3 (three) times daily before meals. Patient taking differently: Take 4 mg by mouth See admin instructions. Dissolve 4 mg in the mouth three times a day before meals 10/29/24  Yes Avram Lupita BRAVO, MD  budesonide  (ENTOCORT EC ) 3 MG 24 hr capsule Take 3 capsules (9 mg total) by mouth daily. Patient not taking: Reported on 11/09/2024 10/24/24   Avram Lupita BRAVO, MD  cetirizine (ZYRTEC ALLERGY) 10 MG tablet Take 1 tablet (10 mg total) by mouth daily. Patient not taking: Reported on 11/09/2024 11/01/24   Avram Lupita BRAVO, MD  dicyclomine  (BENTYL ) 20 MG tablet Take 1 tablet (20 mg total) by mouth 2 (two) times daily as needed for up to 20 days for spasms. Patient not taking: Reported on 11/09/2024 09/20/24  11/09/24  Cottie Donnice PARAS, MD  diphenoxylate -atropine  (LOMOTIL ) 2.5-0.025 MG tablet Take 2 tablets by mouth 4 (four) times daily as needed for diarrhea or loose stools. Patient not taking: Reported on 11/09/2024 09/18/24   Johnny Garnette LABOR, MD  hyoscyamine  (LEVSIN  SL) 0.125 MG SL tablet Place 1 tablet (0.125 mg total) under the tongue 3 (three) times daily before meals. Patient not taking: Reported on 11/09/2024 10/05/24   Avram Lupita BRAVO, MD  metoCLOPramide  (REGLAN ) 10 MG tablet Take 1 tablet (10 mg total) by mouth 3 (three) times daily before meals. Patient not taking: Reported on 11/09/2024 11/01/24   Avram Lupita BRAVO, MD  potassium chloride  SA (KLOR-CON  M) 20 MEQ tablet Take 1 tablet (20 mEq total) by mouth 2 (two) times daily. Patient not taking: Reported on 11/09/2024 11/01/24   Avram Lupita BRAVO, MD    Current Facility-Administered Medications  Medication Dose Route Frequency Provider Last Rate Last Admin   acetaminophen  (TYLENOL ) tablet 650 mg  650 mg Oral Q6H PRN  Arthea Child, MD       Or   acetaminophen  (TYLENOL ) suppository 650 mg  650 mg Rectal Q6H PRN Arthea Child, MD       bisacodyl  (DULCOLAX) EC tablet 5 mg  5 mg Oral Daily PRN Arthea Child, MD       lactated ringers  infusion   Intravenous Continuous Rojelio Nest, DO 100 mL/hr at 11/10/24 0913 New Bag at 11/10/24 0913   loratadine  (CLARITIN ) tablet 10 mg  10 mg Oral Daily Claiborne, Claudia, MD   10 mg at 11/10/24 9087   metoCLOPramide  (REGLAN ) injection 5 mg  5 mg Intravenous Q6H Rojelio Nest, DO       potassium chloride  SA (KLOR-CON  M) CR tablet 40 mEq  40 mEq Oral Q4H Rojelio Nest, DO   40 mEq at 11/10/24 1258   pseudoephedrine  (SUDAFED) 12 hr tablet 120 mg  120 mg Oral BID Claiborne, Claudia, MD   120 mg at 11/10/24 0912    Allergies as of 11/09/2024 - Review Complete 11/09/2024  Allergen Reaction Noted   Penicillins Shortness Of Breath, Nausea Only, and Other (See Comments) 01/12/2012   Prednisone Hives 03/08/2014   Fentanyl  Anxiety 01/19/2015   Hydrocodone  Nausea And Vomiting 10/05/2024   Morphine  and codeine Nausea And Vomiting and Other (See Comments) 12/04/2014   Oxycontin  [oxycodone  hcl] Nausea And Vomiting 04/17/2012   Valium  [diazepam ] Other (See Comments) 05/02/2015     Review of Systems:    This is positive for those things mentioned in the HPI. All other review of systems are negative.       Physical Exam:  Vital signs in last 24 hours: Temp:  [97.6 F (36.4 C)-98.9 F (37.2 C)] 97.6 F (36.4 C) (12/27 1310) Pulse Rate:  [83-98] 94 (12/27 1310) Resp:  [16-19] 16 (12/27 0640) BP: (121-149)/(76-89) 141/84 (12/27 1310) SpO2:  [100 %] 100 % (12/27 1310) Last BM Date : 11/08/24  General:  Well-developed, well-nourished and in no acute distress Eyes:  anicteric. Lungs: Clear to auscultation bilaterally. Heart:   S1S2, no rubs, murmurs, gallops. Abdomen:  soft, mildly diffusely tender,BS+.  Extremities:   no edema Skin   no rash. Neuro:   A&O x 3.  Psych:  appropriate mood and  Affect.   Data Reviewed:   LAB RESULTS: Recent Labs    11/09/24 1612 11/10/24 0501  WBC 5.2 4.8  HGB 14.8 11.2*  HCT 43.0 32.8*  PLT 383 290   BMET Recent Labs    11/09/24  1612 11/10/24 0501  NA 138 135  K 2.8* 2.6*  CL 92* 96*  CO2 28 26  GLUCOSE 112* 117*  BUN <5* <5*  CREATININE 0.71 0.61  CALCIUM 10.0 8.8*   LFT Recent Labs    11/10/24 0501  PROT 6.9  ALBUMIN 3.3*  AST 20  ALT 31  ALKPHOS 55  BILITOT 0.6    STUDIES: Images reviewed personally CT CHEST ABDOMEN PELVIS W CONTRAST Result Date: 11/09/2024 EXAM: CT CHEST, ABDOMEN AND PELVIS WITH CONTRAST 11/09/2024 06:58:42 PM TECHNIQUE: CT of the chest, abdomen and pelvis was performed with the administration of 100 mL of iohexol  (OMNIPAQUE ) 300 MG/ML solution. Multiplanar reformatted images are provided for review. Automated exposure control, iterative reconstruction, and/or weight based adjustment of the mA/kV was utilized to reduce the radiation dose to as low as reasonably achievable. COMPARISON: 10/15/2024 CLINICAL HISTORY: Intractable vomiting. Weight loss. Crohn's disease and recent colitis. Hashimoto's thyroiditis. FINDINGS: CHEST: MEDIASTINUM AND LYMPH NODES: Heart and pericardium are unremarkable. The central airways are clear. No mediastinal, hilar or axillary lymphadenopathy. Status post left thyroidectomy. Heterogeneous enlargement of the residual right thyroid  gland, nonspecific. This is not well characterized on this examination and dedicated thyroid  sonography is recommended for further evaluation. LUNGS AND PLEURA: No focal consolidation or pulmonary edema. No pleural effusion or pneumothorax. ABDOMEN AND PELVIS: LIVER: Moderate hepatic steatosis. No intrahepatic mass. No intrahepatic biliary ductal dilation. GALLBLADDER AND BILE DUCTS: Status post cholecystectomy. No biliary ductal dilatation. SPLEEN: No acute abnormality. PANCREAS: No acute abnormality. ADRENAL  GLANDS: No acute abnormality. KIDNEYS, URETERS AND BLADDER: No stones in the kidneys or ureters. No hydronephrosis. No perinephric or periureteral stranding. Urinary bladder is unremarkable. GI AND BOWEL: Status post appendectomy. The stomach, small bowel, and large bowel are otherwise unremarkable. There is no bowel obstruction. REPRODUCTIVE ORGANS: No acute abnormality. PERITONEUM AND RETROPERITONEUM: No ascites. No free air. VASCULATURE: Aorta is normal in caliber. ABDOMINAL AND PELVIS LYMPH NODES: No lymphadenopathy. BONES AND SOFT TISSUES: No acute osseous abnormality. No focal soft tissue abnormality. IMPRESSION: 1. No acute findings. 2. Status post left thyroidectomy with heterogeneous enlargement of the residual right thyroid  gland, nonspecific. Dedicated thyroid  sonography is recommended for further evaluation. 3. Moderate hepatic steatosis 4. Status post cholecystectomy and appendectomy. Electronically signed by: Dorethia Molt MD 11/09/2024 08:48 PM EST RP Workstation: [id number]        Thanks   LOS: 1 day   @Akeylah Hendel  CHARLENA Commander, MD, [facility] @  11/10/2024, 3:07 PM     [1]  Social History Tobacco Use   Smoking status: Never   Smokeless tobacco: Never  Vaping Use   Vaping status: Never Used  Substance Use Topics   Alcohol use: Yes    Comment: weekends   Drug use: No   "

## 2024-11-10 NOTE — Progress Notes (Signed)
 Patient's potassium was 2.6. Notified MD Rojelio then started  potassium replacement as per order.

## 2024-11-10 NOTE — Progress Notes (Signed)
 " PROGRESS NOTE    Julie Ward  FMW:[id number] DOB: [birth year] DOA: 11/09/2024 PCP: Johnny Garnette LABOR, MD     Brief Narrative:  ELDEAN KLATT is a 28 y.o. female with medical history significant for Crohn's disease.  The patient was in her usual state of health until couple of months ago when she developed multiple episodes of vomiting daily.  This is not preceded with nausea she will just be laying in her bed and vomit.  She will get a couple minutes of a warning with acid reflux and brash.  She often throws up about 30 to 40 minutes after drinking or eating.  She follows closely with her gastroenterologist.  Several antiemetics have been tried but she says Zofran  works best for her. Currently she takes 4 mg of Zofran  30 minutes before every meal 3 times daily. She is still throwing up on average about 3 times a day but this is much less then she was throwing up before she started the Zofran . Reglan  did not help at all and many of the other antiemetics have given her side effects. She has low-level chronic abdominal pain which is unchanged.   New events last 24 hours / Subjective: Feels about the same  Assessment & Plan:   Principal Problem:   Vomiting Active Problems:   Crohn's disease (HCC)   Hypokalemia   Idiopathic intracranial hypertension   H/O thyroidectomy   Intractable vomiting - Followed by Dr. Avram as outpatient - Had outpatient EGD, colonoscopy, CT abdomen pelvis which have all been unrevealing - IV fluid - IV Reglan  every 6 hours  Hypokalemia - Replace  Crohn's disease  History of intracranial hypertension  History of thyroidectomy - With heterogenous enlargement of the residual right thyroid  gland, nonspecific, found incidentally on CT - Follow-up with outpatient thyroid  ultrasound   DVT prophylaxis: SCDs Start: 11/09/24 1814 Code Status: Full code Family Communication: At bedside Disposition Plan: Home Status is: Inpatient  Antimicrobials:   Anti-infectives (From admission, onward)    None        Objective: Vitals:   11/10/24 0159 11/10/24 0640 11/10/24 1017 11/10/24 1310  BP: (!) 140/76 (!) 149/84 (!) 141/88 (!) 141/84  Pulse: 83 88 98 94  Resp: 16 16    Temp: 97.9 F (36.6 C) 97.7 F (36.5 C)  97.6 F (36.4 C)  TempSrc: Oral Oral  Oral  SpO2: 100% 100% 100% 100%  Weight:      Height:        Intake/Output Summary (Last 24 hours) at 11/10/2024 1458 Last data filed at 11/10/2024 1400 Gross per 24 hour  Intake 980 ml  Output 0 ml  Net 980 ml   Filed Weights   11/09/24 1406  Weight: 89.8 kg    Examination:  General exam: Appears calm and comfortable  Respiratory system: Clear to auscultation. Respiratory effort normal. No respiratory distress. No conversational dyspnea.  Cardiovascular system: S1 & S2 heard, RRR. No murmurs. No pedal edema. Gastrointestinal system: Abdomen is nondistended, soft and nontender. Normal bowel sounds heard. Central nervous system: Alert and oriented. No focal neurological deficits. Speech clear.  Extremities: Symmetric in appearance  Skin: No rashes, lesions or ulcers on exposed skin  Psychiatry: Judgement and insight appear normal. Mood & affect appropriate.   Data Reviewed: I have personally reviewed following labs and imaging studies  CBC: Recent Labs  Lab 11/09/24 1612 11/10/24 0501  WBC 5.2 4.8  HGB 14.8 11.2*  HCT 43.0 32.8*  MCV 86.7 86.8  PLT 383 290   Basic Metabolic Panel: Recent Labs  Lab 11/09/24 1612 11/10/24 0501  NA 138 135  K 2.8* 2.6*  CL 92* 96*  CO2 28 26  GLUCOSE 112* 117*  BUN <5* <5*  CREATININE 0.71 0.61  CALCIUM 10.0 8.8*  MG 1.9  --    GFR: Estimated Creatinine Clearance: 118.3 mL/min (by C-G formula based on SCr of 0.61 mg/dL). Liver Function Tests: Recent Labs  Lab 11/09/24 1612 11/10/24 0501  AST 37 20  ALT 49* 31  ALKPHOS 74 55  BILITOT 1.0 0.6  PROT 9.6* 6.9  ALBUMIN 4.5 3.3*   Recent Labs  Lab 11/09/24 1612   LIPASE 55*   No results for input(s): AMMONIA in the last 168 hours. Coagulation Profile: No results for input(s): INR, PROTIME in the last 168 hours. Cardiac Enzymes: No results for input(s): CKTOTAL, CKMB, CKMBINDEX, TROPONINI in the last 168 hours. BNP (last 3 results) No results for input(s): PROBNP in the last 8760 hours. HbA1C: No results for input(s): HGBA1C in the last 72 hours. CBG: No results for input(s): GLUCAP in the last 168 hours. Lipid Profile: No results for input(s): CHOL, HDL, LDLCALC, TRIG, CHOLHDL, LDLDIRECT in the last 72 hours. Thyroid  Function Tests: Recent Labs    11/10/24 0502  TSH 3.550   Anemia Panel: No results for input(s): VITAMINB12, FOLATE, FERRITIN, TIBC, IRON, RETICCTPCT in the last 72 hours. Sepsis Labs: No results for input(s): PROCALCITON, LATICACIDVEN in the last 168 hours.  No results found for this or any previous visit (from the past 240 hours).    Radiology Studies: CT CHEST ABDOMEN PELVIS W CONTRAST Result Date: 11/09/2024 EXAM: CT CHEST, ABDOMEN AND PELVIS WITH CONTRAST 11/09/2024 06:58:42 PM TECHNIQUE: CT of the chest, abdomen and pelvis was performed with the administration of 100 mL of iohexol  (OMNIPAQUE ) 300 MG/ML solution. Multiplanar reformatted images are provided for review. Automated exposure control, iterative reconstruction, and/or weight based adjustment of the mA/kV was utilized to reduce the radiation dose to as low as reasonably achievable. COMPARISON: 10/15/2024 CLINICAL HISTORY: Intractable vomiting. Weight loss. Crohn's disease and recent colitis. Hashimoto's thyroiditis. FINDINGS: CHEST: MEDIASTINUM AND LYMPH NODES: Heart and pericardium are unremarkable. The central airways are clear. No mediastinal, hilar or axillary lymphadenopathy. Status post left thyroidectomy. Heterogeneous enlargement of the residual right thyroid  gland, nonspecific. This is not well characterized  on this examination and dedicated thyroid  sonography is recommended for further evaluation. LUNGS AND PLEURA: No focal consolidation or pulmonary edema. No pleural effusion or pneumothorax. ABDOMEN AND PELVIS: LIVER: Moderate hepatic steatosis. No intrahepatic mass. No intrahepatic biliary ductal dilation. GALLBLADDER AND BILE DUCTS: Status post cholecystectomy. No biliary ductal dilatation. SPLEEN: No acute abnormality. PANCREAS: No acute abnormality. ADRENAL GLANDS: No acute abnormality. KIDNEYS, URETERS AND BLADDER: No stones in the kidneys or ureters. No hydronephrosis. No perinephric or periureteral stranding. Urinary bladder is unremarkable. GI AND BOWEL: Status post appendectomy. The stomach, small bowel, and large bowel are otherwise unremarkable. There is no bowel obstruction. REPRODUCTIVE ORGANS: No acute abnormality. PERITONEUM AND RETROPERITONEUM: No ascites. No free air. VASCULATURE: Aorta is normal in caliber. ABDOMINAL AND PELVIS LYMPH NODES: No lymphadenopathy. BONES AND SOFT TISSUES: No acute osseous abnormality. No focal soft tissue abnormality. IMPRESSION: 1. No acute findings. 2. Status post left thyroidectomy with heterogeneous enlargement of the residual right thyroid  gland, nonspecific. Dedicated thyroid  sonography is recommended for further evaluation. 3. Moderate hepatic steatosis 4. Status post cholecystectomy and appendectomy. Electronically signed by: Dorethia Molt MD 11/09/2024 08:48 PM  EST RP Workstation: [id number]      Scheduled Meds:  loratadine   10 mg Oral Daily   potassium chloride   40 mEq Oral Q4H   pseudoephedrine   120 mg Oral BID   Continuous Infusions:  lactated ringers  100 mL/hr at 11/10/24 0913     LOS: 1 day   Time spent: 30 minutes   Delon Hoe, DO [facility] 11/10/2024, 2:58 PM   Available via Epic secure chat 7am-7pm After these hours, please refer to coverage provider listed on [email or web address]  "

## 2024-11-11 DIAGNOSIS — R1115 Cyclical vomiting syndrome unrelated to migraine: Secondary | ICD-10-CM

## 2024-11-11 DIAGNOSIS — E860 Dehydration: Secondary | ICD-10-CM | POA: Diagnosis not present

## 2024-11-11 DIAGNOSIS — R112 Nausea with vomiting, unspecified: Secondary | ICD-10-CM | POA: Diagnosis not present

## 2024-11-11 DIAGNOSIS — K529 Noninfective gastroenteritis and colitis, unspecified: Secondary | ICD-10-CM

## 2024-11-11 DIAGNOSIS — K50119 Crohn's disease of large intestine with unspecified complications: Secondary | ICD-10-CM | POA: Diagnosis not present

## 2024-11-11 DIAGNOSIS — E876 Hypokalemia: Secondary | ICD-10-CM | POA: Diagnosis not present

## 2024-11-11 LAB — BASIC METABOLIC PANEL WITH GFR
Anion gap: 12 (ref 5–15)
BUN: <5 mg/dL — ABNORMAL LOW (ref 6–20)
CO2: 25 mmol/L (ref 22–32)
Calcium: 8.9 mg/dL (ref 8.9–10.3)
Chloride: 100 mmol/L (ref 98–111)
Creatinine, Ser: 0.58 mg/dL (ref 0.44–1.00)
GFR, Estimated: >60 mL/min
Glucose, Bld: 122 mg/dL — ABNORMAL HIGH (ref 70–99)
Potassium: 3.2 mmol/L — ABNORMAL LOW (ref 3.5–5.1)
Sodium: 137 mmol/L (ref 135–145)

## 2024-11-11 LAB — ACTH STIMULATION, 3 TIME POINTS
Cortisol, 30 Min: 24.7 ug/dL
Cortisol, 60 Min: 25.2 ug/dL
Cortisol, Base: 11.6 ug/dL

## 2024-11-11 LAB — MAGNESIUM: Magnesium: 1.7 mg/dL (ref 1.7–2.4)

## 2024-11-11 MED ORDER — POTASSIUM CHLORIDE CRYS ER 20 MEQ PO TBCR
40.0000 meq | EXTENDED_RELEASE_TABLET | ORAL | Status: AC
  Administered 2024-11-11 (×2): 40 meq via ORAL
  Filled 2024-11-11 (×2): qty 2

## 2024-11-11 NOTE — Progress Notes (Signed)
 " PROGRESS NOTE    Julie Ward  FMW:[id number] DOB: 09-16-1996 DOA: 11/09/2024 PCP: Johnny Garnette LABOR, MD     Brief Narrative:  Julie Ward is a 28 y.o. female with medical history significant for Crohn's disease.  The patient was in her usual state of health until couple of months ago when she developed multiple episodes of vomiting daily.  This is not preceded with nausea she will just be laying in her bed and vomit.  She will get a couple minutes of a warning with acid reflux and brash.  She often throws up about 30 to 40 minutes after drinking or eating.  She follows closely with her gastroenterologist.  Several antiemetics have been tried but she says Zofran  works best for her. Currently she takes 4 mg of Zofran  30 minutes before every meal 3 times daily. She is still throwing up on average about 3 times a day but this is much less then she was throwing up before she started the Zofran . Reglan  did not help at all and many of the other antiemetics have given her side effects. She has low-level chronic abdominal pain which is unchanged.   New events last 24 hours / Subjective: Feeling better today.  States that she did not tolerate Reglan , made her feel hot and anxious  Assessment & Plan:   Principal Problem:   Persistent vomiting Active Problems:   Crohn's disease (HCC)   Hypokalemia   Idiopathic intracranial hypertension   H/O thyroidectomy   Intractable vomiting - Followed by Dr. Avram as outpatient - Had outpatient EGD, colonoscopy, CT abdomen pelvis which have all been unrevealing - Continue Zofran  as needed - On regular diet, will discontinue IV fluid - Discussed with Dr. Avram today  Hypokalemia - Replace  Crohn's disease  History of intracranial hypertension  History of thyroidectomy - With heterogenous enlargement of the residual right thyroid  gland, nonspecific, found incidentally on CT - Follow-up with outpatient thyroid  ultrasound   DVT prophylaxis:  SCDs Start: 11/09/24 1814 Code Status: Full code Family Communication: At bedside Disposition Plan: Home Status is: Inpatient  Antimicrobials:  Anti-infectives (From admission, onward)    None        Objective: Vitals:   11/10/24 1017 11/10/24 1310 11/10/24 2031 11/11/24 0534  BP: (!) 141/88 (!) 141/84 134/81 (!) 140/86  Pulse: 98 94 (!) 101 94  Resp:   18 18  Temp:  97.6 F (36.4 C) 98.8 F (37.1 C) 98.4 F (36.9 C)  TempSrc:  Oral Oral Oral  SpO2: 100% 100% 100% 100%  Weight:      Height:        Intake/Output Summary (Last 24 hours) at 11/11/2024 1330 Last data filed at 11/11/2024 1322 Gross per 24 hour  Intake 3085.62 ml  Output --  Net 3085.62 ml   Filed Weights   11/09/24 1406  Weight: 89.8 kg    Examination:  General exam: Appears calm and comfortable  Respiratory system: Respiratory effort normal. No respiratory distress. No conversational dyspnea.  Gastrointestinal system: Abdomen is nondistended Central nervous system: Alert and oriented. No focal neurological deficits. Speech clear. Psychiatry: Judgement and insight appear normal. Mood & affect appropriate.   Data Reviewed: I have personally reviewed following labs and imaging studies  CBC: Recent Labs  Lab 11/09/24 1612 11/10/24 0501  WBC 5.2 4.8  HGB 14.8 11.2*  HCT 43.0 32.8*  MCV 86.7 86.8  PLT 383 290   Basic Metabolic Panel: Recent Labs  Lab 11/09/24 1612  11/10/24 0501 11/11/24 0734  NA 138 135 137  K 2.8* 2.6* 3.2*  CL 92* 96* 100  CO2 28 26 25   GLUCOSE 112* 117* 122*  BUN <5* <5* <5*  CREATININE 0.71 0.61 0.58  CALCIUM 10.0 8.8* 8.9  MG 1.9  --  1.7   GFR: Estimated Creatinine Clearance: 118.3 mL/min (by C-G formula based on SCr of 0.58 mg/dL). Liver Function Tests: Recent Labs  Lab 11/09/24 1612 11/10/24 0501  AST 37 20  ALT 49* 31  ALKPHOS 74 55  BILITOT 1.0 0.6  PROT 9.6* 6.9  ALBUMIN 4.5 3.3*   Recent Labs  Lab 11/09/24 1612  LIPASE 55*   No  results for input(s): AMMONIA in the last 168 hours. Coagulation Profile: No results for input(s): INR, PROTIME in the last 168 hours. Cardiac Enzymes: No results for input(s): CKTOTAL, CKMB, CKMBINDEX, TROPONINI in the last 168 hours. BNP (last 3 results) No results for input(s): PROBNP in the last 8760 hours. HbA1C: No results for input(s): HGBA1C in the last 72 hours. CBG: No results for input(s): GLUCAP in the last 168 hours. Lipid Profile: No results for input(s): CHOL, HDL, LDLCALC, TRIG, CHOLHDL, LDLDIRECT in the last 72 hours. Thyroid  Function Tests: Recent Labs    11/10/24 0502  TSH 3.550   Anemia Panel: No results for input(s): VITAMINB12, FOLATE, FERRITIN, TIBC, IRON, RETICCTPCT in the last 72 hours. Sepsis Labs: No results for input(s): PROCALCITON, LATICACIDVEN in the last 168 hours.  No results found for this or any previous visit (from the past 240 hours).    Radiology Studies: CT CHEST ABDOMEN PELVIS W CONTRAST Result Date: 11/09/2024 EXAM: CT CHEST, ABDOMEN AND PELVIS WITH CONTRAST 11/09/2024 06:58:42 PM TECHNIQUE: CT of the chest, abdomen and pelvis was performed with the administration of 100 mL of iohexol  (OMNIPAQUE ) 300 MG/ML solution. Multiplanar reformatted images are provided for review. Automated exposure control, iterative reconstruction, and/or weight based adjustment of the mA/kV was utilized to reduce the radiation dose to as low as reasonably achievable. COMPARISON: 10/15/2024 CLINICAL HISTORY: Intractable vomiting. Weight loss. Crohn's disease and recent colitis. Hashimoto's thyroiditis. FINDINGS: CHEST: MEDIASTINUM AND LYMPH NODES: Heart and pericardium are unremarkable. The central airways are clear. No mediastinal, hilar or axillary lymphadenopathy. Status post left thyroidectomy. Heterogeneous enlargement of the residual right thyroid  gland, nonspecific. This is not well characterized on this examination  and dedicated thyroid  sonography is recommended for further evaluation. LUNGS AND PLEURA: No focal consolidation or pulmonary edema. No pleural effusion or pneumothorax. ABDOMEN AND PELVIS: LIVER: Moderate hepatic steatosis. No intrahepatic mass. No intrahepatic biliary ductal dilation. GALLBLADDER AND BILE DUCTS: Status post cholecystectomy. No biliary ductal dilatation. SPLEEN: No acute abnormality. PANCREAS: No acute abnormality. ADRENAL GLANDS: No acute abnormality. KIDNEYS, URETERS AND BLADDER: No stones in the kidneys or ureters. No hydronephrosis. No perinephric or periureteral stranding. Urinary bladder is unremarkable. GI AND BOWEL: Status post appendectomy. The stomach, small bowel, and large bowel are otherwise unremarkable. There is no bowel obstruction. REPRODUCTIVE ORGANS: No acute abnormality. PERITONEUM AND RETROPERITONEUM: No ascites. No free air. VASCULATURE: Aorta is normal in caliber. ABDOMINAL AND PELVIS LYMPH NODES: No lymphadenopathy. BONES AND SOFT TISSUES: No acute osseous abnormality. No focal soft tissue abnormality. IMPRESSION: 1. No acute findings. 2. Status post left thyroidectomy with heterogeneous enlargement of the residual right thyroid  gland, nonspecific. Dedicated thyroid  sonography is recommended for further evaluation. 3. Moderate hepatic steatosis 4. Status post cholecystectomy and appendectomy. Electronically signed by: Dorethia Molt MD 11/09/2024 08:48 PM EST RP Workstation:  [id number]      Scheduled Meds:  loratadine   10 mg Oral Daily   potassium chloride   40 mEq Oral Q4H   pseudoephedrine   120 mg Oral BID   Continuous Infusions:     LOS: 2 days   Time spent: 25 minutes   Delon Hoe, DO [facility] 11/11/2024, 1:30 PM   Available via Epic secure chat 7am-7pm After these hours, please refer to coverage provider listed on [email or web address]  "

## 2024-11-11 NOTE — Plan of Care (Signed)

## 2024-11-11 NOTE — Progress Notes (Signed)
"      ° °  Patient Name: Julie Ward Date of Encounter: 11/11/2024, 10:56 AM     Assessment and Plan  Persistent nausea and vomiting with very poor intake and weight loss over the past several weeks.   Low morning cortisol, Cortrosyn  stimulation test in progress  History of delayed gastric emptying at 2 hours of a 4-hour emptying scan with overall normal results at 4 hours  Crohn's disease with a history of Crohn's ileitis now with diffuse colitis but no ileitis, question Crohn's versus self-limited colitis as diarrhea has resolved   Profound hypokalemia and dehydration, improving  History of idiopathic intracranial hypertension apparently resolved.  2022 head CT scan showed thinning of pituitary though it could have been due to optic nerve edema.  Recent CT head normal.  Postnasal drip-improved with pseudoephedrine  and loratadine   ---------------------------------------------------------------------------------------------------------------------------------  Continue current care.  Ondansetron  is helping with nausea and vomiting.  Continue potassium supplementation per [facility]  Follow-up Cortrosyn  stimulation test results  Diarrhea resolved, I am not confident that the colitis found at recent colonoscopy is Crohn's.  Do not restart budesonide  or other treatment for that at this time.  Hopefully home tomorrow  She already has an appointment with me on November 27, 2024     Subjective  She is feeling better.  Eating small amounts of solid food.  Still with some abdominal pain.  She had a small stool and is passing flatus.  She has not been vomiting.  Reglan  made her feel hot and funny and was discontinued.   Objective  BP (!) 140/86 (BP Location: Right Arm)   Pulse 94   Temp 98.4 F (36.9 C) (Oral)   Resp 18   Ht 5' 7 (1.702 m)   Wt 89.8 kg   LMP 11/01/2024 (Exact Date)   SpO2 100%   BMI 31.01 kg/m  No acute distress Abdomen soft mildly tender diffusely bowel sounds  present  Recent Labs  Lab 11/09/24 1612 11/10/24 0501 11/11/24 0734  NA 138 135 137  K 2.8* 2.6* 3.2*  CL 92* 96* 100  CO2 28 26 25   GLUCOSE 112* 117* 122*  BUN <5* <5* <5*  CREATININE 0.71 0.61 0.58  CALCIUM 10.0 8.8* 8.9  MG 1.9  --  1.7   AM cortisol 12/27 6.4 Lab Results  Component Value Date   TSH 3.550 11/10/2024      [name], MD, [facility] See TRACEY on call - gastroenterology for best contact person 11/11/2024 10:56 AM   "

## 2024-11-12 ENCOUNTER — Telehealth: Payer: Self-pay | Admitting: Family Medicine

## 2024-11-12 DIAGNOSIS — E876 Hypokalemia: Secondary | ICD-10-CM | POA: Diagnosis not present

## 2024-11-12 DIAGNOSIS — R112 Nausea with vomiting, unspecified: Secondary | ICD-10-CM | POA: Diagnosis not present

## 2024-11-12 DIAGNOSIS — D649 Anemia, unspecified: Secondary | ICD-10-CM | POA: Diagnosis not present

## 2024-11-12 DIAGNOSIS — E860 Dehydration: Secondary | ICD-10-CM | POA: Diagnosis not present

## 2024-11-12 DIAGNOSIS — R1115 Cyclical vomiting syndrome unrelated to migraine: Secondary | ICD-10-CM | POA: Diagnosis not present

## 2024-11-12 DIAGNOSIS — K50919 Crohn's disease, unspecified, with unspecified complications: Secondary | ICD-10-CM | POA: Diagnosis not present

## 2024-11-12 DIAGNOSIS — K760 Fatty (change of) liver, not elsewhere classified: Secondary | ICD-10-CM | POA: Diagnosis not present

## 2024-11-12 LAB — CBC
HCT: 31.9 % — ABNORMAL LOW (ref 36.0–46.0)
Hemoglobin: 10.6 g/dL — ABNORMAL LOW (ref 12.0–15.0)
MCH: 29.9 pg (ref 26.0–34.0)
MCHC: 33.2 g/dL (ref 30.0–36.0)
MCV: 90.1 fL (ref 80.0–100.0)
Platelets: 252 K/uL (ref 150–400)
RBC: 3.54 MIL/uL — ABNORMAL LOW (ref 3.87–5.11)
RDW: 13.5 % (ref 11.5–15.5)
WBC: 5 K/uL (ref 4.0–10.5)
nRBC: 0 % (ref 0.0–0.2)

## 2024-11-12 LAB — BASIC METABOLIC PANEL WITH GFR
Anion gap: 9 (ref 5–15)
BUN: <5 mg/dL — ABNORMAL LOW (ref 6–20)
CO2: 24 mmol/L (ref 22–32)
Calcium: 8.6 mg/dL — ABNORMAL LOW (ref 8.9–10.3)
Chloride: 106 mmol/L (ref 98–111)
Creatinine, Ser: 0.6 mg/dL (ref 0.44–1.00)
GFR, Estimated: >60 mL/min
Glucose, Bld: 123 mg/dL — ABNORMAL HIGH (ref 70–99)
Potassium: 3.5 mmol/L (ref 3.5–5.1)
Sodium: 138 mmol/L (ref 135–145)

## 2024-11-12 MED ORDER — POTASSIUM CHLORIDE CRYS ER 20 MEQ PO TBCR
40.0000 meq | EXTENDED_RELEASE_TABLET | Freq: Once | ORAL | Status: AC
  Administered 2024-11-12: 40 meq via ORAL
  Filled 2024-11-12: qty 2

## 2024-11-12 MED ORDER — ONDANSETRON HCL 4 MG PO TABS: 4.0000 mg | ORAL_TABLET | Freq: Three times a day (TID) | ORAL | Status: DC | PRN

## 2024-11-12 NOTE — Plan of Care (Signed)

## 2024-11-12 NOTE — Progress Notes (Signed)
" °   11/12/24 1030  TOC Brief Assessment  Insurance and Status Reviewed  Patient has primary care physician Yes  Home environment has been reviewed resides in a private residence  Prior level of function: Independent  Prior/Current Home Services No current home services  Social Drivers of Health Review SDOH reviewed no interventions necessary  Readmission risk has been reviewed Yes  Transition of care needs no transition of care needs at this time    "

## 2024-11-12 NOTE — Telephone Encounter (Signed)
 Pt called in asking for an appointment as she states her eyes are worsening and the MD at the hospital has advised her to call and make an appointment. Phone rep reached out to Amy, NP re: pt's request and the response from NP was relayed to pt :She needs to get a referral from MD at hospital or from PCP and needs to see MD.

## 2024-11-12 NOTE — Progress Notes (Addendum)
 "    [facility] Progress Note  CC: Abdominal pain and vomiting, Crohn's disease  Subjective: She describes having mild nausea after eating and feels like food sits her esophagus before passing into her stomach. She did not wish to take Ondansetron  today as  she wanted to see if she could tolerate eating without it.  She eats chicken nuggets and potato which is soup for lunch felt slightly nauseous after eating.  No vomiting.  No pain.  Last BM was 3 days ago.  Mother at the bedside.   Objective:  Vital signs in last 24 hours: Temp:  [97.5 F (36.4 C)-98.2 F (36.8 C)] 98.1 F (36.7 C) (12/29 1348) Pulse Rate:  [92-95] 93 (12/29 1348) Resp:  [16-17] 16 (12/29 1348) BP: (130-155)/(79-97) 140/97 (12/29 1348) SpO2:  [99 %-100 %] 100 % (12/29 1348) Last BM Date : 11/10/24 General: Alert-year-old female in no acute distress. Heart: Regular rate and rhythm, no murmurs. Pulm: Breath sounds clear throughout.  On room air. Abdomen: Soft, nondistended.  Mild tenderness to the RLQ without rebound or guarding (patient stated RLQ tenderness is chronic).  Positive bowel sounds to all 4 quadrants. Extremities: No lower extremity edema. Neurologic:  Alert and oriented x 4. Grossly normal neurologically. Psych:  Alert and cooperative. Normal mood and affect.  Intake/Output from previous day: 12/28 0701 - 12/29 0700 In: 690 [P.O.:690] Out: -  Intake/Output this shift: Total I/O In: 360 [P.O.:360] Out: -   Lab Results: Recent Labs    11/09/24 1612 11/10/24 0501 11/12/24 0459  WBC 5.2 4.8 5.0  HGB 14.8 11.2* 10.6*  HCT 43.0 32.8* 31.9*  PLT 383 290 252   BMET Recent Labs    11/10/24 0501 11/11/24 0734 11/12/24 0459  NA 135 137 138  K 2.6* 3.2* 3.5  CL 96* 100 106  CO2 26 25 24   GLUCOSE 117* 122* 123*  BUN <5* <5* <5*  CREATININE 0.61 0.58 0.60  CALCIUM 8.8* 8.9 8.6*   LFT Recent Labs    11/10/24 0501  PROT 6.9  ALBUMIN 3.3*  AST 20  ALT 31  ALKPHOS 55   BILITOT 0.6   PT/INR No results for input(s): LABPROT, INR in the last 72 hours. Hepatitis Panel No results for input(s): HEPBSAG, HCVAB, HEPAIGM, HEPBIGM in the last 72 hours.  No results found.  Assessment / Plan:   28 year old female admitted 11/09/2024 with N/V, abdominal pain with very poor intake and weight loss over the past several weeks.  CTAP with contrast 11/09/2024 without acute intra-abdominal/pelvic pathology to explain her symptoms.  Patient describes feeling as if food sits in her esophagus for a while before passing into the stomach which then triggers nausea.  Normal EGD as an outpatient 10/05/2024. History of delayed gastric emptying at 2 hours of a 4-hour emptying scan with overall normal results at 4 hours. - Regular diet as tolerated - Ondansetron  4 mg p.o. or IV every 6 hours as needed.  Encouraged patient to take Ondansetron  15 to 20 minutes before meals. - Consider barium swallow study with tablet as an inpatient versus outpatient - Await further recommendations per Dr. San - Hopefully will be discharged home tomorrow   Low morning cortisol ( 6.4 on 12/27). Cortrosyn  stimulation test 12/28: Base cortisol level 11.6.  30-minute cortisol level 24.7.  60-minute cortisol level 25.2.   Crohn's disease with a history of Crohn's ileitis now with diffuse colitis but no ileitis, question Crohn's versus self-limited colitis as diarrhea has resolved  -  Patient to follow up with Dr. Avram 11/27/2024 as planned   Normocytic anemia. Hg 14.8 -> 11.2 -> today Hg 10.6.    Profound hypokalemia and dehydration, improving. K+ 3.5.    History of idiopathic intracranial hypertension apparently resolved.  Head CT scan 2022 showed thinning of pituitary though it could have been due to optic nerve edema.  Head CT 11/05/2024 was normal.   Postnasal drip, improved with Pseudoephedrine  and Loratadine   Hepatic steatosis per CTAP. -Follow-up with Dr. Avram  11/27/2024 as noted above  Principal Problem:   Persistent vomiting Active Problems:   Crohn's disease (HCC)   Hypokalemia   Idiopathic intracranial hypertension   H/O thyroidectomy     LOS: 3 days   Elida HERO Kennedy-Smith  11/12/2024, 3:06 PM   Attending physician's note   I have taken a history, reviewed the chart, and examined the patient. I performed a substantive portion of this encounter, including complete performance of at least one of the key components, in conjunction with the APP. I agree with the APP's note, impression, and recommendations with my edits.   I had extensive conversation with the patient and grandmother at bedside today.  Slowly increasing p.o. intake.  She is hoping for discharge tomorrow morning.  - Changed Zofran  to oral and encouraged to use if needed - Slowly advance diet as tolerated - Discussed potential etiologies for your GI symptoms and diet to follow on discharge - Discussed her Crohn's disease at length today.  She has follow-up scheduled with Dr. Avram on 11/27/2024 - Inpatient GI service will sign off at this time.  Please do not hesitate to contact us  with additional questions or concerns  Sanita Estrada, DO, FACG [phone] office   A total of 50 minutes of time was spent on this encounter, including in depth chart review, independent review of results as outlined above, communicating results with the patient directly, face-to-face time with the patient, coordinating care, and ordering studies and medications as appropriate, and documentation.           "

## 2024-11-12 NOTE — Progress Notes (Signed)
 " PROGRESS NOTE    Julie Ward  FMW:[id number] DOB: 11/21/1995 DOA: 11/09/2024 PCP: Johnny Garnette LABOR, MD     Brief Narrative:  Julie Ward is a 28 y.o. female with medical history significant for Crohn's disease.  The patient was in her usual state of health until couple of months ago when she developed multiple episodes of vomiting daily.  This is not preceded with nausea she will just be laying in her bed and vomit.  She will get a couple minutes of a warning with acid reflux and brash.  She often throws up about 30 to 40 minutes after drinking or eating.  She follows closely with her gastroenterologist.  Several antiemetics have been tried but she says Zofran  works best for her. Currently she takes 4 mg of Zofran  30 minutes before every meal 3 times daily. She is still throwing up on average about 3 times a day but this is much less then she was throwing up before she started the Zofran . Reglan  did not help at all and many of the other antiemetics have given her side effects. She has low-level chronic abdominal pain which is unchanged.   New events last 24 hours / Subjective: States that she is not feeling great today.  ACTH  stim test was negative  Assessment & Plan:   Principal Problem:   Persistent vomiting Active Problems:   Crohn's disease (HCC)   Hypokalemia   Idiopathic intracranial hypertension   H/O thyroidectomy   Intractable vomiting - Followed by Dr. Avram as outpatient - Had outpatient EGD, colonoscopy, CT abdomen pelvis which have all been unrevealing - Continue Zofran  as needed - On regular diet, will discontinue IV fluid  Hypokalemia - Replace  Crohn's disease  History of intracranial hypertension  History of thyroidectomy - With heterogenous enlargement of the residual right thyroid  gland, nonspecific, found incidentally on CT - Follow-up with outpatient thyroid  ultrasound   DVT prophylaxis: SCDs Start: 11/09/24 1814 Code Status: Full code Family  Communication: At bedside Disposition Plan: Home Status is: Inpatient  Antimicrobials:  Anti-infectives (From admission, onward)    None        Objective: Vitals:   11/11/24 0534 11/11/24 1343 11/11/24 2049 11/12/24 0505  BP: (!) 140/86 126/83 130/79 (!) 155/97  Pulse: 94  95 92  Resp: 18 18 17 16   Temp: 98.4 F (36.9 C) 97.8 F (36.6 C) (!) 97.5 F (36.4 C) 98.2 F (36.8 C)  TempSrc: Oral Oral Oral   SpO2: 100% 98% 100% 99%  Weight:      Height:        Intake/Output Summary (Last 24 hours) at 11/12/2024 1302 Last data filed at 11/12/2024 1000 Gross per 24 hour  Intake 690 ml  Output --  Net 690 ml   Filed Weights   11/09/24 1406  Weight: 89.8 kg   Examination: General exam: Appears calm and comfortable  Respiratory system: Clear to auscultation. Respiratory effort normal. Cardiovascular system: S1 & S2 heard, RRR. No pedal edema. Gastrointestinal system: Abdomen is nondistended, soft and nontender. Normal bowel sounds heard. Central nervous system: Alert and oriented. Non focal exam. Speech clear  Extremities: Symmetric in appearance bilaterally  Skin: No rashes, lesions or ulcers on exposed skin  Psychiatry: Judgement and insight appear stable. Mood & affect appropriate.    Data Reviewed: I have personally reviewed following labs and imaging studies  CBC: Recent Labs  Lab 11/09/24 1612 11/10/24 0501 11/12/24 0459  WBC 5.2 4.8 5.0  HGB 14.8  11.2* 10.6*  HCT 43.0 32.8* 31.9*  MCV 86.7 86.8 90.1  PLT 383 290 252   Basic Metabolic Panel: Recent Labs  Lab 11/09/24 1612 11/10/24 0501 11/11/24 0734 11/12/24 0459  NA 138 135 137 138  K 2.8* 2.6* 3.2* 3.5  CL 92* 96* 100 106  CO2 28 26 25 24   GLUCOSE 112* 117* 122* 123*  BUN <5* <5* <5* <5*  CREATININE 0.71 0.61 0.58 0.60  CALCIUM 10.0 8.8* 8.9 8.6*  MG 1.9  --  1.7  --    GFR: Estimated Creatinine Clearance: 118.3 mL/min (by C-G formula based on SCr of 0.6 mg/dL). Liver Function  Tests: Recent Labs  Lab 11/09/24 1612 11/10/24 0501  AST 37 20  ALT 49* 31  ALKPHOS 74 55  BILITOT 1.0 0.6  PROT 9.6* 6.9  ALBUMIN 4.5 3.3*   Recent Labs  Lab 11/09/24 1612  LIPASE 55*   No results for input(s): AMMONIA in the last 168 hours. Coagulation Profile: No results for input(s): INR, PROTIME in the last 168 hours. Cardiac Enzymes: No results for input(s): CKTOTAL, CKMB, CKMBINDEX, TROPONINI in the last 168 hours. BNP (last 3 results) No results for input(s): PROBNP in the last 8760 hours. HbA1C: No results for input(s): HGBA1C in the last 72 hours. CBG: No results for input(s): GLUCAP in the last 168 hours. Lipid Profile: No results for input(s): CHOL, HDL, LDLCALC, TRIG, CHOLHDL, LDLDIRECT in the last 72 hours. Thyroid  Function Tests: Recent Labs    11/10/24 0502  TSH 3.550   Anemia Panel: No results for input(s): VITAMINB12, FOLATE, FERRITIN, TIBC, IRON, RETICCTPCT in the last 72 hours. Sepsis Labs: No results for input(s): PROCALCITON, LATICACIDVEN in the last 168 hours.  No results found for this or any previous visit (from the past 240 hours).    Radiology Studies: No results found.     Scheduled Meds:  loratadine   10 mg Oral Daily   pseudoephedrine   120 mg Oral BID   Continuous Infusions:     LOS: 3 days   Time spent: 25 minutes   Delon Hoe, DO [facility] 11/12/2024, 1:02 PM   Available via Epic secure chat 7am-7pm After these hours, please refer to coverage provider listed on [email or web address]  "

## 2024-11-13 DIAGNOSIS — R1115 Cyclical vomiting syndrome unrelated to migraine: Secondary | ICD-10-CM | POA: Diagnosis not present

## 2024-11-13 LAB — BASIC METABOLIC PANEL WITH GFR
Anion gap: 13 (ref 5–15)
BUN: <5 mg/dL — ABNORMAL LOW (ref 6–20)
CO2: 22 mmol/L (ref 22–32)
Calcium: 8.8 mg/dL — ABNORMAL LOW (ref 8.9–10.3)
Chloride: 103 mmol/L (ref 98–111)
Creatinine, Ser: 0.63 mg/dL (ref 0.44–1.00)
GFR, Estimated: >60 mL/min
Glucose, Bld: 114 mg/dL — ABNORMAL HIGH (ref 70–99)
Potassium: 3.6 mmol/L (ref 3.5–5.1)
Sodium: 138 mmol/L (ref 135–145)

## 2024-11-13 LAB — MAGNESIUM: Magnesium: 1.7 mg/dL (ref 1.7–2.4)

## 2024-11-13 LAB — VITAMIN D 25 HYDROXY (VIT D DEFICIENCY, FRACTURES): Vit D, 25-Hydroxy: <6 ng/mL — ABNORMAL LOW (ref 30–100)

## 2024-11-13 LAB — VITAMIN B12: Vitamin B-12: 687 pg/mL (ref 180–914)

## 2024-11-13 MED ORDER — POTASSIUM CHLORIDE CRYS ER 20 MEQ PO TBCR
40.0000 meq | EXTENDED_RELEASE_TABLET | Freq: Once | ORAL | Status: AC
  Administered 2024-11-13: 40 meq via ORAL
  Filled 2024-11-13: qty 2

## 2024-11-13 NOTE — Plan of Care (Signed)
  Problem: Nutrition: Goal: Adequate nutrition will be maintained Outcome: Progressing   Problem: Coping: Goal: Level of anxiety will decrease Outcome: Progressing   Problem: Elimination: Goal: Will not experience complications related to bowel motility Outcome: Progressing Goal: Will not experience complications related to urinary retention Outcome: Progressing   Problem: Pain Managment: Goal: General experience of comfort will improve and/or be controlled Outcome: Progressing

## 2024-11-13 NOTE — Evaluation (Signed)
 Occupational Therapy Evaluation Patient Details Name: Julie Ward MRN: [id number] DOB: March 01, 1996 Today's Date: 11/13/2024   History of Present Illness   Pt is a 28 y/o female presenting with progressive N&V at home. Found to have hypokalemia. Of note, pt reports recent EGD was clear and recent colonoscopy showed colitis. On 12/30, pt reports acute change in sensation of lower extremities and torso, as well as vision changes. Pending further workup. PMH: Crohn's disease, idiopathic intracranial HTN, GERD, hx of L thyroid  lobectomy.     Clinical Impressions PTA, pt lives alone and typically completely Independent with all daily tasks. 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 Pt reports initially on admission, she was ambulating independently. Now pt reports new progressive numbness/pins&needles sensation in BLE up to torso and around L elbow impacting independence. Pt currently reliant on RW to ambulate in hallway with CGA-Min A to correct one LOB. Significant shakiness also noted in BLE with mobility. Pt requires Setup to Min A for ADL completion. BUE strength WFL but does report some issues reaching to obtain items accurately. Pt endorses what is described as halos in her vision; endorses some astigmatism at baseline. As pt is below her functional baseline and at high risk for falls, pt may benefit from intensive rehab services > 3 hours therapy per day pending further medical workup and potential acute improvements. Family present and endorse ability to provide increased assist as needed upon DC. Will continue to follow closely while admitted.      If plan is discharge home, recommend the following:   A little help with walking and/or transfers;A little help with bathing/dressing/bathroom;Assistance with cooking/housework;Help with stairs or ramp for entrance;Assist for transportation     Functional Status Assessment   Patient has had a recent decline in their  functional status and demonstrates the ability to make significant improvements in function in a reasonable and predictable amount of time.     Equipment Recommendations   Other (comment) (RW)     Recommendations for Other Services   Rehab consult     Precautions/Restrictions   Precautions Precautions: Fall Restrictions Weight Bearing Restrictions Per Provider Order: No     Mobility Bed Mobility Overal bed mobility: Modified Independent                  Transfers Overall transfer level: Needs assistance Equipment used: None Transfers: Sit to/from Stand Sit to Stand: Contact guard assist           General transfer comment: handheld assist from family to stand. Provided RW for mobility in hallway      Balance Overall balance assessment: Needs assistance Sitting-balance support: No upper extremity supported, Feet supported Sitting balance-Leahy Scale: Good     Standing balance support: Bilateral upper extremity supported, During functional activity Standing balance-Leahy Scale: Poor Standing balance comment: progressive shakiness in standing without UE support. reliant on BUE for mobility                           ADL either performed or assessed with clinical judgement   ADL Overall ADL's : Needs assistance/impaired Eating/Feeding: Independent   Grooming: Contact guard assist;Standing   Upper Body Bathing: Set up;Sitting   Lower Body Bathing: Contact guard assist;Sitting/lateral leans;Sit to/from stand   Upper Body Dressing : Set up;Sitting   Lower Body Dressing: Minimal assistance;Sitting/lateral leans;Sit to/from stand   Toilet Transfer: Minimal assistance;Contact guard assist;Ambulation;Rolling walker (2 wheels)   Toileting-  Clothing Manipulation and Hygiene: Contact guard assist;Sitting/lateral lean;Sit to/from stand       Functional mobility during ADLs: Minimal assistance;Contact guard assist;Rolling walker (2  wheels) General ADL Comments: Pt now reliant on RW for safe mobility. When out in hall, noted BLE shakiness progressing with activity. One LOB in hallway with Min A to correct. Noted difficulty sequencing steps, impaired proprioception of BLE?     Vision Baseline Vision/History: 1 Wears glasses Ability to See in Adequate Light: 0 Adequate Patient Visual Report: Other (comment) (reports seeing more halos) Vision Assessment?: Vision impaired- to be further tested in functional context Additional Comments: reports she feels like she is seeing more halos around lights, does endorse potential astigmatism at baseline but feels this is worse when looking at lights on the unit. Reports increased time needed to focus on laptop screen     Perception         Praxis         Pertinent Vitals/Pain Pain Assessment Pain Assessment: No/denies pain     Extremity/Trunk Assessment Upper Extremity Assessment Upper Extremity Assessment: Right hand dominant;LUE deficits/detail;RUE deficits/detail RUE Deficits / Details: strength 4+/5, coordination appears WFL but pt does report feeling more clumsy when reaching for things, denies dropping items RUE Sensation: decreased light touch LUE Deficits / Details: strength 4+/5, coordination appears [facility] but pt does report feeling more clumsy when reaching for things, denies dropping items. reports sensation changes (pins/needles) under elbow area LUE Sensation: decreased light touch   Lower Extremity Assessment Lower Extremity Assessment: Defer to PT evaluation   Cervical / Trunk Assessment Cervical / Trunk Assessment: Other exceptions Cervical / Trunk Exceptions: reports pins & needles/some numbness in trunk   Communication Communication Communication: No apparent difficulties   Cognition Arousal: Alert Behavior During Therapy: WFL for tasks assessed/performed Cognition: No apparent impairments                               Following  commands: Intact       Cueing  General Comments   Cueing Techniques: Verbal cues;Gestural cues  Multiple family members present, endorse ability to assist pt at DC   Exercises     Shoulder Instructions      Home Living Family/patient expects to be discharged to:: Private residence Living Arrangements: Alone Available Help at Discharge: Family;Available 24 hours/day Type of Home: House Home Access: Stairs to enter [employer] of Steps: 1   Home Layout: One level     Bathroom Shower/Tub: Producer, Television/film/video: Standard     Home Equipment: None          Prior Functioning/Environment Prior Level of Function : Independent/Modified Independent;Working/employed;Driving             Mobility Comments: no AD ADLs Comments: 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 very independent    OT Problem List: Decreased activity tolerance;Impaired balance (sitting and/or standing);Impaired vision/perception;Decreased coordination;Decreased knowledge of use of DME or AE;Impaired sensation   OT Treatment/Interventions: Self-care/ADL training;Therapeutic exercise;Energy conservation;DME and/or AE instruction;Therapeutic activities;Visual/perceptual remediation/compensation;Patient/family education;Balance training      OT Goals(Current goals can be found in the care plan section)   Acute Rehab OT Goals Patient Stated Goal: hopeful to figure out what has changed sensation OT Goal Formulation: With patient/family Time For Goal Achievement: 11/27/24 Potential to Achieve Goals: Good   OT Frequency:  Min 2X/week    Co-evaluation  AM-PAC OT 6 Clicks Daily Activity     Outcome Measure Help from another person eating meals?: None Help from another person taking care of personal grooming?: A Little Help from another person toileting, which includes using toliet, bedpan, or urinal?: A Little Help from another person bathing  (including washing, rinsing, drying)?: A Little Help from another person to put on and taking off regular upper body clothing?: A Little Help from another person to put on and taking off regular lower body clothing?: A Little 6 Click Score: 19   End of Session Equipment Utilized During Treatment: Gait belt;Rolling walker (2 wheels) Nurse Communication: Mobility status;Other (comment) (Asked MD for neuro consult if able)  Activity Tolerance: Patient tolerated treatment well Patient left: in bed;with call bell/phone within reach;with family/visitor present  OT Visit Diagnosis: Unsteadiness on feet (R26.81);Other abnormalities of gait and mobility (R26.89);Other (comment) (sensation changes)                Time: 8664-8645 OT Time Calculation (min): 19 min Charges:  OT General Charges $OT Visit: 1 Visit OT Evaluation $OT Eval Moderate Complexity: 1 Mod  Mliss NOVAK, OTR/L Acute Rehab Services Office: [phone]   Mliss Fish 11/13/2024, 2:23 PM

## 2024-11-13 NOTE — Progress Notes (Signed)
 Inpatient Rehab Admissions Coordinator:   Per therapy recommendations, patient was screened for CIR candidacy by Leita Kleine, MS, CCC-SLP. At this time, Pt. does not appear to demonstrate medical necessity to justify in hospital rehabilitation/CIR. will not pursue a rehab consult for this Pt.   Recommend other rehab venues to be pursued.  Please contact me with any questions.  Leita Kleine, MS, CCC-SLP Rehab Admissions Coordinator  [phone] (celll) [phone] (office)

## 2024-11-13 NOTE — Progress Notes (Signed)
 Mobility Specialist - Progress Note   11/13/24 1533  Mobility  Activity Ambulated with assistance  Level of Assistance Minimal assist, patient does 75% or more  Assistive Device Front wheel walker  Distance Ambulated (ft) 85 ft  Activity Response Tolerated well  Mobility visit 1 Mobility  Mobility Specialist Start Time (ACUTE ONLY) 1517  Mobility Specialist Stop Time (ACUTE ONLY) 1533  Mobility Specialist Time Calculation (min) (ACUTE ONLY) 16 min   Pt received in bed and was eager to mobilize. Pt required Min A to stand from EOB and steadying assist with RW. Family followed behind with recliner. Pt stated feeling unsteady while ambulating, and returned to room at EOS. Left with call bell at side and family in room.   Julie Ward Mobility Specialist Acute Rehabilitation Services 11/13/2024, 3:38 PM

## 2024-11-13 NOTE — Progress Notes (Signed)
"   While this writer was assessing pt, patient states,  I think I have gullian barre. Assessed for symptoms and reflexes. Patient states, I have had to start using a walker because of my legs and feet. Pain 7/10. Prn medication given. Ice pack applied at request.  Patient expressed wanting a neuro consult. MD aware. Plan of care ongoing.  "

## 2024-11-13 NOTE — Progress Notes (Addendum)
 " PROGRESS NOTE    Julie Ward  FMW:[id number] DOB: 1996/10/14 DOA: 11/09/2024 PCP: Johnny Garnette LABOR, MD     Brief Narrative:  Julie Ward is a 28 y.o. female with medical history significant for Crohn's disease.  The patient was in her usual state of health until couple of months ago when she developed multiple episodes of vomiting daily.  This is not preceded with nausea she will just be laying in her bed and vomit.  She will get a couple minutes of a warning with acid reflux and brash.  She often throws up about 30 to 40 minutes after drinking or eating.  She follows closely with her gastroenterologist.  Several antiemetics have been tried but she says Zofran  works best for her. Currently she takes 4 mg of Zofran  30 minutes before every meal 3 times daily. She is still throwing up on average about 3 times a day but this is much less then she was throwing up before she started the Zofran . Reglan  did not help at all and many of the other antiemetics have given her side effects. She has low-level chronic abdominal pain which is unchanged.   New events last 24 hours / Subjective: She admits to diffuse tingling sensations.  She states that it started in her lower extremities, has now progressed to her torso and upper extremities.  Also has weakness, using a walker to ambulate.  Asking about Guillain-Barr syndrome.  Also with vision issues, ?Astigmatism  Assessment & Plan:   Principal Problem:   Persistent vomiting Active Problems:   Crohn's disease (HCC)   Hypokalemia   Idiopathic intracranial hypertension   H/O thyroidectomy   Intractable vomiting - Followed by Dr. Avram as outpatient, has follow-up appointment scheduled 1/13 - Had outpatient EGD, colonoscopy, CT abdomen pelvis which have all been unrevealing - Continue Zofran  as needed.  Does not tolerate Reglan  - On regular diet  Hypokalemia - Replace  Crohn's disease - Was diagnosed in 2014, seen by [facility] for several  years.  Now under care of [facility]  History of intracranial hypertension - Has seen [facility] in the past. Referral placed to see them again   History of thyroidectomy - With heterogenous enlargement of the residual right thyroid  gland, nonspecific, found incidentally on CT - Follow-up with outpatient thyroid  ultrasound  Weakness - Check vit B1, B6, B12, D - PT    DVT prophylaxis: SCDs Start: 11/09/24 1814 Code Status: Full code Family Communication: At bedside Disposition Plan: Home Status is: Inpatient  Antimicrobials:  Anti-infectives (From admission, onward)    None        Objective: Vitals:   11/12/24 0505 11/12/24 1348 11/12/24 2050 11/13/24 0620  BP: (!) 155/97 (!) 140/97 (!) 140/84 (!) 139/91  Pulse: 92 93 97 94  Resp: 16 16 18 18   Temp: 98.2 F (36.8 C) 98.1 F (36.7 C) 98 F (36.7 C) 98 F (36.7 C)  TempSrc:  Oral Oral Oral  SpO2: 99% 100% 100% 100%  Weight:      Height:        Intake/Output Summary (Last 24 hours) at 11/13/2024 1256 Last data filed at 11/13/2024 1000 Gross per 24 hour  Intake 1680 ml  Output --  Net 1680 ml   Filed Weights   11/09/24 1406  Weight: 89.8 kg   Examination: General exam: Appears calm and comfortable  Respiratory system: Respiratory effort normal. Central nervous system: Alert and oriented. Speech clear.  Extremities: Symmetric in appearance  bilaterally  Skin: No rashes, lesions or ulcers on exposed skin  Psychiatry: Judgement and insight appear stable.   Data Reviewed: I have personally reviewed following labs and imaging studies  CBC: Recent Labs  Lab 11/09/24 1612 11/10/24 0501 11/12/24 0459  WBC 5.2 4.8 5.0  HGB 14.8 11.2* 10.6*  HCT 43.0 32.8* 31.9*  MCV 86.7 86.8 90.1  PLT 383 290 252   Basic Metabolic Panel: Recent Labs  Lab 11/09/24 1612 11/10/24 0501 11/11/24 0734 11/12/24 0459 11/13/24 0511  NA 138 135 137 138 138  K 2.8* 2.6* 3.2* 3.5 3.6  CL 92* 96* 100 106 103  CO2 28  26 25 24 22   GLUCOSE 112* 117* 122* 123* 114*  BUN <5* <5* <5* <5* <5*  CREATININE 0.71 0.61 0.58 0.60 0.63  CALCIUM 10.0 8.8* 8.9 8.6* 8.8*  MG 1.9  --  1.7  --  1.7   GFR: Estimated Creatinine Clearance: 118.3 mL/min (by C-G formula based on SCr of 0.63 mg/dL). Liver Function Tests: Recent Labs  Lab 11/09/24 1612 11/10/24 0501  AST 37 20  ALT 49* 31  ALKPHOS 74 55  BILITOT 1.0 0.6  PROT 9.6* 6.9  ALBUMIN 4.5 3.3*   Recent Labs  Lab 11/09/24 1612  LIPASE 55*   No results for input(s): AMMONIA in the last 168 hours. Coagulation Profile: No results for input(s): INR, PROTIME in the last 168 hours. Cardiac Enzymes: No results for input(s): CKTOTAL, CKMB, CKMBINDEX, TROPONINI in the last 168 hours. BNP (last 3 results) No results for input(s): PROBNP in the last 8760 hours. HbA1C: No results for input(s): HGBA1C in the last 72 hours. CBG: No results for input(s): GLUCAP in the last 168 hours. Lipid Profile: No results for input(s): CHOL, HDL, LDLCALC, TRIG, CHOLHDL, LDLDIRECT in the last 72 hours. Thyroid  Function Tests: No results for input(s): TSH, T4TOTAL, FREET4, T3FREE, THYROIDAB in the last 72 hours.  Anemia Panel: No results for input(s): VITAMINB12, FOLATE, FERRITIN, TIBC, IRON, RETICCTPCT in the last 72 hours. Sepsis Labs: No results for input(s): PROCALCITON, LATICACIDVEN in the last 168 hours.  No results found for this or any previous visit (from the past 240 hours).    Radiology Studies: No results found.     Scheduled Meds:  loratadine   10 mg Oral Daily   Continuous Infusions:     LOS: 4 days   Time spent: 25 minutes   Delon Hoe, DO [facility] 11/13/2024, 12:56 PM   Available via Epic secure chat 7am-7pm After these hours, please refer to coverage provider listed on [email or web address]  "

## 2024-11-14 DIAGNOSIS — G9589 Other specified diseases of spinal cord: Secondary | ICD-10-CM

## 2024-11-14 DIAGNOSIS — R1115 Cyclical vomiting syndrome unrelated to migraine: Secondary | ICD-10-CM | POA: Diagnosis not present

## 2024-11-14 LAB — BASIC METABOLIC PANEL WITH GFR
Anion gap: 13 (ref 5–15)
BUN: 9 mg/dL (ref 6–20)
CO2: 21 mmol/L — ABNORMAL LOW (ref 22–32)
Calcium: 8.7 mg/dL — ABNORMAL LOW (ref 8.9–10.3)
Chloride: 102 mmol/L (ref 98–111)
Creatinine, Ser: 0.61 mg/dL (ref 0.44–1.00)
GFR, Estimated: >60 mL/min
Glucose, Bld: 105 mg/dL — ABNORMAL HIGH (ref 70–99)
Potassium: 3.8 mmol/L (ref 3.5–5.1)
Sodium: 137 mmol/L (ref 135–145)

## 2024-11-14 LAB — VITAMIN B6

## 2024-11-14 LAB — MAGNESIUM: Magnesium: 1.6 mg/dL — ABNORMAL LOW (ref 1.7–2.4)

## 2024-11-14 MED ORDER — MAGNESIUM SULFATE 2 GM/50ML IV SOLN
2.0000 g | Freq: Once | INTRAVENOUS | Status: AC
  Administered 2024-11-14: 2 g via INTRAVENOUS
  Filled 2024-11-14: qty 50

## 2024-11-14 MED ORDER — VITAMIN D (ERGOCALCIFEROL) 1.25 MG (50000 UNIT) PO CAPS
50000.0000 [IU] | ORAL_CAPSULE | ORAL | Status: DC
  Administered 2024-11-14: 50000 [IU] via ORAL
  Filled 2024-11-14: qty 1

## 2024-11-14 MED ORDER — SENNOSIDES-DOCUSATE SODIUM 8.6-50 MG PO TABS: 1.0000 | ORAL_TABLET | Freq: Two times a day (BID) | ORAL | Status: DC | PRN

## 2024-11-14 NOTE — TOC Transition Note (Signed)
 Transition of Care [facility]) - Discharge Note   Patient Details  Name: Julie Ward MRN: [id number] Date of Birth: Apr 29, 1996  [facility] CM/SW Contact:  Bascom Service, RN Phone Number: 11/14/2024, 12:19 PM   Clinical Narrative:  d/c home w/HHC/DME-no preference-[employer] rep accepted for HHPT;[employer] rep Jermaine for rollator to be delivered to rm prior d/c. Has own transport home.     Final next level of care: Home w Home Health Services Barriers to Discharge: No Barriers Identified   Patient Goals and CMS Choice Patient states their goals for this hospitalization and ongoing recovery are:: Home CMS Medicare.gov Compare Post Acute Care list provided to:: Patient Choice offered to / list presented to : Patient [place] ownership interest in [facility].provided to:: Patient    Discharge Placement                       Discharge Plan and Services Additional resources added to the After Visit Summary for     Discharge Planning Services: CM Consult Post Acute Care Choice: Home Health          DME Arranged: Walker rolling with seat DME Agency: [employer] Date DME Agency Contacted: 11/14/24 Time DME Agency Contacted: 1219 Representative spoke with at DME Agency: [place] HH Arranged: PT HH Agency: [facility] Date HH Agency Contacted: 11/14/24 Time HH Agency Contacted: 1219 Representative spoke with at [facility] Agency: [place]  Social Drivers of Health (SDOH) Interventions SDOH Screenings   Food Insecurity: No Food Insecurity (11/09/2024)  Housing: Low Risk (11/09/2024)  Transportation Needs: No Transportation Needs (11/09/2024)  Utilities: Not At Risk (11/09/2024)  Depression (PHQ2-9): Medium Risk (09/21/2023)  Social Connections: Unknown (03/26/2022)   Received from [facility]  Tobacco Use: Low Risk (11/09/2024)     Readmission Risk Interventions    11/12/2024   10:30 AM  Readmission Risk Prevention  Plan  Post Dischage Appt Complete  Medication Screening Complete  Transportation Screening Complete

## 2024-11-14 NOTE — Progress Notes (Signed)
 " PROGRESS NOTE    [name]  FMW:[id number] DOB: Aug 24, 1996 DOA: 11/09/2024 PCP: Johnny Garnette LABOR, MD    Brief Narrative:   Julie Ward is a 28 y.o. female with past medical history significant for Crohn's disease, idiopathic intracranial hypertension who presented to [facility] ED on 11/09/2024 by direction of her gastroenterologist, Dr. Avram for complaints of nausea and vomiting.  Ongoing for months.  Denies any preceding nausea.  Reports throws up around 30-40 minutes after consuming a drink or meal.  Several antiemetics have been tried but states Zofran  works best for her.  Currently taking 4 mg of Zofran  30 minutes prior to meals 3 times daily but still reports emesis 3 times a day.  Reglan  did not help and many of the other antiemetics has given her side effects.  Patient also endorses low-level chronic abdominal pain which is unchanged during this timeframe as well.  Endorses proximal 30 pound weight loss in the last 2 months due to her symptoms.  Patient reports high anxiety due to her symptoms that has not changed over the last several months.  In the ED, temperature 90.8 F, HR 97, RR 20, BP 142/93, SpO2 100% on room air.  WBC 5.2, hemoglobin 14.8, platelet count 3 3.  Sodium 138, potassium 2.8, chloride 92, CO2 28, glucose 112, BUN less than 5, creatinine 0.71.  Lipase 55, AST 37, ALT 49, total bilirubin 1.0.  hCG negative.  Urinalysis unrevealing.  CT chest/abdomen/pelvis with contrast with no acute findings, status post left thyroidectomy with heterogeneous enlargement of the residual right thyroid  gland, nonspecific (recommend dedicated thyroid  sonography), moderate hepatic steatosis, s/p cholecystectomy and appendectomy.  GI was consulted.  TRH consulted for admission for further evaluation and management of intractable nausea/vomiting, electrolyte disturbance.  Assessment & Plan:    Ascending weakness/paresthesias Patient reports decreased sensation to  light touch that has been progressing proximally over the last 3 weeks.  Initially starting in her lower extremities now progressed to her upper torso as well as posterior surface of her proximal upper extremities.  Also reporting issues with her vision; although she does wear glasses at baseline.  Noting possible contributing factors as electrolyte disturbances with hypokalemia/hypomagnesia him, vitamin D deficiency. -- Neurology consult: Pending -- Vitamin B1, vitamin B6: Pending -- Continue PT/OT efforts, currently recommending home health with rollator  Intractable nausea/vomiting Underwent EGD on 10/05/2024 with no significant findings.  Colonoscopy on 10/19/2024 -- Zofran  4 mg p.o. every 8 hours as needed nausea/vomiting  Hypokalemia Potassium 2.8 on admission, likely secondary to GI loss from nausea/vomiting.  Repleted. -- Potassium 3.8 this morning --Continue monitor electrolytes closely  Hypomagnesemia Magnesium  1.6, will replete. -- Repeat electrolytes in a.m.  Vitamin D deficiency, severe Vitamin D 25-hydroxy level less than 6.0. -- Start ergocalciferol 50,000 units PO weekly x 8 weeks -- Will need to follow vitamin D levels outpatient.  Crohn's disease; questionable diagnosis per GI Follows with gastroenterology outpatient, Dr. Avram.  Colonoscopy 10/19/2024 with findings of patchy area mild erythematous mucosa entire colon otherwise unrevealing s/p biopsy concerning for focal active colitis with crypt abscesses.  Seen by GI, Dr. Avram and Dr. San.  Dr. Avram not confident that the colitis found at recent colonoscopy is Crohn's disease and recommends against restarting budesonide  or other treatment at this time. -- Has follow-up with Dr. Avram scheduled on November 27, 2024  History of idiopathic intracranial hypertension Follows with [facility], Dr. Vear.  Recent CT head without contrast 11/05/2024 with  no acute intracranial normality. --  Continue outpatient follow-up with neurology  History of thyroidectomy Heterogeneous enlargement of residual right thyroid  gland, nonspecific found incidentally on CT scan.  TSH 3.550, within normal limits.  Follow-up with outpatient thyroid  ultrasound.  Obesity, class I Body mass index is 31.01 kg/m.    DVT prophylaxis: SCDs Start: 11/09/24 1814    Code Status: Full Code Family Communication: Updated multiple family was present at bedside this morning  Disposition Plan:  Level of care: Med-Surg Status is: Inpatient Remains inpatient appropriate because: Neurology evaluation    Consultants:   Time gastroenterology, Dr. Avram, Dr. San Neurology  Procedures:  None  Antimicrobials:  None   Subjective: Patient seen examined bedside, lying in bed.  Multiple family members present. Ashley RN present at bedside.  Patient continues to complain of weakness, paresthesias to light touch to the level of her breast and scapula posteriorly.  Also complaining of decreased sensation to bilateral upper extremities localized to the proximal posterior segments.  Upset that no 1 is taking her complaints seriously.  Patient concerned that she has Guillain-Barr syndrome.  Discussed with her that she could follow-up with her neurologist, Dr. Vear; but she reports called for a follow-up appointment and [facility] office stated that she would need a new referral as this was not related to her IIH.  Discussed will consult neurology for further evaluation and recommendations; although patient not experiencing any dyspnea/shortness of breath and physical exam not wholly consistent with stated diagnosis of concern.  Reports nausea is currently controlled with Zofran , and she is not concerned with her GI symptoms.  No other questions or concerns at this time.  Denies headache, no chest pain, no shortness of breath, no abdominal pain, no fever/chills/night sweats, no vomiting/diarrhea, no  cough/congestion.  No acute events overnight per nursing staff.   Objective: Vitals:   11/13/24 0620 11/13/24 1322 11/13/24 2031 11/14/24 0555  BP: (!) 139/91 135/85 (!) 142/97 131/81  Pulse: 94 (!) 102 (!) 104 (!) 104  Resp: 18 16 18 18   Temp: 98 F (36.7 C) 97.6 F (36.4 C) 97.7 F (36.5 C) 98.7 F (37.1 C)  TempSrc: Oral Oral Oral Oral  SpO2: 100% 100% 100% 98%  Weight:      Height:        Intake/Output Summary (Last 24 hours) at 11/14/2024 1144 Last data filed at 11/14/2024 1000 Gross per 24 hour  Intake 1560 ml  Output --  Net 1560 ml   Filed Weights   11/09/24 1406  Weight: 89.8 kg    Examination:  Physical Exam: GEN: NAD, alert and oriented x 3, obese HEENT: NCAT, PERRL, EOMI, sclera clear, MMM PULM: CTAB w/o wheezes/crackles, normal respiratory effort on room air with SpO2 98% at rest CV: RRR w/o M/G/R GI: abd soft, NTND, NABS, no R/G/M MSK: no peripheral edema PSYCH: normal mood/affect Integumentary: dry/intact, no rashes or wounds  Neuro Exam Mental Status: A&O x4, no dysarthria, no aphasia, fund of knowledge wnl, concentration and attention were appropriate Cranial Nerves: visual fields full, PERRL, EOMi, intact smooth pursuit, no nystagmus, no ptosis, facial sensation intact bilaterally, 5/5 jaw strength, nasolabial fold & smile symetric,  eyebrow  raise & 5/5 eye closure symetric, hearing symmetric, palate elevates symmetrically, head turning and shoulder shrug intact and symetric bilaterally, tongue protrusion is midline Motor: no pronator drift, LUE 5/5,   LLE 5/5,   RUE 4+/5,   RLE 4+/5   R. patellar 2+     R. achilles 2+  L. patellar 2+     L. achilles 2+       normal muscle bulk no significant atrophy, normal tone, no spasticity or rigidity apperciated  Sensory: Sensation is decreased to light touch to bilateral lower extremities extending proximally to level of anterior breast and inferior scapula posteriorly.  Temp, pain, positional sense  intact in all 4 ext Coordination/Movement: no tremor noted, no dysmetria  Data Reviewed: I have personally reviewed following labs and imaging studies  CBC: Recent Labs  Lab 11/09/24 1612 11/10/24 0501 11/12/24 0459  WBC 5.2 4.8 5.0  HGB 14.8 11.2* 10.6*  HCT 43.0 32.8* 31.9*  MCV 86.7 86.8 90.1  PLT 383 290 252   Basic Metabolic Panel: Recent Labs  Lab 11/09/24 1612 11/10/24 0501 11/11/24 0734 11/12/24 0459 11/13/24 0511 11/14/24 0548  NA 138 135 137 138 138 137  K 2.8* 2.6* 3.2* 3.5 3.6 3.8  CL 92* 96* 100 106 103 102  CO2 28 26 25 24 22  21*  GLUCOSE 112* 117* 122* 123* 114* 105*  BUN <5* <5* <5* <5* <5* 9  CREATININE 0.71 0.61 0.58 0.60 0.63 0.61  CALCIUM 10.0 8.8* 8.9 8.6* 8.8* 8.7*  MG 1.9  --  1.7  --  1.7 1.6*   GFR: Estimated Creatinine Clearance: 118.3 mL/min (by C-G formula based on SCr of 0.61 mg/dL). Liver Function Tests: Recent Labs  Lab 11/09/24 1612 11/10/24 0501  AST 37 20  ALT 49* 31  ALKPHOS 74 55  BILITOT 1.0 0.6  PROT 9.6* 6.9  ALBUMIN 4.5 3.3*   Recent Labs  Lab 11/09/24 1612  LIPASE 55*   No results for input(s): AMMONIA in the last 168 hours. Coagulation Profile: No results for input(s): INR, PROTIME in the last 168 hours. Cardiac Enzymes: No results for input(s): CKTOTAL, CKMB, CKMBINDEX, TROPONINI in the last 168 hours. BNP (last 3 results) No results for input(s): PROBNP in the last 8760 hours. HbA1C: No results for input(s): HGBA1C in the last 72 hours. CBG: No results for input(s): GLUCAP in the last 168 hours. Lipid Profile: No results for input(s): CHOL, HDL, LDLCALC, TRIG, CHOLHDL, LDLDIRECT in the last 72 hours. Thyroid  Function Tests: No results for input(s): TSH, T4TOTAL, FREET4, T3FREE, THYROIDAB in the last 72 hours. Anemia Panel: Recent Labs    11/13/24 1410  VITAMINB12 687   Sepsis Labs: No results for input(s): PROCALCITON, LATICACIDVEN in the last 168  hours.  No results found for this or any previous visit (from the past 240 hours).       Radiology Studies: No results found.      Scheduled Meds:  loratadine   10 mg Oral Daily   Continuous Infusions:   LOS: 5 days    Time spent: 52 minutes spent on 11/14/2024 caring for this patient face-to-face including chart review, ordering labs/tests, documenting, discussion with nursing staff, consultants, updating family and interview/physical exam    Camellia PARAS Nickie Warwick, DO [facility] Available via Epic secure chat 7am-7pm After these hours, please refer to coverage provider listed on [email or web address] 11/14/2024, 11:44 AM   "

## 2024-11-14 NOTE — Evaluation (Signed)
 Physical Therapy Evaluation Patient Details Name: Julie Ward MRN: [id number] DOB: [birth year] Today's Date: 11/14/2024  History of Present Illness  Pt is a 28 y/o female presenting with progressive N&V at home. Found to have hypokalemia. Of note, pt reports recent EGD was clear and recent colonoscopy showed colitis. On 12/30, pt reports acute change in sensation of lower extremities and torso, as well as vision changes. Pending further workup. PMH: Crohn's disease, idiopathic intracranial HTN, GERD, hx of L thyroid  lobectomy.  Clinical Impression  Pt admitted with above diagnosis. At baseline pt ambulates without an assistive device and works out 5x/week at [employer]. Pt reports pain in BLEs in worse today than yesterday. She has decreased sensation to light touch from her wait to her feet, and on backs of arms. Pt ambulated 20' with RW without loss of balance but with increased BLE shakiness, distance limited by pain and fatigue. Pt puts forth good effort.  Pt currently with functional limitations due to the deficits listed below (see PT Problem List). Pt will benefit from acute skilled PT to increase their independence and safety with mobility to allow discharge.           If plan is discharge home, recommend the following: A little help with bathing/dressing/bathroom;Assistance with cooking/housework;Assist for transportation;Help with stairs or ramp for entrance   Can travel by private vehicle        Equipment Recommendations Rollator (4 wheels)  Recommendations for Other Services       Functional Status Assessment Patient has had a recent decline in their functional status and demonstrates the ability to make significant improvements in function in a reasonable and predictable amount of time.     Precautions / Restrictions Precautions Precautions: Fall Recall of Precautions/Restrictions: Intact Restrictions Weight Bearing Restrictions Per Provider Order: No      Mobility   Bed Mobility Overal bed mobility: Modified Independent             General bed mobility comments: HOB up, used rails    Transfers Overall transfer level: Needs assistance Equipment used: Rolling walker (2 wheels) Transfers: Sit to/from Stand Sit to Stand: Contact guard assist                Ambulation/Gait Ambulation/Gait assistance: Contact guard assist Gait Distance (Feet): 85 Feet Assistive device: Rolling walker (2 wheels) Gait Pattern/deviations: Decreased stride length, Step-to pattern Gait velocity: decr     General Gait Details: steady, no loss of balance, BLEs tremulous during ambulation, pt reports 8/10 pain BLEs, distance limited by fatigue  Stairs            Wheelchair Mobility     Tilt Bed    Modified Rankin (Stroke Patients Only)       Balance Overall balance assessment: Needs assistance Sitting-balance support: No upper extremity supported, Feet supported Sitting balance-Leahy Scale: Good     Standing balance support: Bilateral upper extremity supported, During functional activity Standing balance-Leahy Scale: Poor Standing balance comment: reliant on BUE for mobility                             Pertinent Vitals/Pain Pain Assessment Pain Assessment: 0-10 Pain Score: 8  Pain Location: BLEs Pain Descriptors / Indicators: Heaviness, Aching Pain Intervention(s): Limited activity within patient's tolerance, Monitored during session, Repositioned    Home Living Family/patient expects to be discharged to:: Private residence Living Arrangements: Alone Available Help at Discharge: Family;Available 24 hours/day Type  of Home: House Home Access: Stairs to enter   [employer] of Steps: 1   Home Layout: One level Home Equipment: None      Prior Function Prior Level of Function : Independent/Modified Independent;Working/employed;Driving             Mobility Comments: no AD, goes to gym 5x/week ADLs  Comments: 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 very independent     Extremity/Trunk Assessment   Upper Extremity Assessment Upper Extremity Assessment: Defer to OT evaluation RUE Deficits / Details: strength 4+/5, coordination appears [facility] but pt does report feeling more clumsy when reaching for things, denies dropping items RUE Sensation: decreased light touch LUE Deficits / Details: strength 4+/5, coordination appears [facility] but pt does report feeling more clumsy when reaching for things, denies dropping items. reports sensation changes (pins/needles) under elbow area LUE Sensation: decreased light touch    Lower Extremity Assessment Lower Extremity Assessment: RLE deficits/detail;LLE deficits/detail RLE Deficits / Details: decreased sensation to light touch from foot to thigh (more pronounced on R than left at thigh); strength +4/5 for hip flexion, knee ext, ankle PF/DF RLE Sensation: decreased light touch LLE Deficits / Details: decreased sensation to light touch from foot to thigh; strength +4/5 for hip flexion, knee ext, ankle PF/DF LLE Sensation: decreased light touch    Cervical / Trunk Assessment Cervical / Trunk Assessment: Other exceptions;Normal Cervical / Trunk Exceptions: reports pins & needles/some numbness in trunk  Communication   Communication Communication: No apparent difficulties    Cognition Arousal: Alert Behavior During Therapy: WFL for tasks assessed/performed                             Following commands: Intact       Cueing Cueing Techniques: Verbal cues, Gestural cues     General Comments      Exercises     Assessment/Plan    PT Assessment Patient needs continued PT services  PT Problem List Decreased balance;Decreased activity tolerance;Impaired sensation;Decreased mobility       PT Treatment Interventions Gait training;Therapeutic activities;Therapeutic exercise;Functional mobility training;Patient/family  education;DME instruction    PT Goals (Current goals can be found in the Care Plan section)  Acute Rehab PT Goals Patient Stated Goal: likes to work out at gym 5x/week PT Goal Formulation: With patient/family Time For Goal Achievement: 11/28/24 Potential to Achieve Goals: Good    Frequency Min 3X/week     Co-evaluation               AM-PAC PT 6 Clicks Mobility  Outcome Measure Help needed turning from your back to your side while in a flat bed without using bedrails?: None Help needed moving from lying on your back to sitting on the side of a flat bed without using bedrails?: A Little Help needed moving to and from a bed to a chair (including a wheelchair)?: None Help needed standing up from a chair using your arms (e.g., wheelchair or bedside chair)?: None Help needed to walk in hospital room?: None Help needed climbing 3-5 steps with a railing? : A Little 6 Click Score: 22    End of Session Equipment Utilized During Treatment: Gait belt Activity Tolerance: Patient limited by pain;Patient limited by fatigue Patient left: in bed;with family/visitor present;with call bell/phone within reach Nurse Communication: Mobility status PT Visit Diagnosis: Unsteadiness on feet (R26.81);Difficulty in walking, not elsewhere classified (R26.2);Pain;Other symptoms and signs involving the nervous system (R29.898) Pain -  Right/Left: Right Pain - part of body: Leg    Time: 8942-8888 PT Time Calculation (min) (ACUTE ONLY): 14 min   Charges:   PT Evaluation $PT Eval Moderate Complexity: 1 Mod   PT General Charges $$ ACUTE PT VISIT: 1 Visit         Sylvan Delon Copp PT 11/14/2024  Acute Rehabilitation Services  Office [phone]

## 2024-11-14 NOTE — Consult Note (Signed)
 NEUROLOGY CONSULT NOTE   Date of service: November 14, 2024 Patient Name: Julie Ward MRN:  [id number] DOB:  01/12/1996 Chief Complaint: Tingling of the arms and legs Requesting Provider: Austria, Eric J, DO  History of Present Illness  Julie Ward is a 28 y.o. female with hx of possible Crohn's disease who has been having nausea vomiting for the past few weeks.  She reports that 3 weeks ago she started having paresthesias over her legs, predominantly over her thighs and upper legs, not as much on her feet.  This has increased in intensity and has begun to involve the inferior aspects of her arms, not circumferentially.  She states that she has been having trouble walking and unsteadiness and feels subjectively like she has been weaker.  Past History   Past Medical History:  Diagnosis Date   Abdominal pain, recurrent    Allergy    Anxiety    Appendicitis    Colitis    Crohn's disease of colon (HCC) 08/15/2013   sees Dr. Vina Rattler at [facility]    Dysmenorrhea    GERD (gastroesophageal reflux disease)    not taking any medication   IIH (idiopathic intracranial hypertension)    I have been cleared of that since July 2025   Shortness of breath dyspnea    ith thyroid  problem    Past Surgical History:  Procedure Laterality Date   APPENDECTOMY  07-14-11   laparoscopic    CHOLECYSTECTOMY  02/11/2012   Procedure: LAPAROSCOPIC CHOLECYSTECTOMY WITH INTRAOPERATIVE CHOLANGIOGRAM;  Surgeon: Lynwood MALVA Pina, MD;  Location: [facility] OR;  Service: General;  Laterality: N/A;   ESOPHAGOGASTRODUODENOSCOPY  01/28/2012   Procedure: ESOPHAGOGASTRODUODENOSCOPY (EGD);  Surgeon: Fairy VEAR Gaskins, MD;  Location: [facility] OR;  Service: Gastroenterology;  Laterality: N/A;   THYROID  LOBECTOMY Left 04/29/2015   Procedure: LEFT TOTAL THYROID  LOBECTOMY;  Surgeon: Lynwood Pina, MD;  Location: [facility] OR;  Service: General;  Laterality: Left;   WISDOM TOOTH EXTRACTION      Family History: Family History  Problem  Relation Age of Onset   Cancer Paternal Aunt        ovarian, kidney   Cancer Paternal Uncle        colon   Cancer Maternal Grandmother        breast    Hypertension Maternal Grandmother    Irritable bowel syndrome Maternal Grandmother    Heart disease Maternal Grandmother    Hypertension Paternal Grandmother    Hyperlipidemia Paternal Grandmother    Colon cancer Paternal Grandfather    Esophageal cancer Neg Hx    Rectal cancer Neg Hx    Stomach cancer Neg Hx     Social History  reports that she has never smoked. She has never used smokeless tobacco. She reports current alcohol use. She reports that she does not use drugs.  Allergies[1]  Medications  Current Medications[2]  Vitals   Vitals:   11/13/24 1322 11/13/24 2031 11/14/24 0555 11/14/24 1345  BP: 135/85 (!) 142/97 131/81 (!) 141/93  Pulse: (!) 102 (!) 104 (!) 104 100  Resp: 16 18 18 16   Temp: 97.6 F (36.4 C) 97.7 F (36.5 C) 98.7 F (37.1 C) (!) 97.5 F (36.4 C)  TempSrc: Oral Oral Oral Oral  SpO2: 100% 100% 98% 100%  Weight:      Height:        Body mass index is 31.01 kg/m.   Physical Exam   Constitutional: Appears well-developed and well-nourished.   Neurologic Examination  Neuro: Mental Status: Patient is awake, alert, oriented to person, place, month, year, and situation. Patient is able to give a clear and coherent history. No signs of aphasia or neglect Cranial Nerves: II: Visual Fields are full. Pupils are equal, round, and reactive to light.   III,IV, VI: EOMI without ptosis or diploplia.  V: Facial sensation is symmetric to temperature VII: Facial movement is symmetric.  VIII: hearing is intact to voice X: Uvula elevates symmetrically XII: tongue is midline without atrophy or fasciculations.  Motor: She has good strength to confrontational muscle testing with the possible exception of hip flexion and knee flexion which would be 4+. Sensory: She has diminished sensation to pin and  light touch over her thighs and anterior legs, as well as over the dorsal aspect of her upper arms.  I am not able to find a clear sensory level on her back to pinprick. Deep Tendon Reflexes: 2+ and symmetric in the biceps and patellae, and brachial radialis.  She guards when attempting to check her ankle reflexes, so though I am not able to elicit them I am not sure this is a significant finding. Plantars: Toes are downgoing bilaterally.  Cerebellar: FNF intact bilaterally    Labs/Imaging/Neurodiagnostic studies   CBC:  Recent Labs  Lab 11-18-24 0501 11/12/24 0459  WBC 4.8 5.0  HGB 11.2* 10.6*  HCT 32.8* 31.9*  MCV 86.8 90.1  PLT 290 252   Basic Metabolic Panel:  Lab Results  Component Value Date   NA 137 11/14/2024   K 3.8 11/14/2024   CO2 21 (L) 11/14/2024   GLUCOSE 105 (H) 11/14/2024   BUN 9 11/14/2024   CREATININE 0.61 11/14/2024   CALCIUM 8.7 (L) 11/14/2024   GFRNONAA >60 11/14/2024   GFRAA >60 04/17/2019   INR  Lab Results  Component Value Date   INR 1.21 10/05/2014   APTT  Lab Results  Component Value Date   APTT 34 10/05/2014    CT Head without contrast(Personally reviewed): negative  ASSESSMENT   Julie Ward is a 28 y.o. female with a history of IIH, though no recent symptoms, who presents with bilateral symmetric paresthesias that have been progressing in both intensity and distribution.  With intact reflexes, I do not really have any concern for guillian-barre syndrome, but I am concerned for myelopathy.  With the distribution of symptoms, syrinx could be a consideration, as could metabolic myelopathy, or demyelinating lesion.  I will get both brain and C-spine MRIs to assess for this.  RECOMMENDATIONS  MRI brain and C-spine with and without contrast Serum copper, vitamin E Neurology will follow  ______________________________________________________________________    Signed, Aisha Seals, MD [facility]    [1]   Allergies Allergen Reactions   Penicillins Shortness Of Breath, Nausea Only and Other (See Comments)    Made her sick/Childhood Allergy Has patient had a PCN reaction causing immediate rash, facial/tongue/throat swelling, SOB or lightheadedness with hypotension:yes Has patient had a PCN reaction causing severe rash involving mucus membranes or skin necrosis: No Has patient had a PCN reaction that required hospitalization: No Has patient had a PCN reaction occurring within the last 10 years:No .   Prednisone Hives   Fentanyl  Anxiety   Hydrocodone  Nausea And Vomiting   Metoclopramide  Other (See Comments)    Sweaty, felt unwell   Morphine  And Codeine Nausea And Vomiting and Other (See Comments)    Caused stomach to burn    Oxycontin  [Oxycodone  Hcl] Nausea And Vomiting   Valium  Erycius.fate ]  Other (See Comments)    Hyperactivity   [2]  Current Facility-Administered Medications:    acetaminophen  (TYLENOL ) tablet 650 mg, 650 mg, Oral, Q6H PRN, 650 mg at 11/13/24 2028 **OR** acetaminophen  (TYLENOL ) suppository 650 mg, 650 mg, Rectal, Q6H PRN, Claiborne, Claudia, MD   bisacodyl  (DULCOLAX) EC tablet 5 mg, 5 mg, Oral, Daily PRN, Claiborne, Claudia, MD, 5 mg at 11/14/24 1307   loratadine  (CLARITIN ) tablet 10 mg, 10 mg, Oral, Daily, Claiborne, Claudia, MD, 10 mg at 11/14/24 9082   ondansetron  (ZOFRAN ) tablet 4 mg, 4 mg, Oral, Q8H PRN, Cirigliano, Vito V, DO   senna-docusate (Senokot-S) tablet 1 tablet, 1 tablet, Oral, BID PRN, Austria, Eric J, DO   Vitamin D (Ergocalciferol) (DRISDOL) 1.25 MG (50000 UNIT) capsule 50,000 Units, 50,000 Units, Oral, Q7 days, Austria, Eric J, DO, 50,000 Units at 11/14/24 1248

## 2024-11-14 NOTE — Progress Notes (Signed)
 Patient states, from legs up to back, breast, and arms feel heavy feels worse than yesterday. Its pain and pressure sensation.  Patient tearful because I don't feel there is any sense of urgency for what's going on with me. Support provided.

## 2024-11-14 NOTE — Plan of Care (Signed)
   Problem: Clinical Measurements: Goal: Diagnostic test results will improve Outcome: Progressing   Problem: Activity: Goal: Risk for activity intolerance will decrease Outcome: Progressing   Problem: Nutrition: Goal: Adequate nutrition will be maintained Outcome: Progressing

## 2024-11-15 ENCOUNTER — Other Ambulatory Visit: Payer: Self-pay | Admitting: Internal Medicine

## 2024-11-15 ENCOUNTER — Inpatient Hospital Stay (HOSPITAL_COMMUNITY)

## 2024-11-15 DIAGNOSIS — R1115 Cyclical vomiting syndrome unrelated to migraine: Secondary | ICD-10-CM | POA: Diagnosis not present

## 2024-11-15 LAB — BASIC METABOLIC PANEL WITH GFR
Anion gap: 12 (ref 5–15)
BUN: <5 mg/dL — ABNORMAL LOW (ref 6–20)
CO2: 22 mmol/L (ref 22–32)
Calcium: 9.1 mg/dL (ref 8.9–10.3)
Chloride: 104 mmol/L (ref 98–111)
Creatinine, Ser: 0.61 mg/dL (ref 0.44–1.00)
GFR, Estimated: >60 mL/min
Glucose, Bld: 98 mg/dL (ref 70–99)
Potassium: 3.9 mmol/L (ref 3.5–5.1)
Sodium: 138 mmol/L (ref 135–145)

## 2024-11-15 LAB — PHOSPHORUS: Phosphorus: 5.1 mg/dL — ABNORMAL HIGH (ref 2.5–4.6)

## 2024-11-15 LAB — MAGNESIUM: Magnesium: 2.2 mg/dL (ref 1.7–2.4)

## 2024-11-15 MED ORDER — GADOBUTROL 1 MMOL/ML IV SOLN
9.0000 mL | Freq: Once | INTRAVENOUS | Status: AC | PRN
  Administered 2024-11-15: 9 mL via INTRAVENOUS

## 2024-11-15 NOTE — Progress Notes (Signed)
 Occupational Therapy Treatment Patient Details Name: Julie Ward MRN: [id number] DOB: 03-Oct-1996 Today's Date: 11/15/2024   History of present illness Pt is a 29 y/o female presenting with progressive N&V at home. Found to have hypokalemia. Of note, pt reports recent EGD was clear and recent colonoscopy showed colitis. On 12/30, pt reports acute change in sensation of lower extremities and torso, as well as vision changes. Pending further workup. PMH: Crohn's disease, idiopathic intracranial HTN, GERD, hx of L thyroid  lobectomy.   OT comments  Pt  in bed upon therapy arrival with family members visiting at bedside. Pt requesting to work on LE strengthening. Session focused on LE HEP utilizing green theraband in order to increase functional performance during LB ADL's. Pt provided with verbal instructions and visual demonstration for proper form and technique. Provided handout for reference. DME recommendation provided for shower to increase safety d/t LE weakness. Recommending HHOT at discharge when appropriate.      If plan is discharge home, recommend the following:  A little help with walking and/or transfers;A little help with bathing/dressing/bathroom;Assistance with cooking/housework;Help with stairs or ramp for entrance;Assist for transportation   Equipment Recommendations  Tub/shower seat       Precautions / Restrictions Precautions Precautions: Fall Recall of Precautions/Restrictions: Intact Restrictions Weight Bearing Restrictions Per Provider Order: No       Mobility Bed Mobility Overal bed mobility: Modified Independent   Transfers Overall transfer level: Needs assistance Equipment used: Rolling walker (2 wheels) Transfers: Bed to chair/wheelchair/BSC, Sit to/from Stand Sit to Stand: Supervision     Step pivot transfers: Supervision           Balance Overall balance assessment: Needs assistance Sitting-balance support: No upper extremity supported, Feet  supported Sitting balance-Leahy Scale: Good     Standing balance support: Bilateral upper extremity supported, During functional activity Standing balance-Leahy Scale: Poor Standing balance comment: reliant on external support for balance          ADL either performed or assessed with clinical judgement   ADL    General ADL Comments: recommended use of shower chair upon returning home for bathing for safety. Provided online resource for purchase.              Communication Communication Communication: No apparent difficulties   Cognition Arousal: Alert Behavior During Therapy: WFL for tasks assessed/performed Cognition: No apparent impairments          Following commands: Intact        Cueing   Cueing Techniques: Verbal cues  Exercises Completed the following exercises for LE HEP:    Access Code: [email or web address] URL: [email or web address] Date: 11/15/2024 Prepared by: Leita Howell  Exercises - Seated Hip Abduction with Resistance (green tband)  - 1 x daily - 7 x weekly - 1 sets - 10-15 reps - 2-3 hold - Seated Knee Extension with Resistance (green tband) - 1 x daily - 7 x weekly - 1 sets - 10-15 reps - 2-3 hold - Squat with Chair Touch  - 1 x daily - 7 x weekly - 1 sets - 10-15 reps - Standing Heel Raises  - 1 x daily - 7 x weekly - 1 sets - 10-15 reps            Pertinent Vitals/ Pain       Pain Assessment Pain Assessment: Faces Faces Pain Scale: No hurt         Frequency  Min 2X/week        Progress Toward Goals  OT Goals(current goals can now be found in the care plan section)  Progress towards OT goals: Progressing toward goals            AM-PAC OT 6 Clicks Daily Activity     Outcome Measure   Help from another person eating meals?: None Help from another person taking care of personal grooming?: None Help from another person toileting, which includes using toliet, bedpan, or urinal?: None Help from another person  bathing (including washing, rinsing, drying)?: None Help from another person to put on and taking off regular upper body clothing?: None Help from another person to put on and taking off regular lower body clothing?: None 6 Click Score: 24    End of Session Equipment Utilized During Treatment: Gait belt;Rolling walker (2 wheels)  OT Visit Diagnosis: Unsteadiness on feet (R26.81);Other abnormalities of gait and mobility (R26.89);Other (comment)   Activity Tolerance Patient tolerated treatment well   Patient Left in chair;with call bell/phone within reach;with family/visitor present           Time: 8549-8475 OT Time Calculation (min): 34 min  Charges: OT General Charges $OT Visit: 1 Visit OT Treatments $Therapeutic Activity: 8-22 mins $Therapeutic Exercise: 8-22 mins  Leita Howell, OTR/L,CBIS  Supplemental OT - [facility] and [facility] Secure Chat Preferred    Trayton Szabo, Leita BIRCH 11/15/2024, 4:31 PM

## 2024-11-15 NOTE — Progress Notes (Signed)
 " PROGRESS NOTE    Julie Ward  FMW:[id number] DOB: 11-Apr-1996 DOA: 11/09/2024 PCP: Johnny Garnette LABOR, MD    Brief Narrative:   Julie Ward is a 29 y.o. female with past medical history significant for Crohn's disease, idiopathic intracranial hypertension who presented to [facility] ED on 11/09/2024 by direction of her gastroenterologist, Dr. Avram for complaints of nausea and vomiting.  Ongoing for months.  Denies any preceding nausea.  Reports throws up around 30-40 minutes after consuming a drink or meal.  Several antiemetics have been tried but states Zofran  works best for her.  Currently taking 4 mg of Zofran  30 minutes prior to meals 3 times daily but still reports emesis 3 times a day.  Reglan  did not help and many of the other antiemetics has given her side effects.  Patient also endorses low-level chronic abdominal pain which is unchanged during this timeframe as well.  Endorses proximal 30 pound weight loss in the last 2 months due to her symptoms.  Patient reports high anxiety due to her symptoms that has not changed over the last several months.  In the ED, temperature 90.8 F, HR 97, RR 20, BP 142/93, SpO2 100% on room air.  WBC 5.2, hemoglobin 14.8, platelet count 3 3.  Sodium 138, potassium 2.8, chloride 92, CO2 28, glucose 112, BUN less than 5, creatinine 0.71.  Lipase 55, AST 37, ALT 49, total bilirubin 1.0.  hCG negative.  Urinalysis unrevealing.  CT chest/abdomen/pelvis with contrast with no acute findings, status post left thyroidectomy with heterogeneous enlargement of the residual right thyroid  gland, nonspecific (recommend dedicated thyroid  sonography), moderate hepatic steatosis, s/p cholecystectomy and appendectomy.  GI was consulted.  TRH consulted for admission for further evaluation and management of intractable nausea/vomiting, electrolyte disturbance.  Assessment & Plan:    Ascending weakness/paresthesias Patient reports decreased sensation to  light touch that has been progressing proximally over the last 3 weeks.  Initially starting in her lower extremities now progressed to her upper torso as well as posterior surface of her proximal upper extremities.  Also reporting issues with her vision; although she does wear glasses at baseline.  Noting possible contributing factors as electrolyte disturbances with hypokalemia/hypomagnesia him, vitamin D deficiency. MRI brain with/without contrast with no acute intracranial Sharol, small developmental venous abnormality right cerebellum, cerebellar tonsillar ectopia with cerebellar tonsils extend below the foramen magnum approximately 4 mm without significant crowding of the foramen magnum.  MR C-spine with and without contrast with no cervical cord lesions or abnormal enhancement, no significant spinal canal or foraminal stenosis, enlargement of the partially visualized right thyroid  lobe with multiple thyroid  nodules resulting in mild leftward deviation of the extrathoracic trachea. -- Neurology following, appreciate assistance -- Vitamin B1, vitamin E, copper level: Pending -- Continue PT/OT efforts, currently recommending home health with rollator  Intractable nausea/vomiting Underwent EGD on 10/05/2024 with no significant findings.  Colonoscopy on 10/19/2024 -- Zofran  4 mg p.o. every 8 hours as needed nausea/vomiting  Hypokalemia Potassium 2.8 on admission, likely secondary to GI loss from nausea/vomiting.  Repleted. -- Potassium 3.8 this morning --Continue monitor electrolytes closely  Hypomagnesemia Magnesium  1.6, will replete. -- Repeat electrolytes in a.m.  Vitamin D deficiency, severe Vitamin D 25-hydroxy level less than 6.0. -- Start ergocalciferol 50,000 units PO weekly x 8 weeks -- Will need to follow vitamin D levels outpatient.  Crohn's disease; questionable diagnosis per GI Follows with gastroenterology outpatient, Dr. Avram.  Colonoscopy 10/19/2024 with findings of patchy  area mild  erythematous mucosa entire colon otherwise unrevealing s/p biopsy concerning for focal active colitis with crypt abscesses.  Seen by GI, Dr. Avram and Dr. San.  Dr. Avram not confident that the colitis found at recent colonoscopy is Crohn's disease and recommends against restarting budesonide  or other treatment at this time. -- Has follow-up with Dr. Avram scheduled on November 27, 2024  History of idiopathic intracranial hypertension Follows with [facility], Dr. Vear.  Recent CT head without contrast 11/05/2024 with no acute intracranial normality. -- Continue outpatient follow-up with neurology  History of thyroidectomy Heterogeneous enlargement of residual right thyroid  gland, nonspecific found incidentally on CT scan.  MR C-spine with and without contrast with enlargement of the partially visualized right thyroid  lobe with multiple thyroid  nodules resulting in mild leftward deviation of the extrathoracic trachea.  TSH 3.550, within normal limits.  Follow-up with nonemergent outpatient thyroid  ultrasound.  Obesity, class I Body mass index is 31.01 kg/m.    DVT prophylaxis: SCDs Start: 11/09/24 1814    Code Status: Full Code Family Communication: Updated multiple family was present at bedside this morning  Disposition Plan:  Level of care: Med-Surg Status is: Inpatient Remains inpatient appropriate because: Neurology evaluation    Consultants:   [facility], Dr. Avram, Dr. San Neurology  Procedures:  None  Antimicrobials:  None   Subjective: Patient seen examined bedside, lying in bed.  Multiple family members present.  Just returned from MRI.  No significant change  (worsening/improvement) of weakness/decreased sensation since yesterday.  Nausea remains controlled with Zofran .  No other questions or concerns at this time.  Denies headache, no chest pain, no shortness of breath, no abdominal pain, no  fever/chills/night sweats, no vomiting/diarrhea, no cough/congestion.  No acute events overnight per nursing staff.  Await further neurology recommendations.   Objective: Vitals:   11/14/24 1345 11/14/24 2204 11/15/24 0707 11/15/24 1356  BP: (!) 141/93 (!) 152/102 130/85 (!) 126/96  Pulse: 100 (!) 107 93 99  Resp: 16 18 17 17   Temp: (!) 97.5 F (36.4 C) 98.2 F (36.8 C) 97.8 F (36.6 C) 98 F (36.7 C)  TempSrc: Oral Oral Oral   SpO2: 100% 99% 99% 100%  Weight:      Height:        Intake/Output Summary (Last 24 hours) at 11/15/2024 1425 Last data filed at 11/15/2024 0200 Gross per 24 hour  Intake 960 ml  Output --  Net 960 ml   Filed Weights   11/09/24 1406  Weight: 89.8 kg    Examination:  Physical Exam: GEN: NAD, alert and oriented x 3, obese HEENT: NCAT, PERRL, EOMI, sclera clear, MMM PULM: CTAB w/o wheezes/crackles, normal respiratory effort on room air with SpO2 98% at rest CV: RRR w/o M/G/R GI: abd soft, NTND, NABS, no R/G/M MSK: no peripheral edema PSYCH: normal mood/affect Integumentary: dry/intact, no rashes or wounds  Neuro Exam Mental Status: A&O x4, no dysarthria, no aphasia, fund of knowledge wnl, concentration and attention were appropriate Cranial Nerves: visual fields full, PERRL, EOMi, intact smooth pursuit, no nystagmus, no ptosis, facial sensation intact bilaterally, 5/5 jaw strength, nasolabial fold & smile symetric,  eyebrow  raise & 5/5 eye closure symetric, hearing symmetric, palate elevates symmetrically, head turning and shoulder shrug intact and symetric bilaterally, tongue protrusion is midline Motor: no pronator drift, LUE 5/5,   LLE 5/5,   RUE 4+/5,   RLE 4+/5   R. patellar 2+     R. achilles 2+  L. patellar 2+     L. achilles 2+       normal muscle bulk no significant atrophy, normal tone, no spasticity or rigidity apperciated  Sensory: Sensation is decreased to light touch to bilateral lower extremities extending proximally to level  of anterior breast and inferior scapula posteriorly.  Temp, pain, positional sense intact in all 4 ext Coordination/Movement: no tremor noted, no dysmetria  Data Reviewed: I have personally reviewed following labs and imaging studies  CBC: Recent Labs  Lab 11/09/24 1612 11/10/24 0501 11/12/24 0459  WBC 5.2 4.8 5.0  HGB 14.8 11.2* 10.6*  HCT 43.0 32.8* 31.9*  MCV 86.7 86.8 90.1  PLT 383 290 252   Basic Metabolic Panel: Recent Labs  Lab 11/09/24 1612 11/10/24 0501 11/11/24 0734 11/12/24 0459 11/13/24 0511 11/14/24 0548 11/15/24 0702  NA 138   < > 137 138 138 137 138  K 2.8*   < > 3.2* 3.5 3.6 3.8 3.9  CL 92*   < > 100 106 103 102 104  CO2 28   < > 25 24 22  21* 22  GLUCOSE 112*   < > 122* 123* 114* 105* 98  BUN <5*   < > <5* <5* <5* 9 <5*  CREATININE 0.71   < > 0.58 0.60 0.63 0.61 0.61  CALCIUM 10.0   < > 8.9 8.6* 8.8* 8.7* 9.1  MG 1.9  --  1.7  --  1.7 1.6* 2.2  PHOS  --   --   --   --   --   --  5.1*   < > = values in this interval not displayed.   GFR: Estimated Creatinine Clearance: 118.3 mL/min (by C-G formula based on SCr of 0.61 mg/dL). Liver Function Tests: Recent Labs  Lab 11/09/24 1612 11/10/24 0501  AST 37 20  ALT 49* 31  ALKPHOS 74 55  BILITOT 1.0 0.6  PROT 9.6* 6.9  ALBUMIN 4.5 3.3*   Recent Labs  Lab 11/09/24 1612  LIPASE 55*   No results for input(s): AMMONIA in the last 168 hours. Coagulation Profile: No results for input(s): INR, PROTIME in the last 168 hours. Cardiac Enzymes: No results for input(s): CKTOTAL, CKMB, CKMBINDEX, TROPONINI in the last 168 hours. BNP (last 3 results) No results for input(s): PROBNP in the last 8760 hours. HbA1C: No results for input(s): HGBA1C in the last 72 hours. CBG: No results for input(s): GLUCAP in the last 168 hours. Lipid Profile: No results for input(s): CHOL, HDL, LDLCALC, TRIG, CHOLHDL, LDLDIRECT in the last 72 hours. Thyroid  Function Tests: No results for  input(s): TSH, T4TOTAL, FREET4, T3FREE, THYROIDAB in the last 72 hours. Anemia Panel: Recent Labs    11/13/24 1410  VITAMINB12 687   Sepsis Labs: No results for input(s): PROCALCITON, LATICACIDVEN in the last 168 hours.  No results found for this or any previous visit (from the past 240 hours).       Radiology Studies: MR CERVICAL SPINE W WO CONTRAST Result Date: 11/15/2024 EXAM: MRI CERVICAL SPINE WITH AND WITHOUT CONTRAST 11/15/2024 10:51:00 AM TECHNIQUE: Multiplanar multisequence MRI of the cervical spine was performed without and with the administration of intravenous contrast. CONTRAST: 9 mL of Gadavist. COMPARISON: None available. CLINICAL HISTORY: Myelopathy, acute, cervical spine. FINDINGS: BONES AND ALIGNMENT: Normal alignment. Normal vertebral body heights. Marrow signal is unremarkable. No abnormal enhancement in the cervical spine. SPINAL CORD: Normal spinal cord size. Normal spinal cord signal. There are no cervical cord lesions appreciated. SOFT TISSUES: No paraspinal  mass. There is enlargement of the partially visualized right thyroid  lobe with multiple thyroid  nodules noted. Recommend nonemergent thyroid  ultrasound for further evaluation. Enlargement of the right thyroid  lobe results in mild leftward deviation of the extrathoracic trachea. C2-C3: No significant disc herniation. No significant spinal canal or foraminal stenosis. C3-C4: No significant disc herniation. No significant spinal canal or foraminal stenosis. C4-C5: No significant disc herniation. No significant spinal canal or foraminal stenosis. C5-C6: No significant disc herniation. No significant spinal canal or foraminal stenosis. C6-C7: No significant disc herniation. No significant spinal canal or foraminal stenosis. C7-T1: No significant disc herniation. No significant spinal canal or foraminal stenosis. IMPRESSION: 1. No cervical cord lesions or abnormal enhancement. 2. No significant spinal canal or  foraminal stenosis at any level. 3. Enlargement of the partially visualized right thyroid  lobe with multiple thyroid  nodules resulting in mild leftward deviation of the extrathoracic trachea, recommend nonemergent thyroid  ultrasound for further evaluation. Electronically signed by: Donnice Mania MD 11/15/2024 01:45 PM EST RP Workstation: [id number]   MR BRAIN W WO CONTRAST Result Date: 11/15/2024 EXAM: MRI BRAIN WITH AND WITHOUT CONTRAST 11/15/2024 10:51:00 AM TECHNIQUE: Multiplanar multisequence MRI of the head/brain was performed with and without the administration of intravenous contrast. CONTRAST: 9 mL of Gadavist. COMPARISON: None available. CLINICAL HISTORY: Multiple sclerosis (MS); lower extremity weakness. FINDINGS: BRAIN AND VENTRICLES: No acute infarct. No acute intracranial hemorrhage. No mass effect or midline shift. No hydrocephalus. Cerebellar tonsillar ectopia with the cerebellar tonsils extending below the foramen magnum by approximately 4 mm; there is no significant crowding of the foramen magnum. There is a small developmental venous anomaly in the right cerebellum. Otherwise, no areas of abnormal intracranial enhancement. The sella is unremarkable. Normal flow voids. ORBITS: No acute abnormality. SINUSES: Mucous retention cyst in the right maxillary sinus. Trace fluid in the right mastoid air cells. BONES AND SOFT TISSUES: Normal bone marrow signal and enhancement. No acute soft tissue abnormality. IMPRESSION: 1. No acute intracranial abnormality. 2. Small developmental venous anomaly in the right cerebellum. 3. Cerebellar tonsillar ectopia with the cerebellar tonsils extending below the foramen magnum by approximately 4 mm, without significant crowding of the foramen magnum. Electronically signed by: Donnice Mania MD 11/15/2024 01:04 PM EST RP Workstation: [id number]        Scheduled Meds:  loratadine   10 mg Oral Daily   Vitamin D (Ergocalciferol)  50,000 Units Oral Q7 days    Continuous Infusions:   LOS: 6 days    Time spent: 52 minutes spent on 11/15/2024 caring for this patient face-to-face including chart review, ordering labs/tests, documenting, discussion with nursing staff, consultants, updating family and interview/physical exam    Camellia PARAS Kailin Principato, DO [facility] Available via Epic secure chat 7am-7pm After these hours, please refer to coverage provider listed on [email or web address] 11/15/2024, 2:25 PM   "

## 2024-11-15 NOTE — Plan of Care (Signed)
 Neurology plan of care  Please see neurology consult ntoe from Dr. Michaela from yesterday:  Julie Ward is a 29 y.o. female with a history of IIH, though no recent symptoms, who presents with bilateral symmetric paresthesias that have been progressing in both intensity and distribution.  With intact reflexes, I do not really have any concern for guillian-barre syndrome, but I am concerned for myelopathy.  With the distribution of symptoms, syrinx could be a consideration, as could metabolic myelopathy, or demyelinating lesion.  I will get both brain and C-spine MRIs to assess for this  MRI brain and c spine with and without contrast were ordered but have not yet been performed. We will f/u again when those results are available.  Elida Ross, MD [facility] [phone]  If 7pm- 7am, please page neurology on call as listed in AMION.

## 2024-11-16 DIAGNOSIS — R1115 Cyclical vomiting syndrome unrelated to migraine: Secondary | ICD-10-CM | POA: Diagnosis not present

## 2024-11-16 DIAGNOSIS — R202 Paresthesia of skin: Secondary | ICD-10-CM | POA: Diagnosis not present

## 2024-11-16 MED ORDER — VITAMIN D (ERGOCALCIFEROL) 1.25 MG (50000 UNIT) PO CAPS: 50000.0000 [IU] | ORAL_CAPSULE | ORAL | 0 refills | Status: AC

## 2024-11-16 MED ORDER — CHOLECALCIFEROL 25 MCG (1000 UT) PO CAPS: 1000.0000 [IU] | ORAL_CAPSULE | Freq: Every day | ORAL | 0 refills | Status: AC

## 2024-11-16 NOTE — Progress Notes (Signed)
 " PROGRESS NOTE    Julie Ward  FMW:[id number] DOB: 04-16-1996 DOA: 11/09/2024 PCP: Johnny Garnette LABOR, MD    Brief Narrative:   Julie Ward is a 29 y.o. female with past medical history significant for Crohn's disease, idiopathic intracranial hypertension who presented to [facility] ED on 11/09/2024 by direction of her gastroenterologist, Dr. Avram for complaints of nausea and vomiting.  Ongoing for months.  Denies any preceding nausea.  Reports throws up around 30-40 minutes after consuming a drink or meal.  Several antiemetics have been tried but states Zofran  works best for her.  Currently taking 4 mg of Zofran  30 minutes prior to meals 3 times daily but still reports emesis 3 times a day.  Reglan  did not help and many of the other antiemetics has given her side effects.  Patient also endorses low-level chronic abdominal pain which is unchanged during this timeframe as well.  Endorses proximal 30 pound weight loss in the last 2 months due to her symptoms.  Patient reports high anxiety due to her symptoms that has not changed over the last several months.  In the ED, temperature 90.8 F, HR 97, RR 20, BP 142/93, SpO2 100% on room air.  WBC 5.2, hemoglobin 14.8, platelet count 3 3.  Sodium 138, potassium 2.8, chloride 92, CO2 28, glucose 112, BUN less than 5, creatinine 0.71.  Lipase 55, AST 37, ALT 49, total bilirubin 1.0.  hCG negative.  Urinalysis unrevealing.  CT chest/abdomen/pelvis with contrast with no acute findings, status post left thyroidectomy with heterogeneous enlargement of the residual right thyroid  gland, nonspecific (recommend dedicated thyroid  sonography), moderate hepatic steatosis, s/p cholecystectomy and appendectomy.  GI was consulted.  TRH consulted for admission for further evaluation and management of intractable nausea/vomiting, electrolyte disturbance.  Assessment & Plan:    Ascending weakness/paresthesias Patient reports decreased sensation to  light touch that has been progressing proximally over the last 3 weeks.  Initially starting in her lower extremities now progressed to her upper torso as well as posterior surface of her proximal upper extremities.  Also reporting issues with her vision; although she does wear glasses at baseline.  Noting possible contributing factors as electrolyte disturbances with hypokalemia/hypomagnesia him, vitamin D deficiency. MRI brain with/without contrast with no acute intracranial Sharol, small developmental venous abnormality right cerebellum, cerebellar tonsillar ectopia with cerebellar tonsils extend below the foramen magnum approximately 4 mm without significant crowding of the foramen magnum (in setting of known idiopathic intracranial hypertension).  MR C-spine with and without contrast with no cervical cord lesions or abnormal enhancement, no significant spinal canal or foraminal stenosis, enlargement of the partially visualized right thyroid  lobe with multiple thyroid  nodules resulting in mild leftward deviation of the extrathoracic trachea. -- Neurology following, appreciate assistance -- Vitamin B1, vitamin E, copper level: Pending -- Continue PT/OT efforts, currently recommending home health with rollator  Intractable nausea/vomiting Underwent EGD on 10/05/2024 with no significant findings.  Colonoscopy on 10/19/2024 -- Zofran  4 mg p.o. every 8 hours as needed nausea/vomiting  Hypokalemia Potassium 2.8 on admission, likely secondary to GI loss from nausea/vomiting.  Repleted. -- Potassium 3.8 this morning --Continue monitor electrolytes closely  Hypomagnesemia Magnesium  1.6, will replete. -- Repeat electrolytes in a.m.  Vitamin D deficiency, severe Vitamin D 25-hydroxy level less than 6.0. -- Start ergocalciferol 50,000 units PO weekly x 8 weeks -- Will need to follow vitamin D levels outpatient.  Crohn's disease; questionable diagnosis per GI Follows with gastroenterology outpatient,  Dr. Avram.  Colonoscopy  10/19/2024 with findings of patchy area mild erythematous mucosa entire colon otherwise unrevealing s/p biopsy concerning for focal active colitis with crypt abscesses.  Seen by GI, Dr. Avram and Dr. San.  Dr. Avram not confident that the colitis found at recent colonoscopy is Crohn's disease and recommends against restarting budesonide  or other treatment at this time. -- Has follow-up with Dr. Avram scheduled on November 27, 2024  History of idiopathic intracranial hypertension Follows with [facility], Dr. Vear.  Recent CT head without contrast 11/05/2024 with no acute intracranial normality. -- Continue outpatient follow-up with neurology  History of thyroidectomy Heterogeneous enlargement of residual right thyroid  gland, nonspecific found incidentally on CT scan.  MR C-spine with and without contrast with enlargement of the partially visualized right thyroid  lobe with multiple thyroid  nodules resulting in mild leftward deviation of the extrathoracic trachea.  TSH 3.550, within normal limits.  Follow-up with nonemergent outpatient thyroid  ultrasound.  Obesity, class I Body mass index is 31.01 kg/m.    DVT prophylaxis: SCDs Start: 11/09/24 1814    Code Status: Full Code Family Communication: Updated multiple family was present at bedside this morning  Disposition Plan:  Level of care: Med-Surg Status is: Inpatient Remains inpatient appropriate because: Neurology evaluation    Consultants:   [place] gastro[facility]ology, Dr. Avram, Dr. San Neurology  Procedures:  None  Antimicrobials:  None   Subjective: Patient seen examined bedside, lying in bed.  Multiple family members present.  No complaints this morning.  Awaiting for further recommendations from neurology.  Hopeful for discharge home this afternoon.  No other questions or concerns at this time.  Denies headache, no chest pain, no shortness of breath, no  abdominal pain, no fever/chills/night sweats, no vomiting/diarrhea, no cough/congestion.  No acute events overnight per nursing staff.    Objective: Vitals:   11/15/24 1356 11/15/24 2203 11/16/24 0528 11/16/24 1358  BP: (!) 126/96 136/83 116/66 139/80  Pulse: 99 (!) 101 93 95  Resp: 17 18 18 18   Temp: 98 F (36.7 C) 98.1 F (36.7 C) 98.4 F (36.9 C) 97.9 F (36.6 C)  TempSrc:  Oral Oral Oral  SpO2: 100% 99% 98% 100%  Weight:      Height:        Intake/Output Summary (Last 24 hours) at 11/16/2024 1513 Last data filed at 11/16/2024 1400 Gross per 24 hour  Intake 480 ml  Output --  Net 480 ml   Filed Weights   11/09/24 1406  Weight: 89.8 kg    Examination:  Physical Exam: GEN: NAD, alert and oriented x 3, obese HEENT: NCAT, PERRL, EOMI, sclera clear, MMM PULM: CTAB w/o wheezes/crackles, normal respiratory effort on room air with SpO2 98% at rest CV: RRR w/o M/G/R GI: abd soft, NTND, NABS, no R/G/M MSK: no peripheral edema PSYCH: normal mood/affect Integumentary: dry/intact, no rashes or wounds  Neuro Exam Mental Status: A&O x4, no dysarthria, no aphasia, fund of knowledge wnl, concentration and attention were appropriate Cranial Nerves: visual fields full, PERRL, EOMi, intact smooth pursuit, no nystagmus, no ptosis, facial sensation intact bilaterally, 5/5 jaw strength, nasolabial fold & smile symetric,  eyebrow  raise & 5/5 eye closure symetric, hearing symmetric, palate elevates symmetrically, head turning and shoulder shrug intact and symetric bilaterally, tongue protrusion is midline Motor: no pronator drift, LUE 5/5,   LLE 5/5,   RUE 4+/5,   RLE 4+/5   R. patellar 2+     R. achilles 2+        L. patellar  2+     L. achilles 2+       normal muscle bulk no significant atrophy, normal tone, no spasticity or rigidity apperciated  Sensory: Sensation is decreased to light touch to bilateral lower extremities extending proximally to level of anterior breast and inferior  scapula posteriorly.  Temp, pain, positional sense intact in all 4 ext Coordination/Movement: no tremor noted, no dysmetria  Data Reviewed: I have personally reviewed following labs and imaging studies  CBC: Recent Labs  Lab 11/09/24 1612 11/10/24 0501 11/12/24 0459  WBC 5.2 4.8 5.0  HGB 14.8 11.2* 10.6*  HCT 43.0 32.8* 31.9*  MCV 86.7 86.8 90.1  PLT 383 290 252   Basic Metabolic Panel: Recent Labs  Lab 11/09/24 1612 11/10/24 0501 11/11/24 0734 11/12/24 0459 11/13/24 0511 11/14/24 0548 11/15/24 0702  NA 138   < > 137 138 138 137 138  K 2.8*   < > 3.2* 3.5 3.6 3.8 3.9  CL 92*   < > 100 106 103 102 104  CO2 28   < > 25 24 22  21* 22  GLUCOSE 112*   < > 122* 123* 114* 105* 98  BUN <5*   < > <5* <5* <5* 9 <5*  CREATININE 0.71   < > 0.58 0.60 0.63 0.61 0.61  CALCIUM 10.0   < > 8.9 8.6* 8.8* 8.7* 9.1  MG 1.9  --  1.7  --  1.7 1.6* 2.2  PHOS  --   --   --   --   --   --  5.1*   < > = values in this interval not displayed.   GFR: Estimated Creatinine Clearance: 118.3 mL/min (by C-G formula based on SCr of 0.61 mg/dL). Liver Function Tests: Recent Labs  Lab 11/09/24 1612 11/10/24 0501  AST 37 20  ALT 49* 31  ALKPHOS 74 55  BILITOT 1.0 0.6  PROT 9.6* 6.9  ALBUMIN 4.5 3.3*   Recent Labs  Lab 11/09/24 1612  LIPASE 55*   No results for input(s): AMMONIA in the last 168 hours. Coagulation Profile: No results for input(s): INR, PROTIME in the last 168 hours. Cardiac Enzymes: No results for input(s): CKTOTAL, CKMB, CKMBINDEX, TROPONINI in the last 168 hours. BNP (last 3 results) No results for input(s): PROBNP in the last 8760 hours. HbA1C: No results for input(s): HGBA1C in the last 72 hours. CBG: No results for input(s): GLUCAP in the last 168 hours. Lipid Profile: No results for input(s): CHOL, HDL, LDLCALC, TRIG, CHOLHDL, LDLDIRECT in the last 72 hours. Thyroid  Function Tests: No results for input(s): TSH, T4TOTAL,  FREET4, T3FREE, THYROIDAB in the last 72 hours. Anemia Panel: No results for input(s): VITAMINB12, FOLATE, FERRITIN, TIBC, IRON, RETICCTPCT in the last 72 hours.  Sepsis Labs: No results for input(s): PROCALCITON, LATICACIDVEN in the last 168 hours.  No results found for this or any previous visit (from the past 240 hours).       Radiology Studies: MR CERVICAL SPINE W WO CONTRAST Result Date: 11/15/2024 EXAM: MRI CERVICAL SPINE WITH AND WITHOUT CONTRAST 11/15/2024 10:51:00 AM TECHNIQUE: Multiplanar multisequence MRI of the cervical spine was performed without and with the administration of intravenous contrast. CONTRAST: 9 mL of Gadavist. COMPARISON: None available. CLINICAL HISTORY: Myelopathy, acute, cervical spine. FINDINGS: BONES AND ALIGNMENT: Normal alignment. Normal vertebral body heights. Marrow signal is unremarkable. No abnormal enhancement in the cervical spine. SPINAL CORD: Normal spinal cord size. Normal spinal cord signal. There are no cervical cord lesions appreciated. SOFT TISSUES:  No paraspinal mass. There is enlargement of the partially visualized right thyroid  lobe with multiple thyroid  nodules noted. Recommend nonemergent thyroid  ultrasound for further evaluation. Enlargement of the right thyroid  lobe results in mild leftward deviation of the extrathoracic trachea. C2-C3: No significant disc herniation. No significant spinal canal or foraminal stenosis. C3-C4: No significant disc herniation. No significant spinal canal or foraminal stenosis. C4-C5: No significant disc herniation. No significant spinal canal or foraminal stenosis. C5-C6: No significant disc herniation. No significant spinal canal or foraminal stenosis. C6-C7: No significant disc herniation. No significant spinal canal or foraminal stenosis. C7-T1: No significant disc herniation. No significant spinal canal or foraminal stenosis. IMPRESSION: 1. No cervical cord lesions or abnormal enhancement. 2.  No significant spinal canal or foraminal stenosis at any level. 3. Enlargement of the partially visualized right thyroid  lobe with multiple thyroid  nodules resulting in mild leftward deviation of the extrathoracic trachea, recommend nonemergent thyroid  ultrasound for further evaluation. Electronically signed by: Donnice Mania MD 11/15/2024 01:45 PM EST RP Workstation: [id number]   MR BRAIN W WO CONTRAST Result Date: 11/15/2024 EXAM: MRI BRAIN WITH AND WITHOUT CONTRAST 11/15/2024 10:51:00 AM TECHNIQUE: Multiplanar multisequence MRI of the head/brain was performed with and without the administration of intravenous contrast. CONTRAST: 9 mL of Gadavist. COMPARISON: None available. CLINICAL HISTORY: Multiple sclerosis (MS); lower extremity weakness. FINDINGS: BRAIN AND VENTRICLES: No acute infarct. No acute intracranial hemorrhage. No mass effect or midline shift. No hydrocephalus. Cerebellar tonsillar ectopia with the cerebellar tonsils extending below the foramen magnum by approximately 4 mm; there is no significant crowding of the foramen magnum. There is a small developmental venous anomaly in the right cerebellum. Otherwise, no areas of abnormal intracranial enhancement. The sella is unremarkable. Normal flow voids. ORBITS: No acute abnormality. SINUSES: Mucous retention cyst in the right maxillary sinus. Trace fluid in the right mastoid air cells. BONES AND SOFT TISSUES: Normal bone marrow signal and enhancement. No acute soft tissue abnormality. IMPRESSION: 1. No acute intracranial abnormality. 2. Small developmental venous anomaly in the right cerebellum. 3. Cerebellar tonsillar ectopia with the cerebellar tonsils extending below the foramen magnum by approximately 4 mm, without significant crowding of the foramen magnum. Electronically signed by: Donnice Mania MD 11/15/2024 01:04 PM EST RP Workstation: [id number]        Scheduled Meds:  loratadine   10 mg Oral Daily   Vitamin D (Ergocalciferol)  50,000  Units Oral Q7 days   Continuous Infusions:   LOS: 7 days    Time spent: 52 minutes spent on 11/16/2024 caring for this patient face-to-face including chart review, ordering labs/tests, documenting, discussion with nursing staff, consultants, updating family and interview/physical exam    Camellia PARAS Kaelin Holford, DO [facility] Available via Epic secure chat 7am-7pm After these hours, please refer to coverage provider listed on [email or web address] 11/16/2024, 3:13 PM   "

## 2024-11-16 NOTE — Plan of Care (Signed)
  Problem: Clinical Measurements: Goal: Ability to maintain clinical measurements within normal limits will improve Outcome: Progressing   Problem: Nutrition: Goal: Adequate nutrition will be maintained Outcome: Progressing   Problem: Pain Managment: Goal: General experience of comfort will improve and/or be controlled Outcome: Progressing   Problem: Safety: Goal: Ability to remain free from injury will improve Outcome: Progressing

## 2024-11-16 NOTE — Progress Notes (Signed)
 Assessment unchanged. Pt verbalized understanding of dc instructions including medications, follow up care and when to call PCP. Discharged via wc to front entrance accompanied by Family and NT.

## 2024-11-16 NOTE — Discharge Summary (Signed)
 " Physician Discharge Summary  Julie Ward FMW:[id number] DOB: Jan 31, 1996 DOA: 11/09/2024  PCP: Johnny Garnette LABOR, MD  Admit date: 11/09/2024 Discharge date: 11/16/2024  Admitted From: Home Disposition:  Home  Recommendations for Outpatient Follow-up:  Follow up with PCP in 1-2 weeks Follow-up Neurology, Dr. Vear or Dr. Tobie Please follow up on the following pending results: Vitamin D level, B1 level, copper level Inpatient neurology recommended EMG outpatient.  Home Health: PT/OT Equipment/Devices: Walker  Discharge Condition: Stable CODE STATUS: Full code Diet recommendation: Regular diet  History of present illness:  Julie Ward is a 29 y.o. female with past medical history significant for Crohn's disease, idiopathic intracranial hypertension who presented to [facility] ED on 11/09/2024 by direction of her gastroenterologist, Dr. Avram for complaints of nausea and vomiting.  Ongoing for months.  Denies any preceding nausea.  Reports throws up around 30-40 minutes after consuming a drink or meal.  Several antiemetics have been tried but states Zofran  works best for her.  Currently taking 4 mg of Zofran  30 minutes prior to meals 3 times daily but still reports emesis 3 times a day.  Reglan  did not help and many of the other antiemetics has given her side effects.  Patient also endorses low-level chronic abdominal pain which is unchanged during this timeframe as well.  Endorses proximal 30 pound weight loss in the last 2 months due to her symptoms.  Patient reports high anxiety due to her symptoms that has not changed over the last several months.   In the ED, temperature 90.8 F, HR 97, RR 20, BP 142/93, SpO2 100% on room air.  WBC 5.2, hemoglobin 14.8, platelet count 3 3.  Sodium 138, potassium 2.8, chloride 92, CO2 28, glucose 112, BUN less than 5, creatinine 0.71.  Lipase 55, AST 37, ALT 49, total bilirubin 1.0.  hCG negative.  Urinalysis unrevealing.  CT  chest/abdomen/pelvis with contrast with no acute findings, status post left thyroidectomy with heterogeneous enlargement of the residual right thyroid  gland, nonspecific (recommend dedicated thyroid  sonography), moderate hepatic steatosis, s/p cholecystectomy and appendectomy.  GI was consulted.  TRH consulted for admission for further evaluation and management of intractable nausea/vomiting, electrolyte disturbance.  Hospital course:  Ascending weakness/paresthesias Patient reports decreased sensation to light touch that has been progressing proximally over the last 3 weeks.  Initially starting in her lower extremities now progressed to her upper torso as well as posterior surface of her proximal upper extremities.  Also reporting issues with her vision; although she does wear glasses at baseline.  Noting possible contributing factors as electrolyte disturbances with hypokalemia/hypomagnesia him, vitamin D deficiency. MRI brain with/without contrast with no acute intracranial Sharol, small developmental venous abnormality right cerebellum, cerebellar tonsillar ectopia with cerebellar tonsils extend below the foramen magnum approximately 4 mm without significant crowding of the foramen magnum (in setting of known idiopathic intracranial hypertension).  MR C-spine with and without contrast with no cervical cord lesions or abnormal enhancement, no significant spinal canal or foraminal stenosis, enlargement of the partially visualized right thyroid  lobe with multiple thyroid  nodules resulting in mild leftward deviation of the extrathoracic trachea.  Seen by neurology.  Vitamin B1, vitamin E, copper level pending at time of discharge.  Outpatient follow-up with neurology.  Inpatient neurology recommended EMG outpatient.   Intractable nausea/vomiting Underwent EGD on 10/05/2024 with no significant findings.  Colonoscopy on 10/19/2024.  Outpatient follow-up with GI.   Hypokalemia Repleted.    Hypomagnesemia Repleted.   Vitamin D deficiency, severe  Vitamin D 25-hydroxy level less than 6.0. Started ergocalciferol 50,000 units PO weekly x 8 weeks, followed by colecalciferol 1000 units daily.  Repeat vitamin D level outpatient.   Crohn's disease; questionable diagnosis per GI Follows with gastroenterology outpatient, Dr. Avram.  Colonoscopy 10/19/2024 with findings of patchy area mild erythematous mucosa entire colon otherwise unrevealing s/p biopsy concerning for focal active colitis with crypt abscesses.  Seen by GI, Dr. Avram and Dr. San.  Dr. Avram not confident that the colitis found at recent colonoscopy is Crohn's disease and recommends against restarting budesonide  or other treatment at this time. Has follow-up with Dr. Avram scheduled on November 27, 2024   History of idiopathic intracranial hypertension Follows with [facility], Dr. Vear.  Recent CT head without contrast 11/05/2024 with no acute intracranial normality. Continue outpatient follow-up with neurology   History of thyroidectomy Heterogeneous enlargement of residual right thyroid  gland, nonspecific found incidentally on CT scan.  MR C-spine with and without contrast with enlargement of the partially visualized right thyroid  lobe with multiple thyroid  nodules resulting in mild leftward deviation of the extrathoracic trachea.  TSH 3.550, within normal limits.  Follow-up with nonemergent outpatient thyroid  ultrasound.   Obesity, class I Body mass index is 31.01 kg/m.     Discharge Diagnoses:  Principal Problem:   Persistent vomiting Active Problems:   Crohn's disease (HCC)   Hypokalemia   Idiopathic intracranial hypertension   H/O thyroidectomy    Discharge Instructions  Discharge Instructions     Ambulatory referral to Neurology   Complete by: As directed    An appointment is requested in approximately: 4 wks   Call MD for:  difficulty breathing, headache or visual  disturbances   Complete by: As directed    Call MD for:  extreme fatigue   Complete by: As directed    Call MD for:  persistant dizziness or light-headedness   Complete by: As directed    Call MD for:  persistant nausea and vomiting   Complete by: As directed    Call MD for:  severe uncontrolled pain   Complete by: As directed    Call MD for:  temperature >100.4   Complete by: As directed    Increase activity slowly   Complete by: As directed       Allergies as of 11/16/2024       Reactions   Penicillins Shortness Of Breath, Nausea Only, Other (See Comments)   Made her sick/Childhood Allergy Has patient had a PCN reaction causing immediate rash, facial/tongue/throat swelling, SOB or lightheadedness with hypotension:yes Has patient had a PCN reaction causing severe rash involving mucus membranes or skin necrosis: No Has patient had a PCN reaction that required hospitalization: No Has patient had a PCN reaction occurring within the last 10 years:No .   Prednisone Hives   Fentanyl  Anxiety   Hydrocodone  Nausea And Vomiting   Metoclopramide  Other (See Comments)   Sweaty, felt unwell   Morphine  And Codeine Nausea And Vomiting, Other (See Comments)   Caused stomach to burn    Oxycontin  [oxycodone  Hcl] Nausea And Vomiting   Valium  [diazepam ] Other (See Comments)   Hyperactivity        Medication List     STOP taking these medications    budesonide  3 MG 24 hr capsule Commonly known as: ENTOCORT EC    cetirizine 10 MG tablet Commonly known as: ZyrTEC Allergy   dicyclomine  20 MG tablet Commonly known as: BENTYL    diphenoxylate -atropine  2.5-0.025 MG tablet Commonly known  as: Lomotil    hyoscyamine  0.125 MG SL tablet Commonly known as: LEVSIN  SL   potassium chloride  SA 20 MEQ tablet Commonly known as: KLOR-CON  M       TAKE these medications    Cholecalciferol 25 MCG (1000 UT) capsule Take 1 capsule (1,000 Units total) by mouth daily. Start taking on: January 03, 2025   ondansetron  4 MG disintegrating tablet Commonly known as: ZOFRAN -ODT Take 1 tablet (4 mg total) by mouth 3 (three) times daily before meals. What changed:  when to take this additional instructions   Vitamin D (Ergocalciferol) 1.25 MG (50000 UNIT) Caps capsule Commonly known as: DRISDOL Take 1 capsule (50,000 Units total) by mouth every 7 (seven) days for 7 doses. Start taking on: November 21, 2024               Durable Medical Equipment  (From admission, onward)           Start     Ordered   11/14/24 1227  For home use only DME 4 wheeled rolling walker with seat  Once       Question:  Patient needs a walker to treat with the following condition  Answer:  Gait abnormality   11/14/24 1226   11/14/24 1222  For home use only DME 4 wheeled rolling walker with seat  Once       Question:  Patient needs a walker to treat with the following condition  Answer:  Unsteady gait   11/14/24 1221            Contact information for follow-up providers     Johnny Garnette LABOR, MD. Schedule an appointment as soon as possible for a visit in 1 week(s).   Specialty: Family Medicine Contact information: [address] [place] [address] [phone]         Vear Charlie LABOR, MD. Schedule an appointment as soon as possible for a visit.   Specialty: Neurology Contact information: [address] [place] [address] [phone]         Avram Lupita BRAVO, MD. Go on 11/28/2024.   Specialty: Gastroenterology Contact information: [address] [place] [address] [phone]         Patel, Donika K, DO. Schedule an appointment as soon as possible for a visit.   Specialty: Neurology Contact information: [address] [place] [address] [phone]              Contact information for after-discharge care     Durable Medical Equipment     [facility] (DME) Follow up.   Service: Durable Medical Equipment Why:  rollator Contact information: [address] [place]  [address] [phone]             Home Medical Care     [facility] .   Service: Home Health Services Why: HHPT Contact information: [address] [place] [address] [place]  [address] [phone]                    Allergies[1]  Consultations: Neurology   Procedures/Studies: MR CERVICAL SPINE W WO CONTRAST Result Date: 11/15/2024 EXAM: MRI CERVICAL SPINE WITH AND WITHOUT CONTRAST 11/15/2024 10:51:00 AM TECHNIQUE: Multiplanar multisequence MRI of the cervical spine was performed without and with the administration of intravenous contrast. CONTRAST: 9 mL of Gadavist. COMPARISON: None available. CLINICAL HISTORY: Myelopathy, acute, cervical spine. FINDINGS: BONES AND ALIGNMENT: Normal  alignment. Normal vertebral body heights. Marrow signal is unremarkable. No abnormal enhancement in the cervical spine. SPINAL CORD: Normal spinal cord size. Normal spinal cord signal. There are no cervical cord lesions appreciated. SOFT TISSUES: No paraspinal mass. There is enlargement of the partially visualized right thyroid  lobe with multiple thyroid  nodules noted. Recommend nonemergent thyroid  ultrasound for further evaluation. Enlargement of the right thyroid  lobe results in mild leftward deviation of the extrathoracic trachea. C2-C3: No significant disc herniation. No significant spinal canal or foraminal stenosis. C3-C4: No significant disc herniation. No significant spinal canal or foraminal stenosis. C4-C5: No significant disc herniation. No significant spinal canal or foraminal stenosis. C5-C6: No significant disc herniation. No significant spinal canal or foraminal stenosis. C6-C7: No significant disc herniation. No significant spinal canal or foraminal stenosis. C7-T1: No significant disc herniation. No significant spinal canal or foraminal stenosis.  IMPRESSION: 1. No cervical cord lesions or abnormal enhancement. 2. No significant spinal canal or foraminal stenosis at any level. 3. Enlargement of the partially visualized right thyroid  lobe with multiple thyroid  nodules resulting in mild leftward deviation of the extrathoracic trachea, recommend nonemergent thyroid  ultrasound for further evaluation. Electronically signed by: Donnice Mania MD 11/15/2024 01:45 PM EST RP Workstation: [id number]   MR BRAIN W WO CONTRAST Result Date: 11/15/2024 EXAM: MRI BRAIN WITH AND WITHOUT CONTRAST 11/15/2024 10:51:00 AM TECHNIQUE: Multiplanar multisequence MRI of the head/brain was performed with and without the administration of intravenous contrast. CONTRAST: 9 mL of Gadavist. COMPARISON: None available. CLINICAL HISTORY: Multiple sclerosis (MS); lower extremity weakness. FINDINGS: BRAIN AND VENTRICLES: No acute infarct. No acute intracranial hemorrhage. No mass effect or midline shift. No hydrocephalus. Cerebellar tonsillar ectopia with the cerebellar tonsils extending below the foramen magnum by approximately 4 mm; there is no significant crowding of the foramen magnum. There is a small developmental venous anomaly in the right cerebellum. Otherwise, no areas of abnormal intracranial enhancement. The sella is unremarkable. Normal flow voids. ORBITS: No acute abnormality. SINUSES: Mucous retention cyst in the right maxillary sinus. Trace fluid in the right mastoid air cells. BONES AND SOFT TISSUES: Normal bone marrow signal and enhancement. No acute soft tissue abnormality. IMPRESSION: 1. No acute intracranial abnormality. 2. Small developmental venous anomaly in the right cerebellum. 3. Cerebellar tonsillar ectopia with the cerebellar tonsils extending below the foramen magnum by approximately 4 mm, without significant crowding of the foramen magnum. Electronically signed by: Donnice Mania MD 11/15/2024 01:04 PM EST RP Workstation: [id number]   CT CHEST ABDOMEN PELVIS W  CONTRAST Result Date: 11/09/2024 EXAM: CT CHEST, ABDOMEN AND PELVIS WITH CONTRAST 11/09/2024 06:58:42 PM TECHNIQUE: CT of the chest, abdomen and pelvis was performed with the administration of 100 mL of iohexol  (OMNIPAQUE ) 300 MG/ML solution. Multiplanar reformatted images are provided for review. Automated exposure control, iterative reconstruction, and/or weight based adjustment of the mA/kV was utilized to reduce the radiation dose to as low as reasonably achievable. COMPARISON: 10/15/2024 CLINICAL HISTORY: Intractable vomiting. Weight loss. Crohn's disease and recent colitis. Hashimoto's thyroiditis. FINDINGS: CHEST: MEDIASTINUM AND LYMPH NODES: Heart and pericardium are unremarkable. The central airways are clear. No mediastinal, hilar or axillary lymphadenopathy. Status post left thyroidectomy. Heterogeneous enlargement of the residual right thyroid  gland, nonspecific. This is not well characterized on this examination and dedicated thyroid  sonography is recommended for further evaluation. LUNGS AND PLEURA: No focal consolidation or pulmonary edema. No pleural effusion or pneumothorax. ABDOMEN AND PELVIS: LIVER: Moderate hepatic steatosis. No intrahepatic mass. No intrahepatic biliary ductal dilation. GALLBLADDER AND BILE DUCTS: Status post cholecystectomy.  No biliary ductal dilatation. SPLEEN: No acute abnormality. PANCREAS: No acute abnormality. ADRENAL GLANDS: No acute abnormality. KIDNEYS, URETERS AND BLADDER: No stones in the kidneys or ureters. No hydronephrosis. No perinephric or periureteral stranding. Urinary bladder is unremarkable. GI AND BOWEL: Status post appendectomy. The stomach, small bowel, and large bowel are otherwise unremarkable. There is no bowel obstruction. REPRODUCTIVE ORGANS: No acute abnormality. PERITONEUM AND RETROPERITONEUM: No ascites. No free air. VASCULATURE: Aorta is normal in caliber. ABDOMINAL AND PELVIS LYMPH NODES: No lymphadenopathy. BONES AND SOFT TISSUES: No acute  osseous abnormality. No focal soft tissue abnormality. IMPRESSION: 1. No acute findings. 2. Status post left thyroidectomy with heterogeneous enlargement of the residual right thyroid  gland, nonspecific. Dedicated thyroid  sonography is recommended for further evaluation. 3. Moderate hepatic steatosis 4. Status post cholecystectomy and appendectomy. Electronically signed by: Dorethia Molt MD 11/09/2024 08:48 PM EST RP Workstation: [id number]   CT HEAD WO CONTRAST ( ) Result Date: 11/05/2024 EXAM: CT HEAD WITHOUT CONTRAST 11/05/2024 03:41:48 PM TECHNIQUE: CT of the head was performed without the administration of intravenous contrast. Automated exposure control, iterative reconstruction, and/or weight based adjustment of the mA/kV was utilized to reduce the radiation dose to as low as reasonably achievable. COMPARISON: 09/05/2021. CLINICAL HISTORY: Vomiting - h/o intracrainal hypertension FINDINGS: BRAIN AND VENTRICLES: No acute hemorrhage. No evidence of acute infarct. No hydrocephalus. No extra-axial collection. No mass effect or midline shift. ORBITS: No acute abnormality. SINUSES: No acute abnormality. SOFT TISSUES AND SKULL: No acute soft tissue abnormality. No skull fracture. IMPRESSION: 1. No acute intracranial abnormality. Electronically signed by: Ryan Chess MD 11/05/2024 03:55 PM EST RP Workstation: [id number]     Subjective: Patient seen examined bedside, lying in bed. Multiple family members present.  No complaints this morning.  Okay for discharge per neurology with outpatient follow-up with neurology with recommendation of EMG study. No other questions or concerns at this time.  Denies headache, no chest pain, no shortness of breath, no abdominal pain, no fever/chills/night sweats, no vomiting/diarrhea, no cough/congestion.  No acute events overnight per nursing staff.   Discharge Exam: Vitals:   11/16/24 0528 11/16/24 1358  BP: 116/66 139/80  Pulse: 93 95  Resp: 18 18  Temp: 98.4  F (36.9 C) 97.9 F (36.6 C)  SpO2: 98% 100%   Vitals:   11/15/24 1356 11/15/24 2203 11/16/24 0528 11/16/24 1358  BP: (!) 126/96 136/83 116/66 139/80  Pulse: 99 (!) 101 93 95  Resp: 17 18 18 18   Temp: 98 F (36.7 C) 98.1 F (36.7 C) 98.4 F (36.9 C) 97.9 F (36.6 C)  TempSrc:  Oral Oral Oral  SpO2: 100% 99% 98% 100%  Weight:      Height:        Physical Exam: GEN: NAD, alert and oriented x 3, obese HEENT: NCAT, PERRL, EOMI, sclera clear, MMM PULM: CTAB w/o wheezes/crackles, normal respiratory effort on room air with SpO2 98% at rest CV: RRR w/o M/G/R GI: abd soft, NTND, NABS, no R/G/M MSK: no peripheral edema PSYCH: normal mood/affect Integumentary: dry/intact, no rashes or wounds    The results of significant diagnostics from this hospitalization (including imaging, microbiology, ancillary and laboratory) are listed below for reference.     Microbiology: No results found for this or any previous visit (from the past 240 hours).   Labs: BNP (last 3 results) No results for input(s): BNP in the last 8760 hours. Basic Metabolic Panel: Recent Labs  Lab 11/11/24 0734 11/12/24 0459 11/13/24 0511 11/14/24 0548 11/15/24 0702  NA  137 138 138 137 138  K 3.2* 3.5 3.6 3.8 3.9  CL 100 106 103 102 104  CO2 25 24 22  21* 22  GLUCOSE 122* 123* 114* 105* 98  BUN <5* <5* <5* 9 <5*  CREATININE 0.58 0.60 0.63 0.61 0.61  CALCIUM 8.9 8.6* 8.8* 8.7* 9.1  MG 1.7  --  1.7 1.6* 2.2  PHOS  --   --   --   --  5.1*   Liver Function Tests: Recent Labs  Lab 11/10/24 0501  AST 20  ALT 31  ALKPHOS 55  BILITOT 0.6  PROT 6.9  ALBUMIN 3.3*   No results for input(s): LIPASE, AMYLASE in the last 168 hours.  No results for input(s): AMMONIA in the last 168 hours. CBC: Recent Labs  Lab 11/10/24 0501 11/12/24 0459  WBC 4.8 5.0  HGB 11.2* 10.6*  HCT 32.8* 31.9*  MCV 86.8 90.1  PLT 290 252   Cardiac Enzymes: No results for input(s): CKTOTAL, CKMB, CKMBINDEX,  TROPONINI in the last 168 hours. BNP: Invalid input(s): POCBNP CBG: No results for input(s): GLUCAP in the last 168 hours. D-Dimer No results for input(s): DDIMER in the last 72 hours. Hgb A1c No results for input(s): HGBA1C in the last 72 hours. Lipid Profile No results for input(s): CHOL, HDL, LDLCALC, TRIG, CHOLHDL, LDLDIRECT in the last 72 hours. Thyroid  function studies No results for input(s): TSH, T4TOTAL, T3FREE, THYROIDAB in the last 72 hours.  Invalid input(s): FREET3 Anemia work up No results for input(s): VITAMINB12, FOLATE, FERRITIN, TIBC, IRON, RETICCTPCT in the last 72 hours. Urinalysis    Component Value Date/Time   COLORURINE AMBER (A) 11/09/2024 1625   APPEARANCEUR HAZY (A) 11/09/2024 1625   LABSPEC 1.024 11/09/2024 1625   PHURINE 5.0 11/09/2024 1625   GLUCOSEU NEGATIVE 11/09/2024 1625   GLUCOSEU NEGATIVE 02/28/2014 1518   HGBUR NEGATIVE 11/09/2024 1625   BILIRUBINUR SMALL (A) 11/09/2024 1625   BILIRUBINUR neg 04/21/2022 1603   KETONESUR 20 (A) 11/09/2024 1625   PROTEINUR 100 (A) 11/09/2024 1625   UROBILINOGEN 0.2 04/21/2022 1603   UROBILINOGEN 1.0 01/20/2015 1233   NITRITE NEGATIVE 11/09/2024 1625   LEUKOCYTESUR NEGATIVE 11/09/2024 1625   Sepsis Labs Recent Labs  Lab 11/10/24 0501 11/12/24 0459  WBC 4.8 5.0   Microbiology No results found for this or any previous visit (from the past 240 hours).   Time coordinating discharge: Over 30 minutes  SIGNED:   Camellia PARAS Mylinh Cragg, DO  [facility] 11/16/2024, 4:16 PM     [1]  Allergies Allergen Reactions   Penicillins Shortness Of Breath, Nausea Only and Other (See Comments)    Made her sick/Childhood Allergy Has patient had a PCN reaction causing immediate rash, facial/tongue/throat swelling, SOB or lightheadedness with hypotension:yes Has patient had a PCN reaction causing severe rash involving mucus membranes or skin necrosis: No Has patient had  a PCN reaction that required hospitalization: No Has patient had a PCN reaction occurring within the last 10 years:No .   Prednisone Hives   Fentanyl  Anxiety   Hydrocodone  Nausea And Vomiting   Metoclopramide  Other (See Comments)    Sweaty, felt unwell   Morphine  And Codeine Nausea And Vomiting and Other (See Comments)    Caused stomach to burn    Oxycontin  [Oxycodone  Hcl] Nausea And Vomiting   Valium  [Diazepam ] Other (See Comments)    Hyperactivity    "

## 2024-11-16 NOTE — Plan of Care (Signed)
  Problem: Clinical Measurements: Goal: Ability to maintain clinical measurements within normal limits will improve Outcome: Progressing   Problem: Activity: Goal: Risk for activity intolerance will decrease Outcome: Progressing   Problem: Nutrition: Goal: Adequate nutrition will be maintained Outcome: Progressing   Problem: Coping: Goal: Level of anxiety will decrease Outcome: Progressing   Problem: Pain Managment: Goal: General experience of comfort will improve and/or be controlled Outcome: Progressing   Problem: Safety: Goal: Ability to remain free from injury will improve Outcome: Progressing

## 2024-11-16 NOTE — Progress Notes (Signed)
 Physical Therapy Treatment Patient Details Name: Julie Ward MRN: [id number] DOB: 1996-09-24 Today's Date: 11/16/2024   History of Present Illness Pt is a 29 y/o female presenting with progressive N&V at home. Found to have hypokalemia. Of note, pt reports recent EGD was clear and recent colonoscopy showed colitis. On 12/30, pt reports acute change in sensation of lower extremities and torso, as well as vision changes. Pending further workup. PMH: Crohn's disease, idiopathic intracranial HTN, GERD, hx of L thyroid  lobectomy.    PT Comments   Pt admitted with above diagnosis.  Pt currently with functional limitations due to the deficits listed below (see PT Problem List). Pt in bed when PT arrived. Visitor present. Pt agreeable to therapy intervention. Pt reported ongoing B LE pain and limited sleep. Pt is mod I with use of hospital bed features for supine to sit, S and min cues for rollator brake management for transfer tasks, gait tasks in hallway 100 feet with rollator, CGA and min cues noted core and B LE instability however no overt LOB. Pt left seated in recliner and all needs in place with visitor present. Pt is motivated to d/c home and MD entering room as PT left with PT later observing pt in transport wc for d/c home.   Pt will benefit from acute skilled PT to increase their independence and safety with mobility to allow discharge.      If plan is discharge home, recommend the following: A little help with bathing/dressing/bathroom;Assistance with cooking/housework;Assist for transportation;Help with stairs or ramp for entrance   Can travel by private vehicle        Equipment Recommendations  Rollator (4 wheels)    Recommendations for Other Services       Precautions / Restrictions Precautions Precautions: Fall Recall of Precautions/Restrictions: Intact Restrictions Weight Bearing Restrictions Per Provider Order: No     Mobility  Bed Mobility Overal bed mobility: Modified  Independent             General bed mobility comments: HOB elevated and increased time    Transfers Overall transfer level: Needs assistance Equipment used: Rollator (4 wheels) Transfers: Sit to/from Stand Sit to Stand: Supervision           General transfer comment: instruction provided on use of rollator, brake management and use as seat when in hallway with pt able to perform return demonstration with minimal cues    Ambulation/Gait Ambulation/Gait assistance: Contact guard assist Gait Distance (Feet): 100 Feet Assistive device: Rollator (4 wheels) Gait Pattern/deviations: Decreased stride length, Step-to pattern, Decreased dorsiflexion - right, Decreased dorsiflexion - left, Shuffle Gait velocity: decreased     General Gait Details: noted core instability, B LE tremuous however no overt instabiltiy noted, pt limited due to fatigue, limited stride length and foot clearance   Stairs             Wheelchair Mobility     Tilt Bed    Modified Rankin (Stroke Patients Only)       Balance Overall balance assessment: Needs assistance Sitting-balance support: No upper extremity supported, Feet supported Sitting balance-Leahy Scale: Good     Standing balance support: Bilateral upper extremity supported, During functional activity, Reliant on assistive device for balance Standing balance-Leahy Scale: Fair Standing balance comment: static standing no UE support B UE support at rollator for dynamic tasks  Communication Communication Communication: No apparent difficulties  Cognition Arousal: Alert Behavior During Therapy: WFL for tasks assessed/performed   PT - Cognitive impairments: No apparent impairments                         Following commands: Intact      Cueing Cueing Techniques: Verbal cues  Exercises      General Comments        Pertinent Vitals/Pain Pain Assessment Pain Assessment:  0-10 Pain Score: 8  Pain Location: BLEs Pain Descriptors / Indicators: Heaviness, Aching, Guarding, Burning, Spasm Pain Intervention(s): Limited activity within patient's tolerance, Monitored during session, Repositioned    Home Living                          Prior Function            PT Goals (current goals can now be found in the care plan section) Acute Rehab PT Goals Patient Stated Goal: likes to work out at gym 5x/week PT Goal Formulation: With patient/family Time For Goal Achievement: 11/28/24 Potential to Achieve Goals: Good Progress towards PT goals: Progressing toward goals    Frequency    Min 3X/week      PT Plan      Co-evaluation              AM-PAC PT 6 Clicks Mobility   Outcome Measure  Help needed turning from your back to your side while in a flat bed without using bedrails?: None Help needed moving from lying on your back to sitting on the side of a flat bed without using bedrails?: None Help needed moving to and from a bed to a chair (including a wheelchair)?: None Help needed standing up from a chair using your arms (e.g., wheelchair or bedside chair)?: None Help needed to walk in hospital room?: A Little Help needed climbing 3-5 steps with a railing? : A Little 6 Click Score: 22    End of Session Equipment Utilized During Treatment: Gait belt Activity Tolerance: Patient limited by pain;Patient limited by fatigue Patient left: with family/visitor present;with call bell/phone within reach;in chair Nurse Communication: Mobility status PT Visit Diagnosis: Unsteadiness on feet (R26.81);Difficulty in walking, not elsewhere classified (R26.2);Pain;Other symptoms and signs involving the nervous system (R29.898) Pain - Right/Left:  (B) Pain - part of body: Hip;Knee;Leg     Time: 8446-8392 PT Time Calculation (min) (ACUTE ONLY): 14 min  Charges:    $Gait Training: 8-22 mins PT General Charges $$ ACUTE PT VISIT: 1 Visit                      [place], PT Acute Rehab    [place] VEAR Drone 11/16/2024, 6:46 PM

## 2024-11-17 LAB — VITAMIN E
Vitamin E (Alpha Tocopherol): 8.2 mg/L (ref 5.9–19.4)
Vitamin E(Gamma Tocopherol): 2.8 mg/L (ref 0.7–4.9)

## 2024-11-18 LAB — COPPER, SERUM: Copper: 96 ug/dL (ref 80–158)

## 2024-11-19 ENCOUNTER — Telehealth: Payer: Self-pay

## 2024-11-19 NOTE — Transitions of Care (Post Inpatient/ED Visit) (Signed)
" ° °  11/19/2024  Name: Julie Ward MRN: [id number] DOB: 31-Dec-1995  Today's TOC FU Call Status: Today's TOC FU Call Status:: Successful TOC FU Call Completed TOC FU Call Complete Date: 11/19/24  Patient's Name and Date of Birth confirmed. DOB, Name  Transition Care Management Follow-up Telephone Call Date of Discharge: 11/16/24 Discharge Facility: [facility]) Type of Discharge: Inpatient Admission Primary Inpatient Discharge Diagnosis:: Persistent Vomiting How have you been since you were released from the hospital?: Better Any questions or concerns?: No  Items Reviewed: Did you receive and understand the discharge instructions provided?: Yes Medications obtained,verified, and reconciled?: Yes (Medications Reviewed) Any new allergies since your discharge?: No Dietary orders reviewed?: NA Do you have support at home?: Yes  Medications Reviewed Today: Medications Reviewed Today     Reviewed by Lavelle Charmaine KATHEE, LPN (Licensed Practical Nurse) on 11/19/24 at 1102  Med List Status: <None>   Medication Order Taking? Sig Documenting Provider Last Dose Status Informant  Cholecalciferol  25 MCG (1000 UT) capsule [id number]  Take 1 capsule (1,000 Units total) by mouth daily. [place], Camellia PARAS, DO  Active   ondansetron  (ZOFRAN -ODT) 4 MG disintegrating tablet [id number] No Take 1 tablet (4 mg total) by mouth 3 (three) times daily before meals.  Patient taking differently: Take 4 mg by mouth See admin instructions. Dissolve 4 mg in the mouth three times a day before meals   Avram Lupita BRAVO, MD 11/08/2024 Noon Active Self  Vitamin D , Ergocalciferol , (DRISDOL ) 1.25 MG (50000 UNIT) CAPS capsule [id number]  Take 1 capsule (50,000 Units total) by mouth every 7 (seven) days for 7 doses. [place], Eric J, DO  Active             Home Care and Equipment/Supplies: Were Home Health Services Ordered?: Yes Name of Home Health Agency:: [facility] Has Agency set up a time to come to your home?:  Yes First Home Health Visit Date: 11/18/24 Any new equipment or medical supplies ordered?: Yes Were you able to get the equipment/medical supplies?: Yes Do you have any questions related to the use of the equipment/supplies?: No  Functional Questionnaire: Do you need assistance with bathing/showering or dressing?: Yes Do you need assistance with meal preparation?: Yes Do you need assistance with eating?: No Do you have difficulty maintaining continence: No Do you need assistance with getting out of bed/getting out of a chair/moving?: Yes Do you have difficulty managing or taking your medications?: No  Follow up appointments reviewed: PCP Follow-up appointment confirmed?: Yes Date of PCP follow-up appointment?: 11/23/24 Follow-up Provider: Dr. Johnny Specialist [facility] Follow-up appointment confirmed?: Yes Date of Specialist follow-up appointment?: 11/27/24 Follow-Up Specialty Provider:: GI- Dr. Avram (Neurology Dr. Vear 12/11/24) awaiting appointment with [facility] Neurology Do you need transportation to your follow-up appointment?: No Do you understand care options if your condition(s) worsen?: Yes-patient verbalized understanding    SIGNATURE Charmaine Lavelle, LPN [facility] Health Advisor [facility] [facility] You Are. We Are. One [facility] Direct Dial [phone]  "

## 2024-11-19 NOTE — Progress Notes (Signed)
 S: No change in paresthesias. Patient would like to go home.  O:  Vitals:   11/16/24 0528 11/16/24 1358  BP: 116/66 139/80  Pulse: 93 95  Resp: 18 18  Temp: 98.4 F (36.9 C) 97.9 F (36.6 C)  SpO2: 98% 100%   Constitutional: Appears well-developed and well-nourished.    Neurologic Examination      Neuro: Mental Status: Patient is awake, alert, oriented to person, place, month, year, and situation. Patient is able to give a clear and coherent history. No signs of aphasia or neglect Cranial Nerves: II: Visual Fields are full. Pupils are equal, round, and reactive to light.   III,IV, VI: EOMI without ptosis or diploplia.  V: Facial sensation is symmetric to temperature VII: Facial movement is symmetric.  VIII: hearing is intact to voice X: Uvula elevates symmetrically XII: tongue is midline without atrophy or fasciculations.  Motor: She has good strength to confrontational muscle testing with the possible exception of hip flexion and knee flexion which would be 4+. Sensory: She has diminished sensation to pin and light touch over her thighs and anterior legs, as well as over the dorsal aspect of her upper arms.  I am not able to find a clear sensory level on her back to pinprick. Deep Tendon Reflexes: 2+ and symmetric in the biceps and patellae, and brachial radialis.  She guards when attempting to check her ankle reflexes, so though I am not able to elicit them I am not sure this is a significant finding. Plantars: Toes are downgoing bilaterally.  Cerebellar: FNF intact bilaterally   Data  No findings on MRI brain or c spine to explain her sx B12 not deficient  A/P: Julie Ward is a 28 y.o. female with a history of IIH, though no recent symptoms, who presents with bilateral symmetric paresthesias that have been progressing in both intensity and distribution.  With intact reflexes, I do not really have any concern for guillian-barre syndrome. No findings on MRI brain  or c spine to suggest myelopathy.  - No further inpatient neurologic workup indicated - I will refer to Dr. Tobie with outpatient neurology for EMG/NCS.  Elida Ross, MD [facility] [phone]  If 7pm- 7am, please page neurology on call as listed in AMION.

## 2024-11-20 ENCOUNTER — Telehealth: Payer: Self-pay

## 2024-11-20 LAB — VITAMIN B1: Vitamin B1 (Thiamine): 33.8 nmol/L — ABNORMAL LOW (ref 66.5–200.0)

## 2024-11-20 NOTE — Telephone Encounter (Signed)
 Copied from CRM [id number]. Topic: Clinical - Home Health Verbal Orders >> Nov 20, 2024 10:16 AM Rea BROCKS wrote: Caller/Agency: [facility]  Callback Number: [phone]  Service Requested: Physical Therapy  Frequency: 3 week 1, 1 week 8  Any new concerns about the patient? No

## 2024-11-21 NOTE — Telephone Encounter (Signed)
 Please okay these orders  ?

## 2024-11-22 NOTE — Telephone Encounter (Signed)
 Spoke with Ted advised that VO requested have been approved, voiced udnerstanding

## 2024-11-23 ENCOUNTER — Telehealth: Admitting: Family Medicine

## 2024-11-23 ENCOUNTER — Encounter: Payer: Self-pay | Admitting: Family Medicine

## 2024-11-23 DIAGNOSIS — E049 Nontoxic goiter, unspecified: Secondary | ICD-10-CM | POA: Diagnosis not present

## 2024-11-23 DIAGNOSIS — E010 Iodine-deficiency related diffuse (endemic) goiter: Secondary | ICD-10-CM

## 2024-11-23 DIAGNOSIS — E876 Hypokalemia: Secondary | ICD-10-CM | POA: Diagnosis not present

## 2024-11-23 DIAGNOSIS — R110 Nausea: Secondary | ICD-10-CM

## 2024-11-23 DIAGNOSIS — E519 Thiamine deficiency, unspecified: Secondary | ICD-10-CM | POA: Diagnosis not present

## 2024-11-23 DIAGNOSIS — E559 Vitamin D deficiency, unspecified: Secondary | ICD-10-CM | POA: Insufficient documentation

## 2024-11-23 DIAGNOSIS — R29898 Other symptoms and signs involving the musculoskeletal system: Secondary | ICD-10-CM | POA: Insufficient documentation

## 2024-11-23 DIAGNOSIS — R112 Nausea with vomiting, unspecified: Secondary | ICD-10-CM

## 2024-11-23 DIAGNOSIS — K5010 Crohn's disease of large intestine without complications: Secondary | ICD-10-CM

## 2024-11-23 NOTE — Progress Notes (Signed)
 "  Subjective:    Patient ID: Julie Ward, female    DOB: 1996-01-19, 29 y.o.   MRN: [id number]  HPI Virtual Visit via Video Note  I connected with the patient on 11/23/2024 at  9:30 AM EST by a video enabled telemedicine application and verified that I am speaking with the correct person using two identifiers.  Location patient: home Location provider:work or home office Persons participating in the virtual visit: patient, provider  I discussed the limitations of evaluation and management by telemedicine and the availability of in person appointments. The patient expressed understanding and agreed to proceed.   HPI: Here to follow up a hospital stay from 11-09-24 to 11-16-24 when she was admitted for chronic nausea and vomiting, dehydration, and numbness and weakness in the legs. She had been dealing with the nausea for several months, and the etiology of this has been unclear. She sees Dr. Avram for her Crohn's disease, and she had been taking Zofran  3-4 times every day. The leg numbness and weakness has also been going on for several months, and she sees Dr. Vear for neurology care. On admission her potassium was low at 2.8 and vitamin B1 was low at 33.8. Her B12 level was normal  at 687. Vitamin D  was extremely low at <6.0. Her renal function is normal. TSH was normal at 3.550. A head CT was normal. A brain MR was normal. An MR of the cervical spine showed a normal spinal cord but the right thyroid  lobe was enlarged and it contained numerous small nodules. (She is S/P a left thyroid  lobectomy in 2016 for benign nodules). She was treated with IV fluids and the potassium was corrected. She was sent home on vitamin D  50,000 unit capsules weekly for 4 weeks, then she will switch to taking 1000 unit capsules daily. Since going she has had very little nausea and has not vomited at all. She has not taken any Zofran  at all, but she is still able to eat 3 small meals every day. Her overall energy level  has improved. She still has the numbness and weakness in the legs however, and she finds it hard to walk. She has not been able to drive for the past 2 months.    ROS: See pertinent positives and negatives per HPI.  Past Medical History:  Diagnosis Date   Abdominal pain, recurrent    Allergy    Anxiety    Appendicitis    Colitis    Crohn's disease of colon (HCC) 08/15/2013   sees Dr. Vina Rattler at [facility] GI    Dysmenorrhea    GERD (gastroesophageal reflux disease)    not taking any medication   IIH (idiopathic intracranial hypertension)    I have been cleared of that since July 2025   Shortness of breath dyspnea    ith thyroid  problem    Past Surgical History:  Procedure Laterality Date   APPENDECTOMY  07-14-11   laparoscopic    CHOLECYSTECTOMY  02/11/2012   Procedure: LAPAROSCOPIC CHOLECYSTECTOMY WITH INTRAOPERATIVE CHOLANGIOGRAM;  Surgeon: Lynwood MALVA Pina, MD;  Location: [facility] OR;  Service: General;  Laterality: N/A;   ESOPHAGOGASTRODUODENOSCOPY  01/28/2012   Procedure: ESOPHAGOGASTRODUODENOSCOPY (EGD);  Surgeon: Fairy VEAR Gaskins, MD;  Location: [facility] OR;  Service: Gastroenterology;  Laterality: N/A;   THYROID  LOBECTOMY Left 04/29/2015   Procedure: LEFT TOTAL THYROID  LOBECTOMY;  Surgeon: Lynwood Pina, MD;  Location: [facility] OR;  Service: General;  Laterality: Left;   WISDOM TOOTH EXTRACTION  Family History  Problem Relation Age of Onset   Cancer Paternal Aunt        ovarian, kidney   Cancer Paternal Uncle        colon   Cancer Maternal Grandmother        breast    Hypertension Maternal Grandmother    Irritable bowel syndrome Maternal Grandmother    Heart disease Maternal Grandmother    Hypertension Paternal Grandmother    Hyperlipidemia Paternal Grandmother    Colon cancer Paternal Grandfather    Esophageal cancer Neg Hx    Rectal cancer Neg Hx    Stomach cancer Neg Hx     Current Medications[1]  EXAM:  VITALS per patient if applicable:  GENERAL: alert, oriented,  appears well and in no acute distress  HEENT: atraumatic, conjunttiva clear, no obvious abnormalities on inspection of external nose and ears  NECK: normal movements of the head and neck  LUNGS: on inspection no signs of respiratory distress, breathing rate appears normal, no obvious gross SOB, gasping or wheezing  CV: no obvious cyanosis  MS: moves all visible extremities without noticeable abnormality  PSYCH/NEURO: pleasant and cooperative, no obvious depression or anxiety, speech and thought processing grossly intact  ASSESSMENT AND PLAN: Her chronic nausea and vomiting has improved dramatically but the explanation for this is not clear. Her Crohn's disease seems to be fairly quiet. She will follow up with Dr. Avram on 11-27-24. For the vitamin B1 deficiency, she will start taking a B complex vitamin daily. She is scheduled to follow up with Neurology on 12-11-24. For the enlarged right thyroid  lobe, we will assess with a thyroid  US  soon. I personally spent a total of 35 minutes in the care of the patient today including getting/reviewing separately obtained history, performing a medically appropriate exam/evaluation, placing orders, and independently interpreting results.  Garnette Olmsted, MD  Discussed the following assessment and plan:  No diagnosis found.     I discussed the assessment and treatment plan with the patient. The patient was provided an opportunity to ask questions and all were answered. The patient agreed with the plan and demonstrated an understanding of the instructions.   The patient was advised to call back or seek an in-person evaluation if the symptoms worsen or if the condition fails to improve as anticipated.      Review of Systems     Objective:   Physical Exam        Assessment & Plan:       [1]  Current Outpatient Medications:    [START ON 01/03/2025] Cholecalciferol  25 MCG (1000 UT) capsule, Take 1 capsule (1,000 Units total) by mouth  daily., Disp: 90 capsule, Rfl: 0   ondansetron  (ZOFRAN -ODT) 4 MG disintegrating tablet, Take 1 tablet (4 mg total) by mouth 3 (three) times daily before meals. (Patient taking differently: Take 4 mg by mouth See admin instructions. Dissolve 4 mg in the mouth three times a day before meals), Disp: 30 tablet, Rfl: 1   Vitamin D , Ergocalciferol , (DRISDOL ) 1.25 MG (50000 UNIT) CAPS capsule, Take 1 capsule (50,000 Units total) by mouth every 7 (seven) days for 7 doses., Disp: 7 capsule, Rfl: 0  "

## 2024-11-27 ENCOUNTER — Ambulatory Visit: Admitting: Internal Medicine

## 2024-11-27 ENCOUNTER — Encounter: Payer: Self-pay | Admitting: Internal Medicine

## 2024-11-27 VITALS — BP 128/80 | HR 98 | Ht 67.0 in | Wt 211.6 lb

## 2024-11-27 DIAGNOSIS — R29898 Other symptoms and signs involving the musculoskeletal system: Secondary | ICD-10-CM

## 2024-11-27 DIAGNOSIS — K529 Noninfective gastroenteritis and colitis, unspecified: Secondary | ICD-10-CM | POA: Diagnosis not present

## 2024-11-27 DIAGNOSIS — R1115 Cyclical vomiting syndrome unrelated to migraine: Secondary | ICD-10-CM

## 2024-11-27 DIAGNOSIS — R109 Unspecified abdominal pain: Secondary | ICD-10-CM

## 2024-11-27 DIAGNOSIS — E519 Thiamine deficiency, unspecified: Secondary | ICD-10-CM | POA: Diagnosis not present

## 2024-11-27 NOTE — Patient Instructions (Addendum)
" °  VISIT SUMMARY: Today we discussed your recent hospitalization for severe gastrointestinal symptoms and electrolyte disturbances. We reviewed your history of Crohn's disease, neuromuscular symptoms, and micronutrient deficiencies.  YOUR PLAN: CROHN'S DISEASE: You have a history of Crohn's disease with recent colitis episode, but no current symptoms. -No additional therapy is needed at this time as you are currently asymptomatic. -Contact the office if you experience any recurrent gastrointestinal symptoms.  THIAMINE DEFICIENCY: You have a thiamine deficiency likely due to poor oral intake during your recent illness. -Start over-the-counter thiamine supplementation. -Consult with a pharmacist to select an appropriate B-complex vitamin.  VITAMIN D  DEFICIENCY: You have a significant vitamin D  deficiency likely due to recent poor intake. -Continue taking high-dose vitamin D  supplementation as previously prescribed. -You have taken one dose at home and are scheduled for your second dose.  Return in 1 year - sooner if you have more problems with vomiting, abdominal pain or diarrhea.  I appreciate the opportunity to care for you. [name], MD, [facility]                       Contains text generated by Abridge.                                 Contains text generated by Abridge.   "

## 2024-11-27 NOTE — Progress Notes (Signed)
 "       Julie Ward 29 y.o. 06-30-1996 [id number]  Assessment & Plan:   Encounter Diagnoses  Name Primary?   Colitis ? Crohn's Yes   Abdominal wall pain    Persistent vomiting - resolved    Thiamine deficiency    Weakness of both lower extremities      Recent colitis episode may indicate recurrence of Crohn's but not clear that this is actually Crohn's disease as there were no chronic changes on the biopsies. - Deferred additional therapy as she remains asymptomatic. - Instructed her to contact the office if recurrent gastrointestinal symptoms develop.  Thiamine deficiency Likely due to poor oral intake during recent illness. Supplementation not initiated. - Dr. Johnny recommended over-the-counter thiamine supplementation. - Advised consultation with a pharmacist for appropriate B-complex vitamin selection since Dr. Johnny recommended OTC. - Keep follow-up with neurology.  Vitamin D  deficiency - Continued high-dose vitamin D  supplementation as previously prescribed. - Confirmed she has taken one dose at home and is scheduled for her second dose.  3 month temporary handicapped permit authorized today due to lower extremity weakness   Subjective:  Gastroenterology summary:   Crohn's ileocolitis diagnosed October 2014.  Initial complaints were abdominal pain and nausea.  Treated with budesonide  for several years, seen at [facility] then lost to follow-up since 2018 resurfaced late 2025 [facility]   Chronic recurrent nausea and vomiting, delayed gastric emptying at 2 hours on 4-hour emptying study 82% retention at 2 hours normal at 4 hours   Suspected IBS overlap   EGD 10/05/2024-normal EGD 2015 at [facility], celiac biopsies negative for celiac disease focal nonspecific chronic duodenitis   Colonoscopy 10/18/2024 normal terminal ileum and biopsies, erythematous mucosa in the entire examined colon biopsy showed focal active colitis in the ascending and transverse, active colitis with  crypt abscesses in the rectum sigmoid and transverse.  No granulomata.  No chronic changes.   MR enterography 2016 normal  CT chest abdomen pelvis with contrast 11/09/2024 IMPRESSION: 1. No acute findings. 2. Status post left thyroidectomy with heterogeneous enlargement of the residual right thyroid  gland, nonspecific. Dedicated thyroid  sonography is recommended for further evaluation. 3. Moderate hepatic steatosis 4. Status post cholecystectomy and appendectomy ------------------------------------------------------------------------------------------ Chief Complaint: Follow-up of colitis  HPI Discussed the use of AI scribe software for clinical note transcription with the patient, who gave verbal consent to proceed.  Julie Ward is a 29 year old female with possible Crohn's disease who presents for follow-up after recent hospitalization for severe gastrointestinal symptoms and electrolyte disturbances.  She had been suffering with persistent vomiting and significantly reduced oral intake, severe hypokalemia.  She was found to be thiamine deficient and vitamin D  deficient.  Her mother is present and participates in the history today.  Gastrointestinal Symptoms and Crohn's Disease: - Hospitalized recently for several days due to severe nausea and vomiting - Potassium and other electrolyte abnormalities corrected during hospitalization - No ongoing nausea, vomiting, or diarrhea - Residual abdominal soreness, particularly when sneezing, attributed to prior prolonged vomiting - History of Crohn's disease diagnosed 10-12 years ago with small bowel involvement - Previously treated with budesonide , discontinued after period of stability - In late 2025, developed recurrent diarrhea lasting weeks to months - Imaging and colonoscopy showed colonic inflammation with subsequent biopsies - Currently has formed bowel movements and no ongoing gastrointestinal complaints  Neuromuscular  Symptoms: - Progressive leg weakness since last appointment, now requiring a walker - Numbness and tingling in the legs, worse at night -  Symptoms have improved slightly since returning home from the hospital - Awaiting neurology evaluation has follow-up later this month with the NP  Micronutrient Deficiencies: - Documented low vitamin D  and thiamine levels, as well as prior electrolyte imbalances - Currently taking vitamin D  50,000 units weekly, started during hospitalization; one dose taken since discharge - Has not started thiamine supplementation due to difficulty obtaining an appropriate preparation - Attributes deficiencies to poor oral intake during recent illness  Idiopathic Intracranial Hypertension: - History of idiopathic intracranial hypertension, apparently resolved     Allergies[1] Active Medications[2] Past Medical History:  Diagnosis Date   Abdominal pain, recurrent    Allergy    Anxiety    Appendicitis    Colitis    Crohn's disease of colon (HCC) 08/15/2013   sees Dr. Vina Rattler at [facility]    Dysmenorrhea    GERD (gastroesophageal reflux disease)    not taking any medication   IIH (idiopathic intracranial hypertension)    I have been cleared of that since July 2025   Shortness of breath dyspnea    ith thyroid  problem   Past Surgical History:  Procedure Laterality Date   APPENDECTOMY  07-14-11   laparoscopic    CHOLECYSTECTOMY  02/11/2012   Procedure: LAPAROSCOPIC CHOLECYSTECTOMY WITH INTRAOPERATIVE CHOLANGIOGRAM;  Surgeon: Lynwood MALVA Pina, MD;  Location: [facility] OR;  Service: General;  Laterality: N/A;   ESOPHAGOGASTRODUODENOSCOPY  01/28/2012   Procedure: ESOPHAGOGASTRODUODENOSCOPY (EGD);  Surgeon: Fairy VEAR Gaskins, MD;  Location: [facility] OR;  Service: Gastroenterology;  Laterality: N/A;   THYROID  LOBECTOMY Left 04/29/2015   Procedure: LEFT TOTAL THYROID  LOBECTOMY;  Surgeon: Lynwood Pina, MD;  Location: [facility] OR;  Service: General;  Laterality: Left;   WISDOM TOOTH  EXTRACTION     Social History   Social History Narrative   Right handed   Caffeine use: coffee/soda (coffee 2-3 times per week, soda daily)   family history includes Cancer in her maternal grandmother, paternal aunt, and paternal uncle; Colon cancer in her paternal grandfather; Heart disease in her maternal grandmother; Hyperlipidemia in her paternal grandmother; Hypertension in her maternal grandmother and paternal grandmother; Irritable bowel syndrome in her maternal grandmother.   Review of Systems As above  Objective:   Physical Exam BP 128/80   Pulse 98   Ht 5' 7 (1.702 m)   Wt 211 lb 9.6 oz (96 kg)   LMP 11/01/2024 (Exact Date)   BMI 33.14 kg/m  No acute distress Abdomen is soft and nontender bowel sounds present  Regarding data reviewed see HPI, summary and assessment and plan.    [1]  Allergies Allergen Reactions   Penicillins Shortness Of Breath, Nausea Only and Other (See Comments)    Made her sick/Childhood Allergy Has patient had a PCN reaction causing immediate rash, facial/tongue/throat swelling, SOB or lightheadedness with hypotension:yes Has patient had a PCN reaction causing severe rash involving mucus membranes or skin necrosis: No Has patient had a PCN reaction that required hospitalization: No Has patient had a PCN reaction occurring within the last 10 years:No .   Prednisone Hives   Fentanyl  Anxiety   Hydrocodone  Nausea And Vomiting   Metoclopramide  Other (See Comments)    Sweaty, felt unwell   Morphine  And Codeine Nausea And Vomiting and Other (See Comments)    Caused stomach to burn    Oxycontin  [Oxycodone  Hcl] Nausea And Vomiting   Valium  [Diazepam ] Other (See Comments)    Hyperactivity   [2]  Current Meds  Medication Sig   ondansetron  (ZOFRAN -ODT)  4 MG disintegrating tablet Take 1 tablet (4 mg total) by mouth 3 (three) times daily before meals. (Patient taking differently: Take 4 mg by mouth See admin instructions. Dissolve 4 mg in the  mouth three times a day before meals)   Vitamin D , Ergocalciferol , (DRISDOL ) 1.25 MG (50000 UNIT) CAPS capsule Take 1 capsule (50,000 Units total) by mouth every 7 (seven) days for 7 doses.   "

## 2024-12-04 ENCOUNTER — Encounter: Payer: Self-pay | Admitting: Internal Medicine

## 2024-12-05 ENCOUNTER — Ambulatory Visit
Admission: RE | Admit: 2024-12-05 | Discharge: 2024-12-05 | Disposition: A | Source: Ambulatory Visit | Attending: Family Medicine | Admitting: Family Medicine

## 2024-12-05 ENCOUNTER — Ambulatory Visit: Payer: Self-pay

## 2024-12-05 DIAGNOSIS — E010 Iodine-deficiency related diffuse (endemic) goiter: Secondary | ICD-10-CM

## 2024-12-05 MED ORDER — THIAMINE HCL 100 MG PO TABS: 100.0000 mg | ORAL_TABLET | Freq: Every day | ORAL | Status: DC

## 2024-12-05 NOTE — Telephone Encounter (Signed)
 Spoke to patient.  Having diarrhea again.  She was well until she took the B complex vitamin.  She is concerned because of persistent leg weakness and some paresthesia-like symptoms.  I have recommended the following:  #1 restart budesonide  9 mg daily (she has)  #2 thiamine  100 mg p.o. daily please tell her that this is over-the-counter and she needs to buy it she can ask the pharmacist about it  #3 keep neurology appointment next week  #4 she needs an appointment with me in February and scheduled for February 4 at 11:30 AM or if that does not work find a spot later in the month

## 2024-12-05 NOTE — Telephone Encounter (Signed)
 FYI Only or Action Required?: Action required by provider: update on patient condition.  Patient was last seen in primary care on 11/23/2024 by Johnny Garnette LABOR, MD.  Called Nurse Triage reporting Numbness.  Symptoms began Ongoing.  Interventions attempted: Nothing.  Symptoms are: rapidly worsening.  Triage Disposition: Information or Advice Only Call  Patient/caregiver understands and will follow disposition?: Yes          Message from [place] B sent at 12/05/2024  3:59 PM EST  Summary: [id number]: Buford Skates   Reason for Triage: diarrhea, nausea, numbness and tingling all over the body, weakness, fatigue, stomach pain      Reason for Disposition  Health information question, no triage required and triager able to answer question  Answer Assessment - Initial Assessment Questions 1. REASON FOR CALL: What is the main reason for your call? or How can I best help you?       Patient called in stating she is having issues walking, numbness, and tingling all over her body, abdominal pain noted as well. Symptoms are rapidly worsening per the patient. She was calling to advise that she is going back to the ED. She will follow-up at a later time. This RN agreed with plan of care.  Protocols used: Information Only Call - No Triage-A-AH

## 2024-12-06 ENCOUNTER — Telehealth: Payer: Self-pay

## 2024-12-06 ENCOUNTER — Encounter: Payer: Self-pay | Admitting: Family Medicine

## 2024-12-06 ENCOUNTER — Ambulatory Visit: Admitting: Family Medicine

## 2024-12-06 ENCOUNTER — Ambulatory Visit: Payer: Self-pay

## 2024-12-06 VITALS — BP 122/80 | Temp 98.3°F | Ht 67.0 in | Wt 204.0 lb

## 2024-12-06 DIAGNOSIS — R112 Nausea with vomiting, unspecified: Secondary | ICD-10-CM

## 2024-12-06 DIAGNOSIS — R29898 Other symptoms and signs involving the musculoskeletal system: Secondary | ICD-10-CM | POA: Diagnosis not present

## 2024-12-06 DIAGNOSIS — R1084 Generalized abdominal pain: Secondary | ICD-10-CM | POA: Diagnosis not present

## 2024-12-06 NOTE — Telephone Encounter (Signed)
 FYI Only or Action Required?: FYI only for provider: appointment scheduled on 12/06/24.  Patient was last seen in primary care on 11/23/2024 by Johnny Garnette LABOR, MD.  Called Nurse Triage reporting Abdominal Pain.  Symptoms began 11/30/24.  Interventions attempted: Rest, hydration, or home remedies.  Symptoms are: gradually worsening.  Triage Disposition: See HCP Within 4 Hours (Or PCP Triage) pt wanted to talk to provider stated she was too unwell to come in. Advised pt virtual visits limit the provider as to what she can do and see. Discussed ED as the disposition and pt refused but stated she will go if her provider tells her to go.  Patient/caregiver understands and will follow disposition?:           Message from Monaca S sent at 12/06/2024  8:03 AM EST  Reason for Triage: abdominal pain, diarrhea, difficulty walking, tingling all over body- worsening   Reason for Disposition  [1] MILD-MODERATE pain AND [2] constant AND [3] present > 2 hours  Answer Assessment - Initial Assessment Questions Pt stated she took a Vitamin B complex pill last Friday and 3 hours later with abd pain and pt present since. Pt wanted to talk to provider and said she will go to ED if advised by provider.      1. LOCATION: Where does it hurt?      Lower left and right side of abdomen 2. RADIATION: Does the pain shoot anywhere else? (e.g., chest, back)     no 3. ONSET: When did the pain begin? (e.g., minutes, hours or days ago)      11/30/24 4. SUDDEN: Gradual or sudden onset?     Gradually  5. PATTERN Does the pain come and go, or is it constant?     constant 6. SEVERITY: How bad is the pain?  (e.g., Scale 1-10; mild, moderate, or severe)     7/10 7. RECURRENT SYMPTOM: Have you ever had this type of stomach pain before? If Yes, ask: When was the last time? and What happened that time?      Yes- Chrohn's disease 8. CAUSE: What do you think is causing the stomach pain? (e.g.,  gallstones, recent abdominal surgery)     Vitamin B Complex 3 hours later pt began to have stomach cramping and aching   10. OTHER SYMPTOMS: Do you have any other symptoms? (e.g., back pain, diarrhea, fever, urination pain, vomiting)       No, diarrhea, nausea  Protocols used: Abdominal Pain - Female-A-AH

## 2024-12-06 NOTE — Telephone Encounter (Signed)
 Copied from CRM #[id number]. Topic: Clinical - Medical Advice >> Dec 06, 2024  1:57 PM Joesph NOVAK wrote: Reason for CRM: patient spoke to ED and they advised her to see if pcp will admit her to bypass the ED. Please fu with patient asap. She came in today for a appt.

## 2024-12-06 NOTE — Progress Notes (Signed)
 "  Established Patient Office Visit   Subjective:  Patient ID: Julie Ward, female    DOB: 09/25/1996  Age: 29 y.o. MRN: [id number]  Chief Complaint  Patient presents with   Abdominal Pain   Nausea    HPI: Patient is present for an acute visit. Patients primary care provider is Dr. Garnette Olmsted. Also, under the care of Dr. Lupita Commander with [facility] for recent colitis episode that may indicate recurrence of Crohn's.   Based on chart review, she has a history of Crohn's ileocolitis that was diagnosed in October 2014. She was treated with Budesonide  for several years, seen at [facility] then lost to follow up since 2018 resurfaced late 2025 [facility].   She was admitted at [facility] on 11/09/2024 through 11/16/2024 for persistent vomiting with crohn's diease, hypokalemia, hypo magnesium , ascending weakness/paraesthesias, vitamin D  deficiency, and history of idiopathic intracranial hypertension. She had a follow up with primary care provider, Dr Olmsted, on 01/09 and seen Dr. Lupita Commander with  GI on 11/27/2024. During both visits, she was improving. She has a follow up appointment with Greig Forbes, NP with [facility] on 12/11/2024.   Starting on last Friday, she reports after taking an over the counter Vitamin B complex that was recommended by Dr. Olmsted, and Dr. Commander, along with thiamine , she developed increase in abdominal pain. Pain is generalized all over, but more in the lower quads. Associated with nausea with one episode of vomiting this morning, diarrhea, increase numbness, pain, and tingling in shoulders that radiate downward. When standing up, she reports her tremors or shaking becomes worse.   Denies fever.   She reports she stop taking Vitamin B complex and not took any thiamine .   On 01/21, she contacted Dr. Commander about her not feeling well after taking the vitamin B complex. He recommended her restart Budesonide  9mg  daily, which she has not  started back  She reports she took it back in December, but thinks it made things worse. Recommended to take thiamine  100mg  daily, but she has not took supplement. She reports every time she has a pill touch her stomach, she can not tolerate it. He was scheduling her appointment for February 4th.  Review of Systems  Gastrointestinal:  Positive for abdominal pain.   See HPI above     Objective:   BP 122/80   Temp 98.3 F (36.8 C) (Oral)   Ht 5' 7 (1.702 m)   Wt 204 lb (92.5 kg)   BMI 31.95 kg/m    Physical Exam Vitals reviewed.  Constitutional:      General: She is not in acute distress.    Appearance: Normal appearance. She is obese. She is not ill-appearing, toxic-appearing or diaphoretic.  HENT:     Head: Normocephalic and atraumatic.  Eyes:     General:        Right eye: No discharge.        Left eye: No discharge.     Conjunctiva/sclera: Conjunctivae normal.  Cardiovascular:     Rate and Rhythm: Normal rate and regular rhythm.     Heart sounds: Normal heart sounds. No murmur heard.    No friction rub. No gallop.  Pulmonary:     Effort: Pulmonary effort is normal. No respiratory distress.     Breath sounds: Normal breath sounds.  Abdominal:     General: Bowel sounds are decreased. There is no distension.     Palpations: Abdomen is soft. There is no mass.  Tenderness: There is abdominal tenderness in the left lower quadrant.  Musculoskeletal:        General: Normal range of motion.  Skin:    General: Skin is warm and dry.  Neurological:     General: No focal deficit present.     Mental Status: She is alert and oriented to person, place, and time. Mental status is at baseline.  Psychiatric:        Mood and Affect: Mood normal.        Behavior: Behavior normal.        Thought Content: Thought content normal.        Judgment: Judgment normal.       Assessment & Plan:  Generalized abdominal pain  Nausea and vomiting, unspecified vomiting type  Leg  weakness, bilateral  -Consulted with Dr. Johnny, supervising physician and patients primary care provider, about patients condition after assessment. He recommended to have her start back on the Budesonide , possibly give her a steroid injection in office to help with her symptoms. However, patient declines to want to take Budesonide .  -Then, attempted to schedule an appointment with any provider with [facility]. No availability within the next two days.  -Ultimately, recommended patient to go to the closes ED if she felt her symptoms were severe enough and not going to take Budesonide .      Gracey Tolle, NP "

## 2024-12-06 NOTE — Telephone Encounter (Signed)
 I've left her a voice mail message to call me back tomorrow.

## 2024-12-06 NOTE — Telephone Encounter (Signed)
 FYI Only or Action Required?: Action required by provider: clinical question for provider and update on patient condition.  Patient was last seen in primary care on 12/06/2024 by Julie Philippe SAUNDERS, NP.  Called Nurse Triage reporting Advice Only.  Symptoms began today.  Triage Disposition: Call PCP Now  Patient/caregiver understands and will follow disposition?: Yes     Copied from CRM #[id number]. Topic: Clinical - Medical Advice >> Dec 06, 2024  1:57 PM Joesph NOVAK wrote: Reason for CRM: patient spoke to ED and they advised her to see if pcp will admit her to bypass the ED. Please fu with patient asap. She came in today for a appt.   Reason for Disposition  [1] Follow-up call from patient regarding patient's clinical status AND [2] information urgent  Answer Assessment - Initial Assessment Questions 1. REASON FOR CALL or QUESTION: What is your reason for calling today? or How can I best      Pt contacted NT stating that vomiting and diarrhea, numbness to leg, has not stopped since appt today with DOROTHA Billy, NP. Pt states she contacted ED and was told to reach out to PCP for direct admit as it sounds like I am immunocompromised and the ED is full of COVID. Pt states she was told by ED not to come through triage. Contacted [facility], spoke to [facility], she states no indication from visit earlier that pt would need to be admitted to hospital. Discussed with pt that [facility] does not accept direct admits from PCP and Dr. Johnny nor Philippe would be able to do this for her. Pt states she called the hospital NT line and was told to ask for direct admit to bypass ED. Discussed d/t GI symptoms, she could try to reach out to Dr. Darilyn office as she is established with practice and request direct admit; unsure if admits are approved via specialists. Discussed pt needs eval for leg numbness and change in walking and from GI standpoint, she may need IV hydration. Pt voiced understanding and  appreciation to staff.  Protocols used: PCP Call - No Triage-A-AH

## 2024-12-07 NOTE — Telephone Encounter (Signed)
 As noted above, I am not permitted to direct admit patients to the hospital, but I believe specialists can. She should contact Dr. Darilyn office to see if this can be done

## 2024-12-07 NOTE — Telephone Encounter (Signed)
 Noted.

## 2024-12-07 NOTE — Telephone Encounter (Signed)
 Pt was seen by Philippe Slade for this problem on 12/07/23

## 2024-12-07 NOTE — Telephone Encounter (Signed)
 Patient notified of update  and verbalized understanding.

## 2024-12-07 NOTE — Patient Instructions (Addendum)
-  It was a pleasure to see you today.  -Consulted with Dr. Johnny with suggesting to restart Budesonide .  -Attempted to make an appointment this week with [facility], but unable to get an appointment scheduled. -Ultimately, recommend to go to the closes ED if you fell your symptoms are severe enough and not going to take Budesonide .

## 2024-12-07 NOTE — Telephone Encounter (Signed)
 Spoke with pt voiced understanding of Dr Johnny advise

## 2024-12-07 NOTE — Telephone Encounter (Signed)
 I spoke with Julie Ward and we booked her to see Dr Avram 12/19/2024 at 11:30am. She is not feeling well today. She was asking about direct admittance and I told her we no longer do that. I told her she has to go to the ED and I told her I would let our NP Vina Dasen know she would be coming.

## 2024-12-11 ENCOUNTER — Ambulatory Visit: Admitting: Family Medicine

## 2024-12-11 ENCOUNTER — Encounter: Payer: Self-pay | Admitting: Family Medicine

## 2024-12-11 ENCOUNTER — Ambulatory Visit: Payer: Self-pay | Admitting: Family Medicine

## 2024-12-11 VITALS — BP 128/88 | HR 80 | Resp 15 | Ht 67.0 in | Wt 206.0 lb

## 2024-12-11 DIAGNOSIS — R200 Anesthesia of skin: Secondary | ICD-10-CM | POA: Diagnosis not present

## 2024-12-11 DIAGNOSIS — K50118 Crohn's disease of large intestine with other complication: Secondary | ICD-10-CM | POA: Diagnosis not present

## 2024-12-11 DIAGNOSIS — R29898 Other symptoms and signs involving the musculoskeletal system: Secondary | ICD-10-CM | POA: Diagnosis not present

## 2024-12-11 DIAGNOSIS — G932 Benign intracranial hypertension: Secondary | ICD-10-CM

## 2024-12-11 NOTE — Patient Instructions (Signed)
 Below is our plan:  We will continue to monitor headaches. I will talk to Dr Vear about concerns of numbness. May consider EMG/NCS. Make sure to update eye exam prior to visit with Dr Vear.    Please make sure you are staying well hydrated. I recommend 50-60 ounces daily. Well balanced diet and regular exercise encouraged. Consistent sleep schedule with 6-8 hours recommended.   Please continue follow up with care team as directed.   Follow up with Dr Vear as scheduled.   You may receive a survey regarding today's visit. I encourage you to leave honest feed back as I do use this information to improve patient care. Thank you for seeing me today!   GENERAL HEADACHE INFORMATION:   Natural supplements: Magnesium  Oxide or Magnesium  Glycinate 500 mg at bed (up to 800 mg daily) Coenzyme Q10 300 mg in AM Vitamin B2- 200 mg twice a day   Add 1 supplement at a time since even natural supplements can have undesirable side effects. You can sometimes buy supplements cheaper (especially Coenzyme Q10) at www.webmailguide.co.za or at [facility].  Migraine with aura: There is increased risk for stroke in women with migraine with aura and a contraindication for the combined contraceptive pill for use by women who have migraine with aura. The risk for women with migraine without aura is lower. However other risk factors like smoking are far more likely to increase stroke risk than migraine. There is a recommendation for no smoking and for the use of OCPs without estrogen such as progestogen only pills particularly for women with migraine with aura.SABRA People who have migraine headaches with auras may be 3 times more likely to have a stroke caused by a blood clot, compared to migraine patients who don't see auras. Women who take hormone-replacement therapy may be 30 percent more likely to suffer a clot-based stroke than women not taking medication containing estrogen. Other risk factors like smoking and high blood pressure  may be  much more important.    Vitamins and herbs that show potential:   Magnesium : Magnesium  (250 mg twice a day or 500 mg at bed) has a relaxant effect on smooth muscles such as blood vessels. Individuals suffering from frequent or daily headache usually have low magnesium  levels which can be increase with daily supplementation of 400-750 mg. Three trials found 40-90% average headache reduction  when used as a preventative. Magnesium  may help with headaches are aura, the best evidence for magnesium  is for migraine with aura is its thought to stop the cortical spreading depression we believe is the pathophysiology of migraine aura.Magnesium  also demonstrated the benefit in menstrually related migraine.  Magnesium  is part of the messenger system in the serotonin cascade and it is a good muscle relaxant.  It is also useful for constipation which can be a side effect of other medications used to treat migraine. Good sources include nuts, whole grains, and tomatoes. Side Effects: loose stool/diarrhea  Riboflavin (vitamin B 2) 200 mg twice a day. This vitamin assists nerve cells in the production of ATP a principal energy storing molecule.  It is necessary for many chemical reactions in the body.  There have been at least 3 clinical trials of riboflavin using 400 mg per day all of which suggested that migraine frequency can be decreased.  All 3 trials showed significant improvement in over half of migraine sufferers.  The supplement is found in bread, cereal, milk, meat, and poultry.  Most Americans get more riboflavin than the recommended  daily allowance, however riboflavin deficiency is not necessary for the supplements to help prevent headache. Side effects: energizing, green urine   Coenzyme Q10: This is present in almost all cells in the body and is critical component for the conversion of energy.  Recent studies have shown that a nutritional supplement of CoQ10 can reduce the frequency of migraine attacks  by improving the energy production of cells as with riboflavin.  Doses of 150 mg twice a day have been shown to be effective.   Melatonin: Increasing evidence shows correlation between melatonin secretion and headache conditions.  Melatonin supplementation has decreased headache intensity and duration.  It is widely used as a sleep aid.  Sleep is natures way of dealing with migraine.  A dose of 3 mg is recommended to start for headaches including cluster headache. Higher doses up to 15 mg has been reviewed for use in Cluster headache and have been used. The rationale behind using melatonin for cluster is that many theories regarding the cause of Cluster headache center around the disruption of the normal circadian rhythm in the brain.  This helps restore the normal circadian rhythm.   HEADACHE DIET: Foods and beverages which may trigger migraine Note that only 20% of headache patients are food sensitive. You will know if you are food sensitive if you get a headache consistently 20 minutes to 2 hours after eating a certain food. Only cut out a food if it causes headaches, otherwise you might remove foods you enjoy! What matters most for diet is to eat a well balanced healthy diet full of vegetables and low fat protein, and to not miss meals.   Chocolate, other sweets ALL cheeses except cottage and cream cheese Dairy products, yogurt, sour cream, ice cream Liver Meat extracts (Bovril, Marmite, meat tenderizers) Meats or fish which have undergone aging, fermenting, pickling or smoking. These include: Hotdogs,salami,Lox,sausage, mortadellas,smoked salmon, pepperoni, Pickled herring Pods of broad bean (English beans, Chinese pea pods, Italian (fava) beans, lima and navy beans Ripe avocado, ripe banana Yeast extracts or active yeast preparations such as Brewer's or Fleishman's (commercial bakes goods are permitted) Tomato based foods, pizza (lasagna, etc.)   MSG (monosodium glutamate) is disguised as  many things; look for these common aliases: Monopotassium glutamate Autolysed yeast Hydrolysed protein Sodium caseinate flavorings all natural preservatives Nutrasweet   Avoid all other foods that convincingly provoke headaches.   Resources: The Dizzy Bluford Aid Your Headache Diet, migrainestrong.com  https://zamora-andrews.com/   Caffeine and Migraine For patients that have migraine, caffeine intake more than 3 days per week can lead to dependency and increased migraine frequency. I would recommend cutting back on your caffeine intake as best you can. The recommended amount of caffeine is 200-300 mg daily, although migraine patients may experience dependency at even lower doses. While you may notice an increase in headache temporarily, cutting back will be helpful for headaches in the long run. For more information on caffeine and migraine, visit: https://americanmigrainefoundation.org/resource-library/caffeine-and-migraine/   Headache Prevention Strategies:   1. Maintain a headache diary; learn to identify and avoid triggers.  - This can be a simple note where you log when you had a headache, associated symptoms, and medications used - There are several smartphone apps developed to help track migraines: Migraine Buddy, Migraine Monitor, Curelator N1-Headache App   Common triggers include: Emotional triggers: Emotional/Upset family or friends Emotional/Upset occupation Business reversal/success Anticipation anxiety Crisis-serious Post-crisis periodNew job/position   Physical triggers: Vacation Day Weekend Strenuous Exercise High Altitude Location New Move  Menstrual Day Physical Illness Oversleep/Not enough sleep Weather changes Light: Photophobia or light sesnitivity treatment involves a balance between desensitization and reduction in overly strong input. Use dark polarized glasses outside, but not inside. Avoid bright or  fluorescent light, but do not dim environment to the point that going into a normally lit room hurts. Consider FL-41 tint lenses, which reduce the most irritating wavelengths without blocking too much light.  These can be obtained at axonoptics.com or theraspecs.com Foods: see list above.   2. Limit use of acute treatments (over-the-counter medications, triptans, etc.) to no more than 2 days per week or 10 days per month to prevent medication overuse headache (rebound headache).     3. Follow a regular schedule (including weekends and holidays): Don't skip meals. Eat a balanced diet. 8 hours of sleep nightly. Minimize stress. Exercise 30 minutes per day. Being overweight is associated with a 5 times increased risk of chronic migraine. Keep well hydrated and drink 6-8 glasses of water per day.   4. Initiate non-pharmacologic measures at the earliest onset of your headache. Rest and quiet environment. Relax and reduce stress. Breathe2Relax is a free app that can instruct you on    some simple relaxtion and breathing techniques. Http://Dawnbuse.com is a    free website that provides teaching videos on relaxation.  Also, there are  many apps that   can be downloaded for mindful relaxation.  An app called YOGA NIDRA will help walk you through mindfulness. Another app called Calm can be downloaded to give you a structured mindfulness guide with daily reminders and skill development. Headspace for guided meditation Mindfulness Based Stress Reduction Online Course: www.palousemindfulness.com Cold compresses.   5. Don't wait!! Take the maximum allowable dosage of prescribed medication at the first sign of migraine.   6. Compliance:  Take prescribed medication regularly as directed and at the first sign of a migraine.   7. Communicate:  Call your physician when problems arise, especially if your headaches change, increase in frequency/severity, or become associated with neurological symptoms (weakness,  numbness, slurred speech, etc.). Proceed to emergency room if you experience new or worsening symptoms or symptoms do not resolve, if you have new neurologic symptoms or if headache is severe, or for any concerning symptom.   8. Headache/pain management therapies: Consider various complementary methods, including medication, behavioral therapy, psychological counselling, biofeedback, massage therapy, acupuncture, dry needling, and other modalities.  Such measures may reduce the need for medications. Counseling for pain management, where patients learn to function and ignore/minimize their pain, seems to work very well.   9. Recommend changing family's attention and focus away from patient's headaches. Instead, emphasize daily activities. If first question of day is 'How are your headaches/Do you have a headache today?', then patient will constantly think about headaches, thus making them worse. Goal is to re-direct attention away from headaches, toward daily activities and other distractions.   10. Helpful Websites: www.AmericanHeadacheSociety.org patenthood.ch www.headaches.org tightmarket.nl www.achenet.org

## 2024-12-11 NOTE — Progress Notes (Signed)
 "   Chief Complaint  Patient presents with   iih    Rm2, alone, Pt is here for follow up of IIH     HISTORY OF PRESENT ILLNESS:  12/11/24 ALL:  Julie Ward returns for follow up for IIH. She was last seen 02/2023 and we discontinued Lasix  in the setting of family planning. She called 05/2023 wishing to restart but was unable to schedule follow up for labs and eval.   Since, she reports headaches have been stable. She may have 3-4 mild tension headaches per month. Most resolve spontaneously. May use Tylenol  if needed. She has not had a recent eye exam. She does describe some blurred vision with recent illness. She was hospitalized with Crohns's flare 10/2024. She reports losing 30lbs due to severe nausea/vomiting. She was found to have multiple electrolyte abnormalities including hypokalemia hyponatremia and hypocalcemia. B1 33.8. Vitamin D  < 6. She reports generalized numbness since. She has numbness from neck to feet, bilaterally. Some areas are more numb than others. She reports weakness of bilateral lower ext. She was referred to Dr Vear for consideration of NCS/EMG. She has an appt 01/15/2025. She continues to work with PT once a week. Using a Rolator for stability.   03/03/2023 ALL: Julie Ward returns for follow up for IIH. She continues furosemide  20mg  daily. She reports headaches have been well managed. She has had a few milder headaches over the past few weeks. She has taken Tylenol  once for abortive therapy. No vision loss. She did have an episode where she felt something was flying around her face but nothing was there. She is having to wear glasses more. She is planning to start IVF treatments soon. Eye exam scheduled tomorrow.   05/11/2022 ALL: Julie Ward returns for follow up for IIH. She continues furosemide  and tolerating well. She reports headaches are rare. She has blurred vision at times but denies loss of vision. She was seen by ophthalmology last month and reports exam was stable. No  significant improvement but no worsening. She is working on weight loss efforts. She is down 7 pounds since 12/2021. She had trouble tolerating 64 ounces of water and usually drinks about 24 ounces daily,   12/22/2021 ALL: Julie Ward is a 29 y.o. female here today for follow up for IIH. Opening pressure 32. Bilateral papilledema with ophthalmology exam. She was previously started on Diamox  250 mg, she has been reporting feeling sick on it and is unable to tolerate it. Dr. Vear advised trying furosemide  20 mg. At this time she denies having any headaches and only having occasional pressure behind the eyes in the mornings. No vision changes or vision loss. She is working on weight loss at this time and is interested in seeing health weight and wellness. She sees [facility] for her eye exams and will continue close follow up with them.   HISTORY (copied from Dr Duncan previous note)  I had the pleasure of seeing your patient, Julie Ward, at [facility] for neurologic consultation regarding her optic disc edema and concern about idiopathic intracranial hypertension.   She is a 29 year old woman who was found on routine eye exam to have mild optic nerve edema bilaterally.     She saw Dr. Joli for a routine eye exam (telemedicine with a technician live with the patient) who noted optic disc edema on routine eye exam.  OCT RNFL confirmed optic disc edema and no evidence of drusen.  She saw her ophthalmology 08/21/2021.  She was referred to  Dr. Grissom who confirmed the bilateral optic nerve edema, grade 1.   Visual acuity was 20/20.  Intraocular pressures were normal (16 OD and OS).  She denies any visual field issues.   She denies eye pain or frequent headache.  She denies pulsatile tinnitus.   Rarely, she feels lightheaded if she stands up fast.   No diplopia.      She is referred for further evaluation and treatment.   She used to weight 277 and no weighs 255.        She gets occasional mild headaches that resolve on their own.  She presented to the [facility] emergency room 05/01/2020 due to a severe headache lasting 4 days.  She had a CT scan at [facility].  05/01/2020.  It was read as showing no acute intracranial abnormality.  The pituitary is not mentioned on the report.     REVIEW OF SYSTEMS: Out of a complete 14 system review of symptoms, the patient complains only of the following symptoms, blurred vision, bilateral lower ext weakness, generalized numbenss and all other reviewed systems are negative.   ALLERGIES: Allergies  Allergen Reactions   Penicillins Shortness Of Breath, Nausea Only and Other (See Comments)    Made her sick/Childhood Allergy Has patient had a PCN reaction causing immediate rash, facial/tongue/throat swelling, SOB or lightheadedness with hypotension:yes Has patient had a PCN reaction causing severe rash involving mucus membranes or skin necrosis: No Has patient had a PCN reaction that required hospitalization: No Has patient had a PCN reaction occurring within the last 10 years:No .   Prednisone Hives   Fentanyl  Anxiety   Hydrocodone  Nausea And Vomiting   Metoclopramide  Other (See Comments)    Sweaty, felt unwell   Morphine  And Codeine Nausea And Vomiting and Other (See Comments)    Caused stomach to burn    Oxycontin  [Oxycodone  Hcl] Nausea And Vomiting   Valium  [Diazepam ] Other (See Comments)    Hyperactivity      HOME MEDICATIONS: Outpatient Medications Prior to Visit  Medication Sig Dispense Refill   Vitamin D , Ergocalciferol , (DRISDOL ) 1.25 MG (50000 UNIT) CAPS capsule Take 1 capsule (50,000 Units total) by mouth every 7 (seven) days for 7 doses. 7 capsule 0   [START ON 01/03/2025] Cholecalciferol  25 MCG (1000 UT) capsule Take 1 capsule (1,000 Units total) by mouth daily. (Patient not taking: Reported on 12/11/2024) 90 capsule 0   budesonide  (ENTOCORT EC ) 3 MG 24 hr capsule Take 3 capsules (9 mg total) by mouth every  morning.     ondansetron  (ZOFRAN -ODT) 4 MG disintegrating tablet Take 1 tablet (4 mg total) by mouth 3 (three) times daily before meals. (Patient taking differently: Take 4 mg by mouth See admin instructions. Dissolve 4 mg in the mouth three times a day before meals) 30 tablet 1   thiamine  (VITAMIN B1) 100 MG tablet Take 1 tablet (100 mg total) by mouth daily.     No facility-administered medications prior to visit.     PAST MEDICAL HISTORY: Past Medical History:  Diagnosis Date   Abdominal pain, recurrent    Allergy    Anxiety    Appendicitis    Colitis    Crohn's ileitis (HCC) 2014   Dysmenorrhea    GERD (gastroesophageal reflux disease)    not taking any medication   IIH (idiopathic intracranial hypertension)    I have been cleared of that since July 2025   Shortness of breath dyspnea    ith thyroid  problem  PAST SURGICAL HISTORY: Past Surgical History:  Procedure Laterality Date   APPENDECTOMY  07-14-11   laparoscopic    CHOLECYSTECTOMY  02/11/2012   Procedure: LAPAROSCOPIC CHOLECYSTECTOMY WITH INTRAOPERATIVE CHOLANGIOGRAM;  Surgeon: Lynwood MALVA Pina, MD;  Location: [facility] OR;  Service: General;  Laterality: N/A;   ESOPHAGOGASTRODUODENOSCOPY  01/28/2012   Procedure: ESOPHAGOGASTRODUODENOSCOPY (EGD);  Surgeon: Fairy VEAR Gaskins, MD;  Location: [facility] OR;  Service: Gastroenterology;  Laterality: N/A;   THYROID  LOBECTOMY Left 04/29/2015   Procedure: LEFT TOTAL THYROID  LOBECTOMY;  Surgeon: Lynwood Pina, MD;  Location: [facility] OR;  Service: General;  Laterality: Left;   WISDOM TOOTH EXTRACTION       FAMILY HISTORY: Family History  Problem Relation Age of Onset   Cancer Paternal Aunt        ovarian, kidney   Cancer Paternal Uncle        colon   Cancer Maternal Grandmother        breast    Hypertension Maternal Grandmother    Irritable bowel syndrome Maternal Grandmother    Heart disease Maternal Grandmother    Hypertension Paternal Grandmother    Hyperlipidemia Paternal Grandmother     Colon cancer Paternal Grandfather    Esophageal cancer Neg Hx    Rectal cancer Neg Hx    Stomach cancer Neg Hx      SOCIAL HISTORY: Social History   Socioeconomic History   Marital status: Single    Spouse name: Not on file   Number of children: 0   Years of education: 14   Highest education level: Not on file  Occupational History   Occupation: [employer]   Occupation: [employer]  Tobacco Use   Smoking status: Never   Smokeless tobacco: Never  Vaping Use   Vaping status: Never Used  Substance and Sexual Activity   Alcohol use: Yes    Comment: weekends   Drug use: No   Sexual activity: Never  Other Topics Concern   Not on file  Social History Narrative   Right handed   Caffeine use: coffee/soda (coffee 2-3 times per week, soda daily)   Social Drivers of Health   Tobacco Use: Low Risk (12/11/2024)   Patient History    Smoking Tobacco Use: Never    Smokeless Tobacco Use: Never    Passive Exposure: Not on file  Financial Resource Strain: Not on file  Food Insecurity: No Food Insecurity (11/09/2024)   Epic    Worried About Programme Researcher, Broadcasting/film/video in the Last Year: Never true    Ran Out of Food in the Last Year: Never true  Transportation Needs: No Transportation Needs (11/09/2024)   Epic    Lack of Transportation (Medical): No    Lack of Transportation (Non-Medical): No  Physical Activity: Not on file  Stress: Not on file  Social Connections: Unknown (03/26/2022)   Received from [facility]   Social Network    Social Network: Not on file  Intimate Partner Violence: Not At Risk (11/09/2024)   Epic    Fear of Current or Ex-Partner: No    Emotionally Abused: No    Physically Abused: No    Sexually Abused: No  Depression (PHQ2-9): Medium Risk (12/06/2024)   Depression (PHQ2-9)    PHQ-2 Score: 8  Alcohol Screen: Not on file  Housing: Low Risk (11/09/2024)   Epic    Unable to Pay for Housing in the Last Year: No    Number of Times Moved in the Last Year: 0  Homeless in the Last Year: No  Utilities: Not At Risk (11/09/2024)   Epic    Threatened with loss of utilities: No  Health Literacy: Not on file     PHYSICAL EXAM  Vitals:   12/11/24 1035  BP: 128/88  Pulse: 80  Resp: 15  Weight: 206 lb (93.4 kg)  Height: 5' 7 (1.702 m)     Body mass index is 32.26 kg/m.  Generalized: Well developed, in no acute distress  Cardiology: normal rate and rhythm, no murmur auscultated  Respiratory: clear to auscultation bilaterally    Neurological examination  Mentation: Alert oriented to time, place, history taking. Follows all commands speech and language fluent Cranial nerve II-XII: Pupils were equal round reactive to light. Extraocular movements were full, visual field were full on confrontational test. Facial sensation and strength were normal. Head turning and shoulder shrug  were normal and symmetric. Motor: The motor testing reveals 5 over 5 strength of all 4 extremities. Good symmetric motor tone is noted throughout.  Sensation: patient reports diffuse decreased sensation of body from neck to feet, increased sensitivity to pin prick stimulus at right lateral calf/ankle.  Gait: Able to stand without assistance, gait normal, using Rolator for stability DTR: normal and symmetric bilaterally in upper and lower extremities.    DIAGNOSTIC DATA (LABS, IMAGING, TESTING) - I reviewed patient records, labs, notes, testing and imaging myself where available.  Lab Results  Component Value Date   WBC 5.0 11/12/2024   HGB 10.6 (L) 11/12/2024   HCT 31.9 (L) 11/12/2024   MCV 90.1 11/12/2024   PLT 252 11/12/2024      Component Value Date/Time   NA 138 11/15/2024 0702   NA 138 05/11/2022 1030   K 3.9 11/15/2024 0702   CL 104 11/15/2024 0702   CO2 22 11/15/2024 0702   GLUCOSE 98 11/15/2024 0702   BUN <5 (L) 11/15/2024 0702   BUN 8 05/11/2022 1030   CREATININE 0.61 11/15/2024 0702   CREATININE 0.68 07/06/2013 1600   CALCIUM 9.1  11/15/2024 0702   PROT 6.9 11/10/2024 0501   PROT 7.6 05/11/2022 1030   ALBUMIN 3.3 (L) 11/10/2024 0501   ALBUMIN 4.2 05/11/2022 1030   AST 20 11/10/2024 0501   ALT 31 11/10/2024 0501   ALKPHOS 55 11/10/2024 0501   BILITOT 0.6 11/10/2024 0501   BILITOT 0.3 05/11/2022 1030   GFRNONAA >60 11/15/2024 0702   GFRAA >60 04/17/2019 0446   No results found for: CHOL, HDL, LDLCALC, LDLDIRECT, TRIG, CHOLHDL No results found for: YHAJ8R Lab Results  Component Value Date   VITAMINB12 687 11/13/2024   Lab Results  Component Value Date   TSH 3.550 11/10/2024        No data to display               No data to display           ASSESSMENT AND PLAN  29 y.o. year old female  has a past medical history of Abdominal pain, recurrent, Allergy, Anxiety, Appendicitis, Colitis, Crohn's ileitis (HCC) (2014), Dysmenorrhea, GERD (gastroesophageal reflux disease), IIH (idiopathic intracranial hypertension), and Shortness of breath dyspnea. here with    IIH (idiopathic intracranial hypertension)  Numbness  Weakness of both lower extremities  Crohn's disease of large intestine with other complication (HCC)  Julie Ward is doing well. No significant headaches. Continue close follow up with opthalmology for history of optic nerve edema. We have reviewed concerns of generalized numbness and lower extremity weakness following hospitalization for  Crohn's flare. She was significantly dehydrated with multiple electrolyte and vitamin deficiencies. MRI unremarkable. Neuro exam intact. She will continue to work with PT. Will discuss NCS/EMG with Dr Vear prior to his visit with her 01/2025. Follow up as scheduled.   No orders of the defined types were placed in this encounter.    No orders of the defined types were placed in this encounter.     Greig Forbes, MSN, FNP-C 12/11/2024, 2:54 PM  [facility] [address], [address] [place], [place] [address] [phone] "

## 2024-12-17 ENCOUNTER — Other Ambulatory Visit: Payer: Self-pay | Admitting: Family Medicine

## 2024-12-17 DIAGNOSIS — R200 Anesthesia of skin: Secondary | ICD-10-CM

## 2024-12-17 DIAGNOSIS — R29898 Other symptoms and signs involving the musculoskeletal system: Secondary | ICD-10-CM

## 2024-12-19 ENCOUNTER — Telehealth: Payer: Self-pay | Admitting: Internal Medicine

## 2024-12-19 ENCOUNTER — Ambulatory Visit: Admitting: Internal Medicine

## 2024-12-19 DIAGNOSIS — E519 Thiamine deficiency, unspecified: Secondary | ICD-10-CM

## 2024-12-19 DIAGNOSIS — R109 Unspecified abdominal pain: Secondary | ICD-10-CM

## 2024-12-19 DIAGNOSIS — K529 Noninfective gastroenteritis and colitis, unspecified: Secondary | ICD-10-CM

## 2024-12-19 DIAGNOSIS — E876 Hypokalemia: Secondary | ICD-10-CM

## 2024-12-19 DIAGNOSIS — R112 Nausea with vomiting, unspecified: Secondary | ICD-10-CM

## 2024-12-19 NOTE — Telephone Encounter (Signed)
 The following is asking to have a lab order to check her vitamin D , potassium and thiamine . Requesting a call back to further discuss.

## 2024-12-20 NOTE — Telephone Encounter (Signed)
 The pt has been advised and labs have been entered

## 2024-12-20 NOTE — Telephone Encounter (Signed)
 You could order a BMP, thiamine  and magnesium   I would tell her that it is too soon to recheck about D level is not going to move very quickly and it does not make sense to recheck that 1 until she completes her 50,000 units weekly course.

## 2024-12-20 NOTE — Telephone Encounter (Signed)
 Dr Avram are you ok with ordering the labs requested by the pt or should she follow up with PCP?

## 2025-01-15 ENCOUNTER — Institutional Professional Consult (permissible substitution): Admitting: Neurology

## 2025-02-20 ENCOUNTER — Encounter: Admitting: Neurology
